# Patient Record
Sex: Male | Born: 1950 | Race: White | Hispanic: No | Smoking: Never smoker
Health system: Southern US, Community
[De-identification: ages and names within clinical notes are randomized; demographics above are authoritative.]

## PROBLEM LIST (undated history)

## (undated) DIAGNOSIS — K7010 Alcoholic hepatitis without ascites: Secondary | ICD-10-CM

## (undated) DIAGNOSIS — R42 Dizziness and giddiness: Secondary | ICD-10-CM

## (undated) DIAGNOSIS — F419 Anxiety disorder, unspecified: Secondary | ICD-10-CM

## (undated) DIAGNOSIS — I471 Supraventricular tachycardia: Secondary | ICD-10-CM

## (undated) DIAGNOSIS — I10 Essential (primary) hypertension: Secondary | ICD-10-CM

## (undated) DIAGNOSIS — F1110 Opioid abuse, uncomplicated: Secondary | ICD-10-CM

## (undated) DIAGNOSIS — Z9289 Personal history of other medical treatment: Secondary | ICD-10-CM

## (undated) DIAGNOSIS — F329 Major depressive disorder, single episode, unspecified: Secondary | ICD-10-CM

## (undated) DIAGNOSIS — J410 Simple chronic bronchitis: Secondary | ICD-10-CM

## (undated) DIAGNOSIS — E8809 Other disorders of plasma-protein metabolism, not elsewhere classified: Secondary | ICD-10-CM

## (undated) DIAGNOSIS — K219 Gastro-esophageal reflux disease without esophagitis: Secondary | ICD-10-CM

## (undated) DIAGNOSIS — J309 Allergic rhinitis, unspecified: Secondary | ICD-10-CM

## (undated) DIAGNOSIS — Z8679 Personal history of other diseases of the circulatory system: Secondary | ICD-10-CM

## (undated) DIAGNOSIS — J4550 Severe persistent asthma, uncomplicated: Secondary | ICD-10-CM

## (undated) DIAGNOSIS — M109 Gout, unspecified: Secondary | ICD-10-CM

## (undated) DIAGNOSIS — N400 Enlarged prostate without lower urinary tract symptoms: Secondary | ICD-10-CM

## (undated) DIAGNOSIS — I517 Cardiomegaly: Secondary | ICD-10-CM

## (undated) DIAGNOSIS — R9389 Abnormal findings on diagnostic imaging of other specified body structures: Secondary | ICD-10-CM

## (undated) DIAGNOSIS — F1029 Alcohol dependence with unspecified alcohol-induced disorder: Secondary | ICD-10-CM

## (undated) DIAGNOSIS — R110 Nausea: Secondary | ICD-10-CM

## (undated) DIAGNOSIS — E876 Hypokalemia: Secondary | ICD-10-CM

## (undated) DIAGNOSIS — B1920 Unspecified viral hepatitis C without hepatic coma: Secondary | ICD-10-CM

## (undated) HISTORY — DX: Gout, unspecified: M10.9

## (undated) HISTORY — DX: Supraventricular tachycardia: I47.1

## (undated) HISTORY — DX: Nausea: R11.0

## (undated) HISTORY — DX: Other disorders of plasma-protein metabolism, not elsewhere classified: E88.09

## (undated) HISTORY — DX: Alcohol dependence with unspecified alcohol-induced disorder: F10.29

## (undated) HISTORY — DX: Opioid abuse, uncomplicated: F11.10

## (undated) HISTORY — DX: Allergic rhinitis, unspecified: J30.9

## (undated) HISTORY — DX: Personal history of other medical treatment: Z92.89

## (undated) HISTORY — DX: Anxiety disorder, unspecified: F41.9

## (undated) HISTORY — DX: Essential (primary) hypertension: I10

## (undated) HISTORY — PX: OTHER SURGICAL HISTORY: SHX169

## (undated) HISTORY — DX: Benign prostatic hyperplasia without lower urinary tract symptoms: N40.0

## (undated) HISTORY — PX: UPPER GI ENDOSCOPY: SHX6162

## (undated) HISTORY — DX: Hypokalemia: E87.6

## (undated) HISTORY — DX: Alcoholic hepatitis without ascites: K70.10

## (undated) HISTORY — DX: Major depressive disorder, single episode, unspecified: F32.9

## (undated) HISTORY — DX: Cardiomegaly: I51.7

## (undated) HISTORY — DX: Dizziness and giddiness: R42

## (undated) HISTORY — DX: Abnormal findings on diagnostic imaging of other specified body structures: R93.89

## (undated) HISTORY — DX: Unspecified viral hepatitis C without hepatic coma: B19.20

## (undated) HISTORY — DX: Simple chronic bronchitis: J41.0

## (undated) HISTORY — DX: Severe persistent asthma, uncomplicated: J45.50

## (undated) HISTORY — DX: Personal history of other diseases of the circulatory system: Z86.79

## (undated) HISTORY — DX: Gastro-esophageal reflux disease without esophagitis: K21.9

---

## 2004-08-09 ENCOUNTER — Ambulatory Visit: Payer: Self-pay | Admitting: Internal Medicine

## 2004-09-20 ENCOUNTER — Ambulatory Visit: Payer: Self-pay | Admitting: Internal Medicine

## 2004-10-01 ENCOUNTER — Ambulatory Visit: Payer: Self-pay | Admitting: Internal Medicine

## 2004-11-29 ENCOUNTER — Ambulatory Visit: Payer: Self-pay | Admitting: Internal Medicine

## 2005-02-22 ENCOUNTER — Ambulatory Visit: Payer: Self-pay | Admitting: Internal Medicine

## 2005-03-08 ENCOUNTER — Ambulatory Visit: Payer: Self-pay | Admitting: Internal Medicine

## 2005-08-09 ENCOUNTER — Ambulatory Visit: Payer: Self-pay | Admitting: Internal Medicine

## 2005-10-03 ENCOUNTER — Ambulatory Visit: Payer: Self-pay | Admitting: Internal Medicine

## 2005-10-13 ENCOUNTER — Ambulatory Visit: Payer: Self-pay | Admitting: Internal Medicine

## 2006-02-02 ENCOUNTER — Ambulatory Visit: Payer: Self-pay | Admitting: Internal Medicine

## 2006-07-14 ENCOUNTER — Ambulatory Visit: Payer: Self-pay | Admitting: Internal Medicine

## 2006-09-14 DIAGNOSIS — J4550 Severe persistent asthma, uncomplicated: Secondary | ICD-10-CM

## 2006-09-14 DIAGNOSIS — F329 Major depressive disorder, single episode, unspecified: Secondary | ICD-10-CM

## 2006-09-14 DIAGNOSIS — J309 Allergic rhinitis, unspecified: Secondary | ICD-10-CM

## 2006-09-14 DIAGNOSIS — F3289 Other specified depressive episodes: Secondary | ICD-10-CM

## 2006-09-14 HISTORY — DX: Other specified depressive episodes: F32.89

## 2006-09-14 HISTORY — DX: Major depressive disorder, single episode, unspecified: F32.9

## 2006-09-14 HISTORY — DX: Severe persistent asthma, uncomplicated: J45.50

## 2006-09-14 HISTORY — DX: Allergic rhinitis, unspecified: J30.9

## 2006-10-16 ENCOUNTER — Ambulatory Visit: Payer: Self-pay | Admitting: Internal Medicine

## 2006-10-24 ENCOUNTER — Ambulatory Visit: Payer: Self-pay | Admitting: Internal Medicine

## 2006-11-01 ENCOUNTER — Encounter (INDEPENDENT_AMBULATORY_CARE_PROVIDER_SITE_OTHER): Payer: Self-pay | Admitting: *Deleted

## 2006-11-01 LAB — CONVERTED CEMR LAB
Calcium: 9.1 mg/dL (ref 8.4–10.5)
Eosinophils Absolute: 0.2 10*3/uL (ref 0.0–0.6)
Eosinophils Relative: 3.1 % (ref 0.0–5.0)
GFR calc Af Amer: 114 mL/min
GFR calc non Af Amer: 94 mL/min
Glucose, Bld: 84 mg/dL (ref 70–99)
HDL: 58.1 mg/dL (ref >39.0)
Hemoglobin: 14.4 g/dL (ref 13.0–17.0)
Lymphocytes Relative: 25.7 % (ref 12.0–46.0)
MCV: 92.6 fL (ref 78.0–100.0)
Monocytes Absolute: 0.4 10*3/uL (ref 0.2–0.7)
Monocytes Relative: 6 % (ref 3.0–11.0)
Neutro Abs: 3.8 10*3/uL (ref 1.4–7.7)
Platelets: 265 10*3/uL (ref 150–400)
Potassium: 4.2 meq/L (ref 3.5–5.1)
Triglycerides: 83 mg/dL (ref 0–149)
WBC: 5.9 10*3/uL (ref 4.5–10.5)

## 2006-12-18 ENCOUNTER — Ambulatory Visit: Payer: Self-pay | Admitting: Internal Medicine

## 2007-04-20 ENCOUNTER — Encounter (INDEPENDENT_AMBULATORY_CARE_PROVIDER_SITE_OTHER): Payer: Self-pay | Admitting: *Deleted

## 2007-05-02 ENCOUNTER — Telehealth (INDEPENDENT_AMBULATORY_CARE_PROVIDER_SITE_OTHER): Payer: Self-pay | Admitting: *Deleted

## 2007-06-27 ENCOUNTER — Telehealth (INDEPENDENT_AMBULATORY_CARE_PROVIDER_SITE_OTHER): Payer: Self-pay | Admitting: *Deleted

## 2007-08-17 ENCOUNTER — Encounter: Payer: Self-pay | Admitting: Internal Medicine

## 2007-10-11 ENCOUNTER — Encounter: Admission: RE | Admit: 2007-10-11 | Discharge: 2007-10-11 | Payer: Self-pay | Admitting: Family Medicine

## 2007-11-07 ENCOUNTER — Telehealth (INDEPENDENT_AMBULATORY_CARE_PROVIDER_SITE_OTHER): Payer: Self-pay | Admitting: *Deleted

## 2012-03-23 ENCOUNTER — Ambulatory Visit (INDEPENDENT_AMBULATORY_CARE_PROVIDER_SITE_OTHER): Payer: Self-pay | Admitting: Family Medicine

## 2012-03-23 VITALS — BP 162/90 | HR 92 | Temp 98.2°F | Resp 18 | Ht 68.5 in | Wt 157.4 lb

## 2012-03-23 DIAGNOSIS — M25569 Pain in unspecified knee: Secondary | ICD-10-CM

## 2012-03-23 DIAGNOSIS — M25469 Effusion, unspecified knee: Secondary | ICD-10-CM

## 2012-03-23 NOTE — Patient Instructions (Addendum)
I am concerned that your knee may be infected. We are going to have you see [facility] today.  Please arrive by 2pm.    Contact [facility]  [address], [place] [address] Phone: [phone] Fax: [phone]

## 2012-03-23 NOTE — Progress Notes (Signed)
[facility] [address] [place] [address] [phone]  Date:  03/23/2012   Name:  Gabriel Ramos   DOB:  [birth year]   MRN:  [id number]  PCP:  Willow Ora, MD    Chief Complaint: Knee Pain   History of Present Illness:  Gabriel Ramos is a 61 y.o. very pleasant male patient who presents with the following:  About a week ago he was cleaning something on his hands and knees. He seemed to be ok, but he next day he noted some pain and a "catch" in his left knee, and then a couple of days later the knee swelled.  The knee is getting more stiff, and is painful at night.  Last night he had a fever of 101.5.  He does not note any other body aches or chills today.  Yesterday he did feel achy, so he took his temperature.  He took tylenol last night but no antipyretics so far today No prior history of problems with his knee.   He is generally healthy.   No history of DM.   He does not have an orthopedist.   His PCP is with [facility].   Patient Active Problem List  Diagnosis  . DEPRESSION  . ALLERGIC RHINITIS  . ASTHMA    Past Medical History  Diagnosis Date  . Allergy   . Asthma     Past Surgical History  Procedure Date  . Polyps remvoed from nose     History  Substance Use Topics  . Smoking status: Never Smoker   . Smokeless tobacco: Not on file  . Alcohol Use: Yes    Family History  Problem Relation Age of Onset  . Heart murmur Mother   . Heart disease Father   . Heart disease Brother     Allergies  Allergen Reactions  . Aspirin     REACTION: ASTHMA  . Penicillins Swelling    Makes lips and tongue swell up     Medication list has been reviewed and updated.  Current Outpatient Prescriptions on File Prior to Visit  Medication Sig Dispense Refill  . albuterol (PROVENTIL) (2.5 MG/3ML) 0.083% nebulizer solution Take 2.5 mg by nebulization every 6 (six) hours as needed.      . beclomethasone (QVAR) 40 MCG/ACT inhaler Inhale 2 puffs  into the lungs 2 (two) times daily.      Marland Kitchen FLUoxetine (PROZAC) 10 MG capsule Take 10 mg by mouth daily.      . verapamil (CALAN) 40 MG tablet Take 40 mg by mouth 2 (two) times daily.        Review of Systems:  As per HPI- otherwise negative.   Physical Examination: Filed Vitals:   03/23/12 1202  BP: 162/90  Pulse: 92  Temp: 98.2 F (36.8 C)  Resp: 18   Filed Vitals:   03/23/12 1202  Height: 5' 8.5" (1.74 m)  Weight: 157 lb 6.4 oz (71.396 kg)   Body mass index is 23.58 kg/(m^2). Ideal Body Weight: Weight in (lb) to have BMI = 25: 166.5   GEN: WDWN, NAD, Non-toxic, A & O x 3, well- appearing HEENT: Atraumatic, Normocephalic. Ears and Nose: No external deformity. CV: RRR, No M/G/R. No JVD. No thrill. No extra heart sounds. PULM: CTA B, no wheezes, crackles, rhonchi. No retractions. No resp. distress. No accessory muscle use. EXTR: No c/c/e.  Left knee is warm and swollen.  Small joint effusion.  The knee is tender to anterior palpation.  Joint is stable to varus and valgus stress.  No visible injury to the skin.  Tenderness to flexion and extension of the knee joint.   NEURO favoring left knee PSYCH: Normally interactive. Conversant. Not depressed or anxious appearing.  Calm demeanor.    Assessment and Plan: 1. Knee pain   2. Knee swelling    I am concerned that Gabriel Ramos may have a left knee joint infection.  Will send to [facility] ortho who kindly agreed to see him today at Maurine Simmering, MD

## 2012-03-28 ENCOUNTER — Encounter: Payer: Self-pay | Admitting: Radiology

## 2012-03-28 DIAGNOSIS — M109 Gout, unspecified: Secondary | ICD-10-CM

## 2012-03-28 HISTORY — DX: Gout, unspecified: M10.9

## 2013-09-18 ENCOUNTER — Ambulatory Visit: Payer: Self-pay | Admitting: Interventional Cardiology

## 2013-10-29 ENCOUNTER — Ambulatory Visit: Payer: Self-pay | Admitting: Interventional Cardiology

## 2014-03-21 ENCOUNTER — Ambulatory Visit
Admission: RE | Admit: 2014-03-21 | Discharge: 2014-03-21 | Disposition: A | Discharge status: Home / Self Care | Payer: BC Managed Care – PPO | Source: Ambulatory Visit | Attending: Family Medicine | Admitting: Family Medicine

## 2014-03-21 ENCOUNTER — Other Ambulatory Visit: Payer: Self-pay | Admitting: Family Medicine

## 2014-03-21 DIAGNOSIS — R509 Fever, unspecified: Secondary | ICD-10-CM

## 2014-03-21 DIAGNOSIS — R0989 Other specified symptoms and signs involving the circulatory and respiratory systems: Secondary | ICD-10-CM

## 2014-03-21 DIAGNOSIS — R05 Cough: Secondary | ICD-10-CM

## 2014-03-21 DIAGNOSIS — R059 Cough, unspecified: Secondary | ICD-10-CM

## 2015-10-22 ENCOUNTER — Encounter (HOSPITAL_COMMUNITY): Payer: Self-pay | Admitting: *Deleted

## 2015-10-22 ENCOUNTER — Emergency Department (HOSPITAL_COMMUNITY): Payer: 59

## 2015-10-22 ENCOUNTER — Emergency Department (HOSPITAL_COMMUNITY)
Admission: EM | Admit: 2015-10-22 | Discharge: 2015-10-22 | Disposition: A | Discharge status: Home / Self Care | Payer: 59 | Attending: Emergency Medicine | Admitting: Emergency Medicine

## 2015-10-22 DIAGNOSIS — I10 Essential (primary) hypertension: Secondary | ICD-10-CM | POA: Insufficient documentation

## 2015-10-22 DIAGNOSIS — R1033 Periumbilical pain: Secondary | ICD-10-CM | POA: Diagnosis not present

## 2015-10-22 DIAGNOSIS — J45909 Unspecified asthma, uncomplicated: Secondary | ICD-10-CM | POA: Insufficient documentation

## 2015-10-22 DIAGNOSIS — R209 Unspecified disturbances of skin sensation: Secondary | ICD-10-CM | POA: Diagnosis not present

## 2015-10-22 DIAGNOSIS — R0602 Shortness of breath: Secondary | ICD-10-CM | POA: Diagnosis not present

## 2015-10-22 DIAGNOSIS — Z79899 Other long term (current) drug therapy: Secondary | ICD-10-CM | POA: Diagnosis not present

## 2015-10-22 DIAGNOSIS — R55 Syncope and collapse: Secondary | ICD-10-CM | POA: Diagnosis present

## 2015-10-22 DIAGNOSIS — Z792 Long term (current) use of antibiotics: Secondary | ICD-10-CM | POA: Insufficient documentation

## 2015-10-22 DIAGNOSIS — E876 Hypokalemia: Secondary | ICD-10-CM | POA: Insufficient documentation

## 2015-10-22 DIAGNOSIS — R05 Cough: Secondary | ICD-10-CM

## 2015-10-22 DIAGNOSIS — R202 Paresthesia of skin: Secondary | ICD-10-CM

## 2015-10-22 DIAGNOSIS — R059 Cough, unspecified: Secondary | ICD-10-CM

## 2015-10-22 DIAGNOSIS — Z7952 Long term (current) use of systemic steroids: Secondary | ICD-10-CM | POA: Insufficient documentation

## 2015-10-22 LAB — HEPATIC FUNCTION PANEL
ALT: 17 U/L (ref 17–63)
AST: 33 U/L (ref 15–41)
Albumin: 3.5 g/dL (ref 3.5–5.0)
Alkaline Phosphatase: 92 U/L (ref 38–126)
Bilirubin, Direct: 0.3 mg/dL (ref 0.1–0.5)
Indirect Bilirubin: 0.9 mg/dL (ref 0.3–0.9)
Total Bilirubin: 1.2 mg/dL (ref 0.3–1.2)
Total Protein: 5.8 g/dL — ABNORMAL LOW (ref 6.5–8.1)

## 2015-10-22 LAB — MAGNESIUM: Magnesium: 1.4 mg/dL — ABNORMAL LOW (ref 1.7–2.4)

## 2015-10-22 LAB — CBC
HCT: 41.2 % (ref 39.0–52.0)
Hemoglobin: 14.8 g/dL (ref 13.0–17.0)
MCH: 33.9 pg (ref 26.0–34.0)
MCHC: 35.9 g/dL (ref 30.0–36.0)
MCV: 94.5 fL (ref 78.0–100.0)
PLATELETS: 299 10*3/uL (ref 150–400)
RBC: 4.36 MIL/uL (ref 4.22–5.81)
RDW: 12.6 % (ref 11.5–15.5)
WBC: 6.8 10*3/uL (ref 4.0–10.5)

## 2015-10-22 LAB — BASIC METABOLIC PANEL
Anion gap: 9 (ref 5–15)
CALCIUM: 8.7 mg/dL — AB (ref 8.9–10.3)
CO2: 27 mmol/L (ref 22–32)
CREATININE: 0.6 mg/dL — AB (ref 0.61–1.24)
Chloride: 99 mmol/L — ABNORMAL LOW (ref 101–111)
GFR calc Af Amer: >60 mL/min (ref >60)
Glucose, Bld: 103 mg/dL — ABNORMAL HIGH (ref 65–99)
Potassium: 2.7 mmol/L — CL (ref 3.5–5.1)
SODIUM: 135 mmol/L (ref 135–145)

## 2015-10-22 LAB — I-STAT TROPONIN, ED
TROPONIN I, POC: 0 ng/mL (ref 0.00–0.08)
Troponin i, poc: 0.32 ng/mL (ref 0.00–0.08)

## 2015-10-22 LAB — URINALYSIS, ROUTINE W REFLEX MICROSCOPIC
BILIRUBIN URINE: NEGATIVE
GLUCOSE, UA: NEGATIVE mg/dL
Hgb urine dipstick: NEGATIVE
KETONES UR: NEGATIVE mg/dL
LEUKOCYTES UA: NEGATIVE
NITRITE: NEGATIVE
PROTEIN: NEGATIVE mg/dL
Specific Gravity, Urine: 1.009 (ref 1.005–1.030)
pH: 8.5 — ABNORMAL HIGH (ref 5.0–8.0)

## 2015-10-22 LAB — BRAIN NATRIURETIC PEPTIDE: B Natriuretic Peptide: 203 pg/mL — ABNORMAL HIGH (ref 0.0–100.0)

## 2015-10-22 LAB — TROPONIN I

## 2015-10-22 LAB — CBG MONITORING, ED: Glucose-Capillary: 85 mg/dL (ref 65–99)

## 2015-10-22 LAB — LIPASE, BLOOD: Lipase: 33 U/L (ref 11–51)

## 2015-10-22 MED ORDER — IOPAMIDOL (ISOVUE-370) INJECTION 76%: 100.0000 mL | Freq: Once | INTRAVENOUS | Status: DC | PRN

## 2015-10-22 MED ORDER — POTASSIUM CHLORIDE ER 10 MEQ PO TBCR: 10.0000 meq | EXTENDED_RELEASE_TABLET | Freq: Every day | ORAL | Status: DC

## 2015-10-22 MED ORDER — DOXYCYCLINE HYCLATE 100 MG PO TABS
100.0000 mg | ORAL_TABLET | Freq: Once | ORAL | Status: AC
  Administered 2015-10-22: 100 mg via ORAL
  Filled 2015-10-22: qty 1

## 2015-10-22 MED ORDER — DOXYCYCLINE HYCLATE 100 MG PO CAPS: 100.0000 mg | ORAL_CAPSULE | Freq: Two times a day (BID) | ORAL | Status: DC

## 2015-10-22 MED ORDER — POTASSIUM CHLORIDE CRYS ER 20 MEQ PO TBCR
40.0000 meq | EXTENDED_RELEASE_TABLET | Freq: Once | ORAL | Status: AC
  Administered 2015-10-22: 40 meq via ORAL
  Filled 2015-10-22: qty 2

## 2015-10-22 MED ORDER — MAGNESIUM SULFATE 2 GM/50ML IV SOLN
2.0000 g | Freq: Once | INTRAVENOUS | Status: AC
  Administered 2015-10-22: 2 g via INTRAVENOUS
  Filled 2015-10-22: qty 50

## 2015-10-22 MED ORDER — PREDNISONE 20 MG PO TABS: 60.0000 mg | ORAL_TABLET | Freq: Every day | ORAL | Status: DC

## 2015-10-22 MED ORDER — ONDANSETRON HCL 4 MG PO TABS: 4.0000 mg | ORAL_TABLET | Freq: Four times a day (QID) | ORAL | Status: DC

## 2015-10-22 MED ORDER — SODIUM CHLORIDE 0.9 % IV BOLUS (SEPSIS)
1000.0000 mL | Freq: Once | INTRAVENOUS | Status: AC
  Administered 2015-10-22: 1000 mL via INTRAVENOUS

## 2015-10-22 MED ORDER — POTASSIUM CHLORIDE 10 MEQ/100ML IV SOLN
10.0000 meq | INTRAVENOUS | Status: AC
  Administered 2015-10-22 (×3): 10 meq via INTRAVENOUS
  Filled 2015-10-22 (×3): qty 100

## 2015-10-22 NOTE — ED Provider Notes (Signed)
CSN: [id number]     Arrival date & time 10/22/15  1305 History   First MD Initiated Contact with Patient 10/22/15 [phone]     Chief Complaint  Patient presents with  . Loss of Consciousness     (Consider location/radiation/quality/duration/timing/severity/associated sxs/prior Treatment) HPI Comments: Patient is a 65 year old male with history of asthma, GERD, hypertension, SVT presents with syncope and change in EKG sent from PCP. Patient states he had an episode of shortness of breath and wheezing this morning which made him leave work. The patient went to his PCP who did an EKG. The patient has had episodes of numbness in his hands and face for the past few weeks. Patient has had episodes of nausea, vomiting, diarrhea that lasts up to 6 hours in the mornings for the past 6+ months. This is being worked up by his PCP and gastroenterologist. The patient had a negative CT scan at [facility] in January. The patient has never had colonoscopy or endoscopy. The patient reports associated "burning home in his stomach. "The patient has also had associated 30 pound weight loss in the past year, chills, night sweats, but no fevers. Patient denies bloody or black stools. Patient also has associated generalized fatigue and states he feels cold all the time.  Patient is a 65 y.o. male presenting with syncope. The history is provided by the patient.  Loss of Consciousness Associated symptoms: shortness of breath   Associated symptoms: no chest pain, no fever, no headaches, no nausea and no vomiting     Past Medical History  Diagnosis Date  . Allergy   . Asthma   . Hypertension   . GERD (gastroesophageal reflux disease)   . SVT (supraventricular tachycardia) ([place])     2013,echo Var[place]asi NI LV EF ,mild MR: nl stress cl 3/13   Past Surgical History  Procedure Laterality Date  . Polyps remvoed from nose     Family History  Problem Relation Age of Onset  . Heart murmur Mother   . Heart disease  Father   . Heart disease Brother    Social History  Substance Use Topics  . Smoking status: Never Smoker   . Smokeless tobacco: None  . Alcohol Use: 1.8 oz/week    3 Cans of beer per week     Comment: smirnoffs    Review of Systems  Constitutional: Positive for chills and unexpected weight change. Negative for fever.  HENT: Negative for facial swelling and sore throat.   Respiratory: Positive for shortness of breath and wheezing.   Cardiovascular: Positive for syncope. Negative for chest pain.  Gastrointestinal: Negative for nausea, vomiting, abdominal pain and blood in stool.  Genitourinary: Negative for dysuria.  Musculoskeletal: Negative for back pain.  Skin: Negative for rash and wound.  Neurological: Negative for headaches.  Psychiatric/Behavioral: The patient is not nervous/anxious.       Allergies  Aspirin and Penicillins  Home Medications   Prior to Admission medications   Medication Sig Start Date End Date Taking? Authorizing Provider  albuterol (PROVENTIL HFA;VENTOLIN HFA) 108 (90 Base) MCG/ACT inhaler Inhale 2 puffs into the lungs every 6 (six) hours as needed for wheezing or shortness of breath.   Yes Historical Provider, MD  allopurinol (ZYLOPRIM) 100 MG tablet Take 100 mg by mouth daily. 09/14/15  Yes Historical Provider, MD  FLUoxetine (PROZAC) 20 MG capsule Take 20 mg by mouth daily.   Yes Historical Provider, MD  metoprolol succinate (TOPROL-XL) 50 MG 24 hr tablet Take 50 mg  by mouth daily. 09/14/15  Yes Historical Provider, MD  montelukast (SINGULAIR) 10 MG tablet Take 10 mg by mouth daily. 09/14/15  Yes Historical Provider, MD  verapamil (CALAN-SR) 180 MG CR tablet Take 360 mg by mouth daily. 09/14/15  Yes Historical Provider, MD  beclomethasone (QVAR) 40 MCG/ACT inhaler Inhale 2 puffs into the lungs 2 (two) times daily. Reported on 10/22/2015    Historical Provider, MD  doxycycline (VIBRAMYCIN) 100 MG capsule Take 1 capsule (100 mg total) by mouth 2 (two) times  daily. 10/22/15   Becker Christopher M Keedan Sample, PA-C  ondansetron (ZOFRAN) 4 MG tablet Take 1 tablet (4 mg total) by mouth every 6 (six) hours. 10/22/15   Frederica Kuster, PA-C  potassium chloride (K-DUR) 10 MEQ tablet Take 1 tablet (10 mEq total) by mouth daily. 10/22/15   Frederica Kuster, PA-C  predniSONE (DELTASONE) 20 MG tablet Take 3 tablets (60 mg total) by mouth daily. 10/22/15   Charlsie Fleeger M Maximillion Gill, PA-C   BP 160/103 mmHg  Pulse 80  Temp(Src) 98.4 F (36.9 C) (Oral)  Resp 15  SpO2 99% Physical Exam  Constitutional: He appears well-developed and well-nourished. No distress.  HENT:  Head: Normocephalic and atraumatic.  Mouth/Throat: Oropharynx is clear and moist. No oropharyngeal exudate.  Eyes: Conjunctivae are normal. Pupils are equal, round, and reactive to light. Right eye exhibits no discharge. Left eye exhibits no discharge. No scleral icterus.  Neck: Normal range of motion. Neck supple. No thyromegaly present.  Cardiovascular: Normal rate, regular rhythm, normal heart sounds and intact distal pulses.  Exam reveals no gallop and no friction rub.   No murmur heard. Pulmonary/Chest: Effort normal. No stridor. No respiratory distress. He has wheezes (diffuse expiratory). He has no rales. He exhibits no tenderness.  Abdominal: Soft. Bowel sounds are normal. He exhibits no distension. There is tenderness in the periumbilical area. There is no rebound, no guarding, no tenderness at McBurney's point and negative Murphy's sign.    Patient is tender superior to periumbilical area where patient states he has a hernia; cavitated abdomen  Musculoskeletal: He exhibits no edema.  Lymphadenopathy:    He has no cervical adenopathy.  Neurological: He is alert. Coordination normal.  CN 3-12 intact; normal sensation throughout; 5/5 strength in all 4 extremities; equal bilateral grip strength; no ataxia on finger to nose  Skin: Skin is warm and dry. No rash noted. He is not diaphoretic. No pallor.  Psychiatric: He  has a normal mood and affect.  Nursing note and vitals reviewed.   ED Course  Procedures (including critical care time) Labs Review Labs Reviewed  BASIC METABOLIC PANEL - Abnormal; Notable for the following:    Potassium 2.7 (*)    Chloride 99 (*)    Glucose, Bld 103 (*)    BUN <5 (*)    Creatinine, Ser 0.60 (*)    Calcium 8.7 (*)    All other components within normal limits  URINALYSIS, ROUTINE W REFLEX MICROSCOPIC (NOT AT [facility]) - Abnormal; Notable for the following:    pH 8.5 (*)    All other components within normal limits  MAGNESIUM - Abnormal; Notable for the following:    Magnesium 1.4 (*)    All other components within normal limits  HEPATIC FUNCTION PANEL - Abnormal; Notable for the following:    Total Protein 5.8 (*)    All other components within normal limits  BRAIN NATRIURETIC PEPTIDE - Abnormal; Notable for the following:    B Natriuretic Peptide 203.0 (*)  All other components within normal limits  I-STAT TROPOININ, ED - Abnormal; Notable for the following:    Troponin i, poc 0.32 (*)    All other components within normal limits  CBC  LIPASE, BLOOD  TROPONIN I  CBG MONITORING, ED  Randolm Idol, ED    Imaging Review Dg Chest 2 View  10/22/2015  CLINICAL DATA:  Shortness of breath and wheezing. EXAM: CHEST  2 VIEW COMPARISON:  06/22/2015 FINDINGS: The heart size and mediastinal contours are within normal limits. Calcified granulomas are noted in the left lung. No airspace consolidation. Healed right clavicle fracture identified. IMPRESSION: No active cardiopulmonary disease. Electronically Signed   By: Kerby Moors M.D.   On: 10/22/2015 16:15   Ct Angio Chest Pe W/cm &/or Wo Cm  10/22/2015  CLINICAL DATA:  Shortness of breath, dizziness, syncope on Saturday EXAM: CT ANGIOGRAPHY CHEST WITH CONTRAST TECHNIQUE: Multidetector CT imaging of the chest was performed using the standard protocol during bolus administration of intravenous contrast. Multiplanar CT  image reconstructions and MIPs were obtained to evaluate the vascular anatomy. CONTRAST:  100 cc Isovue COMPARISON:  10/11/2007 FINDINGS: Mediastinum/Lymph Nodes: Images of the thoracic inlet are unremarkable. No mediastinal hematoma or adenopathy. Central airways are patent. No aortic aneurysm. Heart size within normal limits. No pericardial effusion. The study is of excellent technical quality. No pulmonary embolus is noted. Lungs/Pleura: No pleural thickening or pleural effusion. Stable bilateral apical pleural parenchymal scarring. No segmental infiltrate or pulmonary edema. Small calcified granuloma in left lower lobe axial image 49. Axial image 49 and 50 there is small tree-in-bud opacity and mucous plugging with subtle bronchial thickening in left lower lobe posterior medially. Mild bronchitis or inflammation/infiltrate cannot be excluded. No pneumothorax. There is no pulmonary edema. Upper abdomen: The visualized upper abdomen shows no adrenal gland mass. Visualized pancreas is unremarkable. Visualized spleen and liver is unremarkable. Musculoskeletal: No destructive bony lesions are noted. Sagittal images of the spine shows mild degenerative changes thoracic spine. Review of the MIP images confirms the above findings. IMPRESSION: 1. No pulmonary embolus is noted. 2. No mediastinal hematoma or adenopathy. 3. Axial image 49 and 50 there is small tree-in-bud opacity and mucous plugging with subtle bronchial thickening in left lower lobe posterior medially. Mild bronchitis or inflammation/infiltrate cannot be excluded. No segmental infiltrate or pulmonary edema. 4. Mild degenerative changes thoracic spine. Electronically Signed   By: Lahoma Crocker M.D.   On: 10/22/2015 17:18   I have personally reviewed and evaluated these images and lab results as part of my medical decision-making.   EKG Interpretation   Date/Time:  Thursday Oct 22 2015 13:32:07 EDT Ventricular Rate:  87 PR Interval:  158 QRS Duration:  95 QT Interval:  424 QTC Calculation: 510 R Axis:   -55 Text Interpretation:  Sinus rhythm Right atrial enlargement Left axis  deviation Low voltage, extremity leads Abnormal R-wave progression, early  transition Prolonged QT interval Baseline wander in lead(s) V2 No previous  ECGs available Confirmed by [place] ([id number]) on 10/22/2015  3:43:58 PM      MDM   EKG shows NSR, rate of trauma management, left axis deviation, abnormal R-wave progression, prolonged QT interval. Initial false i-STAT troponin of 0.32. Troponin I drawn and resulted 0.0. Repeat i-STAT troponin resulted 0.00. BMP shows potassium 2.7, chloride 99, BUN less than 5, creatinine 0.60, calcium 8.7. Magnesium 1.4. CBC unremarkable. Lipase 33. Hepatic function panel showed protein 5.8. BNP 203.0. CXR shows no active cardiopulmonary disease. CTA shows no  pulmonary embolus; no mediastinal hematoma or adenopathy; small tree-in-bud opacity and mucous plugging with subtle bronchial thickening in the left lower lobe posterior medially, mild bronchitis or limitation/infiltrate cannot be excluded; no segmental infiltrate or pulmonary edema. We replaced potassium and gave fluids in ED. Patient discharged with doxycycline to treat possible pneumonia, prednisone, oral potassium, Zofran for nausea/vomiting. Patient to follow up with PCP and gastroenterology for continued evaluation of nausea/vomiting. Patient also evaluated by Dr. Billy Fischer who is in agreement with plan. Patient discharged in satisfactory condition.  Final diagnoses:  Paresthesia of both hands  Hypokalemia  Shortness of breath  Cough       Frederica Kuster, PA-C 10/24/15 0123  Gareth Morgan, MD 10/28/15 1402

## 2015-10-22 NOTE — ED Notes (Signed)
Schlossman, EDP and Law, PA made aware of patient troponin result.

## 2015-10-22 NOTE — ED Notes (Signed)
Pt sent by PCP, Rankins, c/o weakness, wheezing, and n/v x 3 months.  Pt reported 30lbs weight loss x 3 months.  PCP reports lungs were clear after a neb treatment.  PCP noted EKG changes compared to EKG from March.  Also, Pt began c/o L side facial and arm numbness during visit, but sts it resolved prior to leaving.  Hx of asthma, SVT, anxiety and ETOH abuse.

## 2015-10-22 NOTE — ED Notes (Signed)
Pt reports he went to see his doctor today and was instructed to come to the ED d/t changed in his EKG.  Pt also reports SOB and feeling dizzy.  Pt also reports syncopal episode Saturday.  Pt denies any cp at this time.

## 2015-10-22 NOTE — Discharge Instructions (Signed)
Medications: Doxycycline, potassium, Zofran, prednisone  Treatment: Take doxycycline as prescribed for 1 week. Take prednisone as prescribed for 5 days. Take Zofran every 6 hours as needed for nausea and vomiting. Take potassium once daily for 10 days. Have her potassium rechecked by her primary care provider in 2-3 days.  Follow-up: Please follow-up with your primary care provider in 2-3 days for follow-up and recheck of your potassium and further evaluation of your chronic symptoms. Please return to emergency department if you develop any new or worsening symptoms.   Shortness of Breath Shortness of breath means you have trouble breathing. It could also mean that you have a medical problem. You should get immediate medical care for shortness of breath. CAUSES   Not enough oxygen in the air such as with high altitudes or a smoke-filled room.  Certain lung diseases, infections, or problems.  Heart disease or conditions, such as angina or heart failure.  Low red blood cells (anemia).  Poor physical fitness, which can cause shortness of breath when you exercise.  Chest or back injuries or stiffness.  Being overweight.  Smoking.  Anxiety, which can make you feel like you are not getting enough air. DIAGNOSIS  Serious medical problems can often be found during your physical exam. Tests may also be done to determine why you are having shortness of breath. Tests may include:  Chest X-rays.  Lung function tests.  Blood tests.  An electrocardiogram (ECG).  An ambulatory electrocardiogram. An ambulatory ECG records your heartbeat patterns over a 24-hour period.  Exercise testing.  A transthoracic echocardiogram (TTE). During echocardiography, sound waves are used to evaluate how blood flows through your heart.  A transesophageal echocardiogram (TEE).  Imaging scans. Your health care provider may not be able to find a cause for your shortness of breath after your exam. In this  case, it is important to have a follow-up exam with your health care provider as directed.  TREATMENT  Treatment for shortness of breath depends on the cause of your symptoms and can vary greatly. HOME CARE INSTRUCTIONS   Do not smoke. Smoking is a common cause of shortness of breath. If you smoke, ask for help to quit.  Avoid being around chemicals or things that may bother your breathing, such as paint fumes and dust.  Rest as needed. Slowly resume your usual activities.  If medicines were prescribed, take them as directed for the full length of time directed. This includes oxygen and any inhaled medicines.  Keep all follow-up appointments as directed by your health care provider. SEEK MEDICAL CARE IF:   Your condition does not improve in the time expected.  You have a hard time doing your normal activities even with rest.  You have any new symptoms. SEEK IMMEDIATE MEDICAL CARE IF:   Your shortness of breath gets worse.  You feel light-headed, faint, or develop a cough not controlled with medicines.  You start coughing up blood.  You have pain with breathing.  You have chest pain or pain in your arms, shoulders, or abdomen.  You have a fever.  You are unable to walk up stairs or exercise the way you normally do. MAKE SURE YOU:  Understand these instructions.  Will watch your condition.  Will get help right away if you are not doing well or get worse.   This information is not intended to replace advice given to you by your health care provider. Make sure you discuss any questions you have with your health care  provider.   Document Released: 02/15/2001 Document Revised: 05/28/2013 Document Reviewed: 08/08/2011 Elsevier Interactive Patient Education [employer].

## 2015-10-22 NOTE — Progress Notes (Signed)
Patient confirms he has [employer]. Patient reports he usually sees Dr. Rex Kras of [facility].  He reports Dr. Radene Ou was the first available to see today.

## 2016-04-18 ENCOUNTER — Encounter: Payer: Self-pay | Admitting: Cardiology

## 2016-05-27 ENCOUNTER — Institutional Professional Consult (permissible substitution): Payer: 59 | Admitting: Internal Medicine

## 2016-09-15 ENCOUNTER — Ambulatory Visit (INDEPENDENT_AMBULATORY_CARE_PROVIDER_SITE_OTHER): Payer: BLUE CROSS/BLUE SHIELD | Admitting: Internal Medicine

## 2016-09-15 ENCOUNTER — Other Ambulatory Visit (INDEPENDENT_AMBULATORY_CARE_PROVIDER_SITE_OTHER): Payer: BLUE CROSS/BLUE SHIELD

## 2016-09-15 ENCOUNTER — Encounter: Payer: Self-pay | Admitting: Internal Medicine

## 2016-09-15 VITALS — BP 124/64 | HR 72 | Ht 67.5 in | Wt 133.8 lb

## 2016-09-15 DIAGNOSIS — I10 Essential (primary) hypertension: Secondary | ICD-10-CM

## 2016-09-15 DIAGNOSIS — R9389 Abnormal findings on diagnostic imaging of other specified body structures: Secondary | ICD-10-CM

## 2016-09-15 DIAGNOSIS — J4550 Severe persistent asthma, uncomplicated: Secondary | ICD-10-CM | POA: Diagnosis not present

## 2016-09-15 DIAGNOSIS — R938 Abnormal findings on diagnostic imaging of other specified body structures: Secondary | ICD-10-CM | POA: Diagnosis not present

## 2016-09-15 LAB — CBC WITH DIFFERENTIAL/PLATELET
BASOS PCT: 0.2 % (ref 0.0–3.0)
Basophils Absolute: 0 10*3/uL (ref 0.0–0.1)
EOS ABS: 0 10*3/uL (ref 0.0–0.7)
EOS PCT: 0 % (ref 0.0–5.0)
HEMATOCRIT: 41.8 % (ref 39.0–52.0)
Hemoglobin: 14 g/dL (ref 13.0–17.0)
LYMPHS PCT: 8.7 % — AB (ref 12.0–46.0)
Lymphs Abs: 1.2 10*3/uL (ref 0.7–4.0)
MCHC: 33.4 g/dL (ref 30.0–36.0)
MCV: 97 fl (ref 78.0–100.0)
Monocytes Absolute: 0.9 10*3/uL (ref 0.1–1.0)
Monocytes Relative: 6.5 % (ref 3.0–12.0)
Neutro Abs: 11.6 10*3/uL — ABNORMAL HIGH (ref 1.4–7.7)
Neutrophils Relative %: 84.6 % — ABNORMAL HIGH (ref 43.0–77.0)
Platelets: 418 10*3/uL — ABNORMAL HIGH (ref 150.0–400.0)
RBC: 4.31 Mil/uL (ref 4.22–5.81)
RDW: 14.5 % (ref 11.5–15.5)
WBC: 13.7 10*3/uL — ABNORMAL HIGH (ref 4.0–10.5)

## 2016-09-15 MED ORDER — ONDANSETRON HCL 4 MG PO TABS: 4.0000 mg | ORAL_TABLET | Freq: Four times a day (QID) | ORAL | 0 refills | Status: AC

## 2016-09-15 MED ORDER — BISOPROLOL FUMARATE 5 MG PO TABS: 5.0000 mg | ORAL_TABLET | Freq: Every day | ORAL | 11 refills | Status: DC

## 2016-09-15 MED ORDER — PANTOPRAZOLE SODIUM 40 MG PO TBEC: 40.0000 mg | DELAYED_RELEASE_TABLET | Freq: Every day | ORAL | 2 refills | Status: DC

## 2016-09-15 MED ORDER — BUDESONIDE-FORMOTEROL FUMARATE 160-4.5 MCG/ACT IN AERO
2.0000 | INHALATION_SPRAY | Freq: Two times a day (BID) | RESPIRATORY_TRACT | 0 refills | Status: DC
Start: 2016-09-15 — End: 2016-10-17

## 2016-09-15 MED ORDER — METHYLPREDNISOLONE 4 MG PO TABS: ORAL_TABLET | ORAL | 0 refills | Status: DC

## 2016-09-15 MED ORDER — ALBUTEROL SULFATE (2.5 MG/3ML) 0.083% IN NEBU: 2.5000 mg | INHALATION_SOLUTION | Freq: Four times a day (QID) | RESPIRATORY_TRACT | 1 refills | Status: AC | PRN

## 2016-09-15 MED ORDER — FAMOTIDINE 20 MG PO TABS: ORAL_TABLET | ORAL | 2 refills | Status: DC

## 2016-09-15 NOTE — Patient Instructions (Addendum)
Medrol 4 mg  X 4 daily until better then 2 daily x 4 days, 1 daily x 4 days and stop - generally taken with breakfast  Stop metaprolol and start bisoprolol 5 mg daily   Pantoprazole (protonix) 40 mg   Take  30-60 min before first meal of the day and Pepcid (famotidine)  20 mg one @  bedtime until return to office - this is the best way to tell whether stomach acid is contributing to your problem.    Plan A = Automatic =   Stop qvar amd continue singulair 10 mg daily and  Symbicort 160 Take 2 puffs first thing in am and then another 2 puffs about 12 hours later   Work on inhaler technique:  relax and gently blow all the way out then take a nice smooth deep breath back in, triggering the inhaler at same time you start breathing in.  Hold for up to 5 seconds if you can. Blow out thru nose. Rinse and gargle with water when done    Plan B = Backup Only use your albuterol (proair) as a rescue medication to be used if you can't catch your breath by resting or doing a relaxed purse lip breathing pattern.  - The less you use it, the better it will work when you need it. - Ok to use the inhaler up to 2 puffs  every 4 hours if you must but call for appointment if use goes up over your usual need - Don't leave home without it !!  (think of it like the spare tire for your car)   Plan C = Crisis - only use your albuterol nebulizer if you first try Plan B and it fails to help > ok to use the nebulizer up to every 4 hours but if start needing it regularly call for immediate appointment   Please remember to go to the lab   department downstairs in the basement  for your tests - we will call you with the results when they are available.   Please schedule a follow up office visit in 4 weeks, sooner if needed  with all medications /inhalers/ solutions in hand so we can verify exactly what you are taking. This includes all medications from all doctors and over the counters Add : f/u cxr next ov for baseline

## 2016-09-15 NOTE — Progress Notes (Signed)
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  Also worse when vomits and has chronic dyspepsia but not on gerd rx   No obvious day to day or daytime variability or assoc excess/ purulent sputum or mucus plugs or hemoptysis or cp  overt sinus or hb symptoms. No unusual exp hx or h/o childhood pna/ asthma or knowledge of premature birth.  Sleeping ok without nocturnal  or early am exacerbation  of respiratory  c/o's or need for noct saba. Also denies any obvious fluctuation of symptoms with weather or environmental changes or other aggravating or alleviating factors except as outlined above   Current Medications, Allergies, Complete Past Medical History, Past Surgical History, Family History, and Social History were reviewed in [employer] record.  ROS  The following are not active complaints unless bolded sore throat, dysphagia, dental problems, itching, sneezing,   nasal congestion or excess/ purulent secretions, ear ache,   fever, chills, sweats, unintended wt loss, classically pleuritic or exertional cp,  orthopnea pnd or leg swelling, presyncope, palpitations, abdominal pain, anorexia, nausea, vomiting though not in last few weeks, diarrhea  or change in bowel or bladder habits, change in stools or urine, dysuria,hematuria,  rash, arthralgias, visual complaints, headache, numbness, weakness or ataxia or problems with walking or coordination,  change in mood/affect or memory.            Review of Systems  Constitutional: Negative for fever and unexpected weight change.  HENT: Negative for congestion, dental problem, ear pain, nosebleeds, postnasal drip, rhinorrhea, sinus pressure, sneezing, sore throat and trouble swallowing.   Eyes: Negative for redness and itching.  Respiratory: Positive for chest tightness, shortness of breath and wheezing. Negative for cough.   Cardiovascular: Negative for palpitations and leg swelling.  Gastrointestinal: Negative for nausea and vomiting.  Genitourinary: Negative for dysuria.  Musculoskeletal: Negative for joint swelling.  Skin: Negative for rash.  Neurological: Negative for headaches.  Hematological: Does not bruise/bleed easily.  Psychiatric/Behavioral: Negative for dysphoric mood. The patient is not nervous/anxious.        Objective:   Physical Exam   amb tense wm nad   Wt Readings from Last 3 Encounters:  09/15/16 133 lb 12.8 oz (60.7 kg)  03/23/12 157 lb 6.4 oz (71.4 kg)  12/18/06 160 lb 9.6 oz (72.8 kg)    Vital signs reviewed - Note on  arrival 02 sats  97% on RA      HEENT: nl dentition, turbinates bilaterally- no viz polyps -  And nl  oropharynx. Nl external ear canals without cough reflex   NECK :  without JVD/Nodes/TM/ nl carotid upstrokes bilaterally   LUNGS: no acc muscle use,  slt barrel contour with pan exp wheeze bilaterally - poor air movement overall   CV:  RRR  no s3 or  murmur or increase in P2, and no edema   ABD:  soft and nontender with nl inspiratory excursion in the supine position. No bruits or organomegaly appreciated, bowel sounds nl  MS:  Nl gait/ ext warm without deformities, calf tenderness, cyanosis or clubbing No obvious joint restrictions   SKIN: warm and dry without lesions    NEURO:  alert, approp, nl sensorium with  no motor or cerebellar deficits apparent.     cxr report reviewed 09/07/16 :  LLL granulma, o/w nad    Labs ordered 09/15/2016  Allergy profile      Assessment & Plan:

## 2016-09-16 DIAGNOSIS — I10 Essential (primary) hypertension: Secondary | ICD-10-CM | POA: Insufficient documentation

## 2016-09-16 DIAGNOSIS — R9389 Abnormal findings on diagnostic imaging of other specified body structures: Secondary | ICD-10-CM

## 2016-09-16 HISTORY — DX: Abnormal findings on diagnostic imaging of other specified body structures: R93.89

## 2016-09-16 LAB — RESPIRATORY ALLERGY PROFILE REGION II ~~LOC~~
Allergen, Cedar tree, t12: <0.1 kU/L
Allergen, Cottonwood, t14: <0.1 kU/L
Allergen, D pternoyssinus,d7: <0.1 kU/L
Allergen, Mouse Urine Protein, e78: <0.1 kU/L
Allergen, P. notatum, m1: <0.1 kU/L
Aspergillus fumigatus, m3: <0.1 kU/L
Bermuda Grass: 1 kU/L — ABNORMAL HIGH
Cat Dander: <0.1 kU/L
Common Ragweed: <0.1 kU/L
D. farinae: <0.1 kU/L
DOG DANDER: 0.34 kU/L — AB
IGE (IMMUNOGLOBULIN E), SERUM: 271 kU/L — AB (ref <115)
JOHNSON GRASS: 0.61 kU/L — AB
Pecan/Hickory Tree IgE: <0.1 kU/L
Timothy Grass: 2.41 kU/L — ABNORMAL HIGH

## 2016-09-16 NOTE — Assessment & Plan Note (Addendum)
Allergy profile 09/15/2016 >  Eos 0. /  IgE   - 09/15/2016  After extensive coaching HFA effectiveness =    75% > try symbicort 160 2bid   DDX of  difficult airways management almost all start with A and  include Adherence, Ace Inhibitors, Acid Reflux, Active Sinus Disease, Alpha 1 Antitripsin deficiency, Anxiety masquerading as Airways dz,  ABPA,  Allergy(esp in young), Aspiration (esp in elderly), Adverse effects of meds,  Active smokers, A bunch of PE's (a small clot burden can't cause this syndrome unless there is already severe underlying pulm or vascular dz with poor reserve) plus two Bs  = Bronchiectasis and Beta blocker use..and one C= CHF   In this case Adherence is the biggest issue and starts with  inability to use HFA effectively and also  understand that SABA treats the symptoms but doesn't get to the underlying problem (inflammation).  I used  the analogy of putting steroid cream on a rash to help explain the meaning of topical therapy and the need to get the drug to the target tissue.   - needs to return with all meds in hand using a trust but verify approach to confirm accurate Medication  Reconciliation The principal here is that until we are certain that the  patients are doing what we've asked, it makes no sense to ask them to do more.   ? Acid (or non-acid) GERD > always difficult to exclude as up to 75% of pts in some series report no assoc GI/ Heartburn symptoms> rec max (24h)  acid suppression and diet restrictions/ reviewed and instructions given in writing.   ? Allergy > aeroallergies not typically seen with polyps but can be and  clearly allergic to ASA > repeat profile/ consider asa desensitization per Kozlow's protocol if not responding to rx and in meantime change pred to medrol as he may be pred resistant/ slow taper off  ? Anxiety playing a role in non- adherence?   ? Active sinus dz/ h/o polyps > low threshold to repeat CT but not needed for now  ? Bronchiectasis ruled  out by CT 10/22/15  ? BB effects > see hbp   Total time devoted to counseling  > 50 % of initial 60 min office visit:  review case with pt/wife discussion of options/alternatives/ personally creating written customized instructions  in presence of pt  then going over those specific  Instructions directly with the pt including how to use all of the meds but in particular covering each new medication in detail and the difference between the maintenance= "automatic" meds and the prns using an action plan format for the latter (If this problem/symptom => do that organization reading Left to right).  Please see AVS from this visit for a full list of these instructions which I personally wrote for this pt and  are unique to this visit.

## 2016-09-16 NOTE — Assessment & Plan Note (Signed)
Most likely this is granuloma or other benign finding as suggested by radiology noting he's had 2 ct chests in last 2 years and never smoked I don't believe we need to aggressively eval this problem but first address the severe airway  Issues.   Discussed in detail all the  indications, usual  risks and alternatives  relative to the benefits with patient who agrees to proceed with conservative f/u as outlined

## 2016-09-16 NOTE — Assessment & Plan Note (Signed)
Changed lopressor to bisoprolol 5 mg daily due to refractory wheezing  NB :  Strongly prefer in this setting: Bystolic, the most beta -1  selective Beta blocker available in sample form, with bisoprolol the most selective generic choice  on the market.

## 2016-09-19 ENCOUNTER — Encounter: Payer: Self-pay | Admitting: *Deleted

## 2016-09-19 NOTE — Progress Notes (Signed)
ATC, number has been changed or d/c'ed

## 2016-09-20 NOTE — Progress Notes (Signed)
Spoke with pt and notified of results per Dr. Wert. Pt verbalized understanding and denied any questions. 

## 2016-10-10 ENCOUNTER — Institutional Professional Consult (permissible substitution): Payer: 59 | Admitting: Internal Medicine

## 2016-10-17 ENCOUNTER — Ambulatory Visit (INDEPENDENT_AMBULATORY_CARE_PROVIDER_SITE_OTHER): Payer: BLUE CROSS/BLUE SHIELD | Admitting: Internal Medicine

## 2016-10-17 ENCOUNTER — Encounter: Payer: Self-pay | Admitting: Internal Medicine

## 2016-10-17 ENCOUNTER — Other Ambulatory Visit (INDEPENDENT_AMBULATORY_CARE_PROVIDER_SITE_OTHER): Payer: BLUE CROSS/BLUE SHIELD

## 2016-10-17 ENCOUNTER — Ambulatory Visit (INDEPENDENT_AMBULATORY_CARE_PROVIDER_SITE_OTHER)
Admission: RE | Admit: 2016-10-17 | Discharge: 2016-10-17 | Disposition: A | Discharge status: Home / Self Care | Payer: BLUE CROSS/BLUE SHIELD | Source: Ambulatory Visit | Attending: Internal Medicine | Admitting: Internal Medicine

## 2016-10-17 VITALS — BP 136/80 | HR 106 | Ht 70.0 in | Wt 134.0 lb

## 2016-10-17 DIAGNOSIS — J4550 Severe persistent asthma, uncomplicated: Secondary | ICD-10-CM | POA: Diagnosis not present

## 2016-10-17 DIAGNOSIS — J069 Acute upper respiratory infection, unspecified: Secondary | ICD-10-CM | POA: Diagnosis not present

## 2016-10-17 LAB — CBC WITH DIFFERENTIAL/PLATELET
Basophils Absolute: 0.1 10*3/uL (ref 0.0–0.1)
Basophils Relative: 0.6 % (ref 0.0–3.0)
EOS PCT: 3 % (ref 0.0–5.0)
Eosinophils Absolute: 0.5 10*3/uL (ref 0.0–0.7)
HEMATOCRIT: 39.3 % (ref 39.0–52.0)
HEMOGLOBIN: 13.5 g/dL (ref 13.0–17.0)
LYMPHS PCT: 9.3 % — AB (ref 12.0–46.0)
Lymphs Abs: 1.5 10*3/uL (ref 0.7–4.0)
MCHC: 34.3 g/dL (ref 30.0–36.0)
MCV: 97.9 fl (ref 78.0–100.0)
MONO ABS: 1 10*3/uL (ref 0.1–1.0)
MONOS PCT: 5.8 % (ref 3.0–12.0)
Neutro Abs: 13.5 10*3/uL — ABNORMAL HIGH (ref 1.4–7.7)
Neutrophils Relative %: 81.3 % — ABNORMAL HIGH (ref 43.0–77.0)
Platelets: 314 10*3/uL (ref 150.0–400.0)
RBC: 4.02 Mil/uL — AB (ref 4.22–5.81)
RDW: 14.3 % (ref 11.5–15.5)
WBC: 16.7 10*3/uL — AB (ref 4.0–10.5)

## 2016-10-17 MED ORDER — ALBUTEROL SULFATE HFA 108 (90 BASE) MCG/ACT IN AERS: 2.0000 | INHALATION_SPRAY | Freq: Four times a day (QID) | RESPIRATORY_TRACT | 3 refills | Status: DC | PRN

## 2016-10-17 MED ORDER — CLOTRIMAZOLE 10 MG MT TROC: OROMUCOSAL | 2 refills | Status: DC

## 2016-10-17 MED ORDER — AZITHROMYCIN 250 MG PO TABS: ORAL_TABLET | ORAL | 0 refills | Status: DC

## 2016-10-17 MED ORDER — BUDESONIDE-FORMOTEROL FUMARATE 160-4.5 MCG/ACT IN AERO: 2.0000 | INHALATION_SPRAY | Freq: Two times a day (BID) | RESPIRATORY_TRACT | 0 refills | Status: DC

## 2016-10-17 NOTE — Patient Instructions (Addendum)
Please remember to go to the lab and x-ray department downstairs in the basement  for your tests - we will call you with the results when they are available.    zpak  Clotrimazole troch 10 mg four times a day as needed for sore throat and tongue   See Tammy NP w/in 2 weeks with Patty and a 4 slot per day container  all your medications, even over the counter meds, separated in two separate bags, the ones you take no matter what vs the ones you stop once you feel better and take only as needed when you feel you need them.   Tammy  will generate for you a new user friendly medication calendar that will put [place] all on the same page re: your medication use.     Without this process, it simply isn't possible to assure that we are providing  your outpatient care  with  the attention to detail we feel you deserve.   If we cannot assure that you're getting that kind of care,  then we cannot manage your problem effectively from this clinic.  Once you have seen Tammy and we are sure that we're all on the same page with your medication use she will arrange follow up with me.  Add:  Needs full pfts on return as well as sinus CT

## 2016-10-17 NOTE — Progress Notes (Signed)
Spoke with pt and notified of results per Dr. Wert. Pt verbalized understanding and denied any questions. 

## 2016-10-17 NOTE — Assessment & Plan Note (Signed)
Allergy profile 09/15/2016 >  Eos 0.0/  IgE  271 Grass > dog  - 09/15/2016    try symbicort 160 2bid  - 10/17/2016  After extensive coaching HFA effectiveness =    75%  (ti too short)   Still has dog in house and poor insight into use of maint vs prns but at least no longer so dependent on saba neb > no change maint rx needed for now/ we'll see how he does when totally weans off steroids  I had an extended discussion with the patient reviewing all relevant studies completed to date and  lasting 15 to 20 minutes of a 25 minute visit.    Struggling with concept of med reconciliation, depending on wife to fill the pillbox which is just one slot per day.  To keep things simple, I have asked the patient to first separate medicines that are perceived as maintenance, that is to be taken daily "no matter what", from those medicines that are taken on only on an as-needed basis and I have given the patient examples of both, and then return to see our NP to generate a  detailed  medication calendar which should be followed until the next physician sees the patient and updates it.     Each maintenance medication was reviewed in detail including most importantly the difference between maintenance and prns and under what circumstances the prns are to be triggered using an action plan format that is not reflected in the computer generated alphabetically organized AVS.    Please see AVS for specific instructions unique to this visit that I personally wrote and verbalized to the the pt in detail and then reviewed with pt  by my nurse highlighting any  changes in therapy recommended at today's visit to their plan of care.

## 2016-10-17 NOTE — Assessment & Plan Note (Signed)
?   Viral ? Symptoms from thrush but certainly not a significant bacterial infection at this point   rec  Zpak/ clotrimazole Consider Sinus CT if symptoms persist as does have h/o nasal polyps

## 2016-10-17 NOTE — Progress Notes (Signed)
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  Also worse when vomits and has chronic dyspepsia but not on gerd rx  rec Medrol 4 mg  X 4 daily until better then 2 daily x 4 days, 1 daily x 4 days and stop - generally taken with breakfast Stop metaprolol and start bisoprolol 5 mg daily  Pantoprazole (protonix) 40 mg   Take  30-60 min before first meal of the day and Pepcid (famotidine)  20 mg one @  bedtime until return to office - this is the best way to tell whether stomach acid is contributing to your problem.   Plan A = Automatic =   Stop qvar amd continue singulair 10 mg daily and  Symbicort 160 Take 2 puffs first thing in am and then another 2 puffs about 12 hours later  Plan B = Backup Only use your albuterol (proair) as a rescue medication   Plan C = Crisis - only use your albuterol nebulizer if you first try Plan B and it fails to help > ok to use the nebulizer up to every 4 hours  but if start needing it regularly call for immediate appointment Please remember to go to the lab   department downstairs in the basement  for your tests - we will call you with the results when they are available. Please schedule a follow up office visit in 4 weeks, sooner if needed  with all medications /inhalers/ solutions in hand so we can verify exactly what you are taking. This includes all medications from all doctors and over the counters     10/17/2016  f/u ov/Gabriel Ramos re: triad asthma  symbicort 160 2bid / proair avg 2 pffs x 4 x daily (was 20) and medrol 4 mg one daily  Chief Complaint  Patient presents with  . Follow-up    Breathing is unchanged. He c/o sore throat, fatigue and non prod cough x 2 days.   was doing better prior to acute worsening x 48 h dry raspy cough/ sorethroat and tongue/ fatigue but no fever, aches n or V Doe = MMRC2 = can't walk a nl pace on a flat grade s sob but does fine slow and flat   No obvious day to day or daytime variability or assoc excess/ purulent sputum or mucus plugs or hemoptysis or cp or chest tightness, subjective wheeze  or overt sinus or hb symptoms. No unusual exp hx or h/o childhood pna/ asthma or knowledge of premature birth.  Sleeping ok without nocturnal  or early am exacerbation  of respiratory  c/o's or need for noct saba. Also denies any obvious fluctuation of symptoms with weather or environmental changes or other aggravating or alleviating factors except as outlined above   Current Medications, Allergies, Complete Past Medical History, Past Surgical History, Family History, and Social History were reviewed in [employer] record.  ROS  The following are not active complaints unless bolded sore throat, dysphagia, dental problems, itching, sneezing,  nasal congestion or excess/ purulent secretions, ear ache,   fever, chills, sweats, unintended wt loss, classically pleuritic or exertional cp,  orthopnea pnd or leg  swelling, presyncope, palpitations, abdominal pain, anorexia, nausea, vomiting, diarrhea  or change in bowel or bladder habits, change in stools or urine, dysuria,hematuria,  rash, arthralgias, visual complaints, headache, numbness, weakness or ataxia or problems with walking or coordination,  change in mood/affect or memory.               Objective:   Physical Exam   amb  wm nad    10/17/2016      134   09/15/16 133 lb 12.8 oz (60.7 kg)  03/23/12 157 lb 6.4 oz (71.4 kg)  12/18/06 160 lb 9.6 oz (72.8 kg)    Vital signs reviewed - Note on arrival 02 sats  97% on RA      HEENT: nl dentition, moderate bilateral non-specific turbinate edema  but no viz polyps -  And    Oropharynx with moderate thrush. Nl external ear canals without cough reflex   NECK :  without JVD/Nodes/TM/ nl carotid upstrokes bilaterally   LUNGS: no acc muscle use,  slt barrel contour  With distant scattered insp and exp wheeze but better air movement than prior    CV:  RRR  no s3 or murmur or increase in P2, and no edema   ABD:  soft and nontender with nl inspiratory excursion in the supine position. No bruits or organomegaly appreciated, bowel sounds nl  MS:  Nl gait/ ext warm without deformities, calf tenderness, cyanosis or clubbing No obvious joint restrictions   SKIN: warm and dry without lesions    NEURO:  alert, approp, nl sensorium with  no motor or cerebellar deficits apparent.     CXR PA and Lateral:   10/17/2016 :    I personally reviewed images and agree with radiology impression as follows:   Trachea is midline. Heart size normal. Calcified granuloma in the left midlung zone. Left apical pleuroparenchymal scarring. Lungs are may be minimally hyperinflated but are otherwise clear. No pleural fluid. Old right clavicle fracture. Question old left humeral neck Fracture.     Lab Results  Component Value Date   WBC 16.7 (H) 10/17/2016   HGB 13.5 10/17/2016   HCT 39.3 10/17/2016   MCV  97.9 10/17/2016   PLT 314.0 10/17/2016       Eos                         0.5          Assessment & Plan:

## 2016-10-18 ENCOUNTER — Telehealth: Payer: Self-pay | Admitting: Internal Medicine

## 2016-10-18 NOTE — Telephone Encounter (Signed)
Spoke with Pt's spouse Chong Sicilian, who is wanting clarification on med calendar. Pt states each one of pt's medication bottles say take daily. Patty feels that Lynelle Smoke is doing to arrange pt's medications just has she has them. I have explained why MW scheduled a med calender is to ensure that pt is taking his medications as ordered. Patty voiced her understanding and had no further questions. Nothing further needed.

## 2016-10-28 ENCOUNTER — Other Ambulatory Visit: Payer: Self-pay | Admitting: Internal Medicine

## 2016-10-28 DIAGNOSIS — J4550 Severe persistent asthma, uncomplicated: Secondary | ICD-10-CM

## 2016-10-28 MED ORDER — PANTOPRAZOLE SODIUM 40 MG PO TBEC: 40.0000 mg | DELAYED_RELEASE_TABLET | Freq: Every day | ORAL | 1 refills | Status: DC

## 2016-10-28 MED ORDER — FAMOTIDINE 20 MG PO TABS: ORAL_TABLET | ORAL | 1 refills | Status: DC

## 2016-11-07 ENCOUNTER — Encounter: Payer: BLUE CROSS/BLUE SHIELD | Admitting: Adult Health

## 2016-11-17 ENCOUNTER — Encounter: Payer: BLUE CROSS/BLUE SHIELD | Admitting: Adult Health

## 2016-11-23 ENCOUNTER — Other Ambulatory Visit: Payer: Self-pay | Admitting: Gastroenterology

## 2016-11-23 DIAGNOSIS — R160 Hepatomegaly, not elsewhere classified: Secondary | ICD-10-CM

## 2016-11-23 DIAGNOSIS — F1020 Alcohol dependence, uncomplicated: Secondary | ICD-10-CM

## 2016-11-30 ENCOUNTER — Ambulatory Visit
Admission: RE | Admit: 2016-11-30 | Discharge: 2016-11-30 | Disposition: A | Discharge status: Home / Self Care | Payer: BLUE CROSS/BLUE SHIELD | Source: Ambulatory Visit | Attending: Gastroenterology | Admitting: Gastroenterology

## 2016-11-30 DIAGNOSIS — F1020 Alcohol dependence, uncomplicated: Secondary | ICD-10-CM

## 2016-11-30 DIAGNOSIS — R160 Hepatomegaly, not elsewhere classified: Secondary | ICD-10-CM

## 2017-01-10 ENCOUNTER — Other Ambulatory Visit: Payer: Self-pay | Admitting: Internal Medicine

## 2017-01-10 ENCOUNTER — Telehealth: Payer: Self-pay | Admitting: Internal Medicine

## 2017-01-10 MED ORDER — NEBULIZER COMPRESSOR MISC
0 refills | Status: DC
Start: 2017-01-10 — End: 2017-01-10

## 2017-01-10 MED ORDER — NEBULIZER COMPRESSOR MISC: 0 refills | Status: AC

## 2017-01-10 NOTE — Telephone Encounter (Signed)
Received a fax from Dr Lennette Bihari Little's office stating that the pt is in need of a new neb machine and has been unable to reach the office. I called the pt to confirm this. He states he tried calling us on 01/09/17 am- the power was out so the phones were not working. I apologized for the inconvenience and have sent him an order for a new neb machine to the [facility] in [place] per his request. I advised he is overdue for f/u, and will need appt for future medication refills. He will call later to schedule this. Nothing further needed.

## 2017-02-20 ENCOUNTER — Telehealth: Payer: Self-pay | Admitting: Internal Medicine

## 2017-02-20 MED ORDER — ALBUTEROL SULFATE HFA 108 (90 BASE) MCG/ACT IN AERS: 2.0000 | INHALATION_SPRAY | Freq: Four times a day (QID) | RESPIRATORY_TRACT | 2 refills | Status: DC | PRN

## 2017-02-20 NOTE — Telephone Encounter (Signed)
Spoke with the pt and notified that Proair was refilled  He states nothing further

## 2017-02-22 ENCOUNTER — Ambulatory Visit (INDEPENDENT_AMBULATORY_CARE_PROVIDER_SITE_OTHER): Payer: BLUE CROSS/BLUE SHIELD | Admitting: Internal Medicine

## 2017-02-22 ENCOUNTER — Encounter: Payer: Self-pay | Admitting: Internal Medicine

## 2017-02-22 VITALS — BP 106/60 | HR 79 | Ht 70.0 in | Wt 131.8 lb

## 2017-02-22 DIAGNOSIS — J4550 Severe persistent asthma, uncomplicated: Secondary | ICD-10-CM | POA: Diagnosis not present

## 2017-02-22 MED ORDER — BUDESONIDE-FORMOTEROL FUMARATE 160-4.5 MCG/ACT IN AERO: 2.0000 | INHALATION_SPRAY | Freq: Two times a day (BID) | RESPIRATORY_TRACT | 0 refills | Status: DC

## 2017-02-22 MED ORDER — BUDESONIDE-FORMOTEROL FUMARATE 160-4.5 MCG/ACT IN AERO: 2.0000 | INHALATION_SPRAY | Freq: Two times a day (BID) | RESPIRATORY_TRACT | 11 refills | Status: DC

## 2017-02-22 MED ORDER — PREDNISONE 10 MG PO TABS: ORAL_TABLET | ORAL | 1 refills | Status: DC

## 2017-02-22 NOTE — Patient Instructions (Addendum)
Prednisone 10 mg  Take two each am until better then one daily until return   Plan A = Automatic = symbicort 160  Take 2 puffs first thing in am and then another 2 puffs about 12 hours later.    Work on inhaler technique:  relax and gently blow all the way out then take a nice smooth deep breath back in, triggering the inhaler at same time you start breathing in.  Hold for up to 5 seconds if you can. Blow out thru nose. Rinse and gargle with water when done     Plan B = Backup Only use your albuterol as a rescue medication to be used if you can't catch your breath by resting or doing a relaxed purse lip breathing pattern.  - The less you use it, the better it will work when you need it. - Ok to use the inhaler up to 2 puffs  every 4 hours if you must but call for appointment if use goes up over your usual need - Don't leave home without it !!  (think of it like the spare tire for your car)    Plan C = Crisis - only use your albuterol nebulizer if you first try Plan B and it fails to help > ok to use the nebulizer up to every 4 hours but if start needing it regularly call for immediate appointment  GERD (REFLUX)  is an extremely common cause of respiratory symptoms just like yours , many times with no obvious heartburn at all.    It can be treated with medication, but also with lifestyle changes including elevation of the head of your bed (ideally with 6 inch  bed blocks),  Smoking cessation, avoidance of late meals, excessive alcohol, and avoid fatty foods, chocolate, peppermint, colas, red wine, and acidic juices such as orange juice.  NO MINT OR MENTHOL PRODUCTS SO NO COUGH DROPS  USE SUGARLESS CANDY INSTEAD (Jolley ranchers or Stover's or Life Savers) or even ice chips will also do - the key is to swallow to prevent all throat clearing. NO OIL BASED VITAMINS - use powdered substitutes.     See Tammy NP w/in 2 weeks (or first available)  with all your medications, even over the counter  meds, separated in two separate bags, the ones you take no matter what vs the ones you stop once you feel better and take only as needed when you feel you need them.   Tammy  will generate for you a new user friendly medication calendar that will put [place] all on the same page re: your medication use.     Without this process, it simply isn't possible to assure that we are providing  your outpatient care  with  the attention to detail we feel you deserve.   If we cannot assure that you're getting that kind of care,  then we cannot manage your problem effectively from this clinic.   Please see patient coordinator before you leave today  to schedule sinus CT       Once you have seen Tammy and we are sure that we're all on the same page with your medication use she will arrange follow up with me.

## 2017-02-22 NOTE — Progress Notes (Signed)
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  Also worse when vomits and has chronic dyspepsia but not on gerd rx  rec Medrol 4 mg  X 4 daily until better then 2 daily x 4 days, 1 daily x 4 days and stop - generally taken with breakfast Stop metaprolol and start bisoprolol 5 mg daily  Pantoprazole (protonix) 40 mg   Take  30-60 min before first meal of the day and Pepcid (famotidine)  20 mg one @  bedtime until return to office - this is the best way to tell whether stomach acid is contributing to your problem.   Plan A = Automatic =   Stop qvar amd continue singulair 10 mg daily and  Symbicort 160 Take 2 puffs first thing in am and then another 2 puffs about 12 hours later  Plan B = Backup Only use your albuterol (proair) as a rescue medication   Plan C = Crisis - only use your albuterol nebulizer if you first try Plan B and it fails  to help > ok to use the nebulizer up to every 4 hours but if start needing it regularly call for immediate appointment Please remember to go to the lab   department downstairs in the basement  for your tests - we will call you with the results when they are available. Please schedule a follow up office visit in 4 weeks, sooner if needed  with all medications /inhalers/ solutions in hand so we can verify exactly what you are taking. This includes all medications from all doctors and over the counters     10/17/2016  f/u ov/Gabriel Ramos re: triad asthma  symbicort 160 2bid / proair avg 2 pffs x 4 x daily (was 20) and medrol 4 mg one daily  Chief Complaint  Patient presents with  . Follow-up    Breathing is unchanged. He c/o sore throat, fatigue and non prod cough x 2 days.   was doing better prior to acute worsening x 48 h dry raspy cough/ sorethroat and tongue/ fatigue but no fever, aches n or V Doe = MMRC2 = can't walk a nl pace on a flat grade s sob but does fine slow and flat  rec Please remember to go to the lab and x-ray department downstairs in the basement  for your tests - we will call you with the results when they are available. zpak Clotrimazole troch 10 mg four times a day as needed for sore throat and tongue  See Tammy NP w/in 2 weeks with Patty and a 4 slot per day container all your medications, even over the counter meds, separated in two separate bags, the ones you take no matter what vs the ones you stop once you feel better and take only as needed when you feel you need them.   Tammy  will generate for you a new user friendly medication calendar that will put [place] all on the same page re: your medication use.   Without this process, it simply isn't possible to assure that we are providing  your outpatient care  with  the attention to detail we feel you deserve.   If we cannot assure that you're getting that kind of care,  then we cannot manage your problem effectively from this clinic. Once  you have seen Tammy and we are sure that we're all on the same page with your medication use she will arrange follow up with me.  Add:  Needs full pfts on return as well as sinus CT      02/22/2017  f/u ov/Gabriel Ramos re: triad asthma/ symb empty, back to using saba multiple forms daily  Chief Complaint  Patient presents with  . Follow-up    Pt states that he has been having some troubles with acid reflux, chest tightness, congestion and SOB when laying down at night and is nauseous every morning. Denies any cough or CP.  worse breathing at hs x one month assoc with bad hb/ nausea since ran out of prednisone  Still maint on ppi /h2 hs   No obvious day to day or daytime variability or assoc excess/ purulent sputum or mucus plugs or hemoptysis or cp or chest tightness, subjective wheeze or overt sinus  symptoms. No unusual exp hx or h/o childhood pna/ asthma or knowledge of premature birth.  Sleeping ok flat without nocturnal  or early am exacerbation  of respiratory  c/o's or need for noct saba. Also denies any obvious fluctuation of symptoms with weather or environmental changes or other aggravating or alleviating factors except as outlined above   Current Allergies, Complete Past Medical History, Past Surgical History, Family History, and Social History were reviewed in [employer] record.  ROS  The following are not active complaints unless bolded sore throat, dysphagia, dental problems, itching, sneezing,  nasal congestion or disharge of excess mucus or purulent secretions, ear ache,   fever, chills, sweats, unintended wt loss or wt gain, classically pleuritic or exertional cp,  orthopnea pnd or leg swelling, presyncope, palpitations, abdominal pain, anorexia, nausea, vomiting, diarrhea  or change in bowel habits or bladder habits, change in stools or change in urine, dysuria, hematuria,  rash, arthralgias, visual complaints, headache, numbness, weakness or ataxia or problems  with walking or coordination,  change in mood/affect or memory.        Current Meds  Medication Sig  . albuterol (PROAIR HFA) 108 (90 Base) MCG/ACT inhaler Inhale 2 puffs into the lungs every 6 (six) hours as needed for wheezing or shortness of breath.  Marland Kitchen albuterol (PROVENTIL) (2.5 MG/3ML) 0.083% nebulizer solution Take 3 mLs (2.5 mg total) by nebulization every 6 (six) hours as needed for wheezing or shortness of breath.  . allopurinol (ZYLOPRIM) 100 MG tablet   . bisoprolol (ZEBETA) 5 MG tablet Take  1 tablet (5 mg total) by mouth daily.  . budesonide-formoterol (SYMBICORT) 160-4.5 MCG/ACT inhaler Inhale 2 puffs into the lungs 2 (two) times daily.  . famotidine (PEPCID) 20 MG tablet One at bedtime  . FLUoxetine (PROZAC) 20 MG capsule Take 20 mg by mouth daily.  . montelukast (SINGULAIR) 10 MG tablet   . Nebulizers (NEBULIZER COMPRESSOR) MISC Use as directed Dx:  . ondansetron (ZOFRAN) 4 MG tablet Take 1 tablet (4 mg total) by mouth every 6 (six) hours.  . pantoprazole (PROTONIX) 40 MG tablet Take 1 tablet (40 mg total) by mouth daily. Take 30-60 min before first meal of the day  . potassium chloride (K-DUR) 10 MEQ tablet Take 1 tablet (10 mEq total) by mouth daily.  . verapamil (CALAN-SR) 180 MG CR tablet Take 360 mg by mouth daily.  . [DISCONTINUED] budesonide-formoterol (SYMBICORT) 160-4.5 MCG/ACT inhaler Inhale 2 puffs into the lungs 2 (two) times daily.  . [DISCONTINUED] budesonide-formoterol (SYMBICORT) 160-4.5 MCG/ACT inhaler Inhale 2 puffs into the lungs 2 (two) times daily.                  Objective:   Physical Exam   amb  wm nad/ unusual affect/ disheveled appearance  02/22/2017       131   10/17/2016      134   09/15/16 133 lb 12.8 oz (60.7 kg)  03/23/12 157 lb 6.4 oz (71.4 kg)  12/18/06 160 lb 9.6 oz (72.8 kg)    Vital signs reviewed - Note on arrival 02 sats  96% on RA      HEENT: nl dentition, moderate bilateral non-specific turbinate edema  but no viz  polyps -  And    Oropharynx with moderate thrush. Nl external ear canals without cough reflex   NECK :  without JVD/Nodes/TM/ nl carotid upstrokes bilaterally   LUNGS: no acc muscle use,  slt barrel contour  insp and exp sonorous rhonchi bilaterally   CV:  RRR  no s3 or murmur or increase in P2, and no edema   ABD:  soft and nontender with nl inspiratory excursion in the supine position. No bruits or organomegaly appreciated, bowel sounds nl  MS:  Nl gait/ ext warm without deformities, calf tenderness, cyanosis or clubbing No obvious joint restrictions   SKIN: warm and dry without lesions    NEURO:  alert, approp, nl sensorium with  no motor or cerebellar deficits apparent.            Assessment & Plan:

## 2017-02-23 NOTE — Assessment & Plan Note (Addendum)
Allergy profile 09/15/2016 >  Eos 0.5/  IgE  271 Grass > dog  - 09/15/2016    try symbicort 160 2bid >  Continued to use it beyond the red line   - 10/17/2016  After extensive coaching HFA effectiveness =    75%  (ti too short)  - sinus CT ordered  DDX of  difficult airways management almost all start with A and  include Adherence, Ace Inhibitors, Acid Reflux, Active Sinus Disease, Alpha 1 Antitripsin deficiency, Anxiety masquerading as Airways dz,  ABPA,  Allergy(esp in young), Aspiration (esp in elderly), Adverse effects of meds,  Active smokers, A bunch of PE's (a small clot burden can't cause this syndrome unless there is already severe underlying pulm or vascular dz with poor reserve) plus two Bs  = Bronchiectasis and Beta blocker use..and one C= CHF    Adherence is always the initial "prime suspect" and is a multilayered concern that requires a "trust but verify" approach in every patient - starting with knowing how to use medications, especially inhalers, correctly, keeping up with refills and understanding the fundamental difference between maintenance and prns vs those medications only taken for a very short course and then stopped and not refilled.  - see hfa teaching - return for med reconciliation:  To keep things simple, I have asked the patient to first separate medicines that are perceived as maintenance, that is to be taken daily "no matter what", from those medicines that are taken on only on an as-needed basis and I have given the patient examples of both, and then return to see our NP to generate a  detailed  medication calendar which should be followed until the next physician sees the patient and updates it.     ? Allergies > still has dog in house, prev pred dep and nauseated since  Stopped so rec trial of pred 20 until better then 10 mg until returns/ consider for nucala or other IL-5 inhibitor if insurance will cover  ? Active sinus dz > h/o triad asthma > check sinus CT   ?  Acid (or non-acid) GERD > always difficult to exclude as up to 75% of pts in some series report no assoc GI/ Heartburn symptoms> rec continue max (24h)  acid suppression and diet restrictions/ reviewed     ? Anxiety/depression  > usually at the bottom of this list of usual suspects but should be much higher on this pt's based on H and P and note already on psychotropics .   ? BB effects > unlikely on low dose bisoprolol, the most selective BB on market  I had an extended discussion with the patient reviewing all relevant studies completed to date and  lasting 15 to 20 minutes of a 25 minute visit    Each maintenance medication was reviewed in detail including most importantly the difference between maintenance and prns and under what circumstances the prns are to be triggered using an action plan format that is not reflected in the computer generated alphabetically organized AVS.    Please see AVS for specific instructions unique to this visit that I personally wrote and verbalized to the the pt in detail and then reviewed with pt  by my nurse highlighting any  changes in therapy recommended at today's visit to their plan of care.

## 2017-03-02 ENCOUNTER — Ambulatory Visit (INDEPENDENT_AMBULATORY_CARE_PROVIDER_SITE_OTHER)
Admission: RE | Admit: 2017-03-02 | Discharge: 2017-03-02 | Disposition: A | Discharge status: Home / Self Care | Payer: BLUE CROSS/BLUE SHIELD | Source: Ambulatory Visit | Attending: Internal Medicine | Admitting: Internal Medicine

## 2017-03-02 DIAGNOSIS — J4550 Severe persistent asthma, uncomplicated: Secondary | ICD-10-CM | POA: Diagnosis not present

## 2017-03-03 NOTE — Progress Notes (Signed)
Spoke with pt and notified of results per Dr. Wert. Pt verbalized understanding and denied any questions. 

## 2017-03-13 ENCOUNTER — Encounter: Payer: BLUE CROSS/BLUE SHIELD | Admitting: Adult Health

## 2017-05-08 ENCOUNTER — Other Ambulatory Visit: Payer: Self-pay | Admitting: Internal Medicine

## 2017-05-08 DIAGNOSIS — J4550 Severe persistent asthma, uncomplicated: Secondary | ICD-10-CM

## 2017-05-11 ENCOUNTER — Telehealth: Payer: Self-pay | Admitting: Internal Medicine

## 2017-05-11 MED ORDER — ALBUTEROL SULFATE HFA 108 (90 BASE) MCG/ACT IN AERS: 2.0000 | INHALATION_SPRAY | Freq: Four times a day (QID) | RESPIRATORY_TRACT | 2 refills | Status: DC | PRN

## 2017-05-11 NOTE — Telephone Encounter (Signed)
Spoke with pt and advised rx sent to pharmacy. Nothing further is needed.   

## 2017-07-07 ENCOUNTER — Other Ambulatory Visit: Payer: Self-pay | Admitting: Internal Medicine

## 2017-07-07 DIAGNOSIS — J4550 Severe persistent asthma, uncomplicated: Secondary | ICD-10-CM

## 2017-10-26 ENCOUNTER — Other Ambulatory Visit: Payer: Self-pay | Admitting: Internal Medicine

## 2018-03-26 DIAGNOSIS — J45901 Unspecified asthma with (acute) exacerbation: Secondary | ICD-10-CM | POA: Diagnosis not present

## 2018-07-11 DIAGNOSIS — I10 Essential (primary) hypertension: Secondary | ICD-10-CM | POA: Diagnosis not present

## 2018-07-11 DIAGNOSIS — Z23 Encounter for immunization: Secondary | ICD-10-CM | POA: Diagnosis not present

## 2018-07-11 DIAGNOSIS — D649 Anemia, unspecified: Secondary | ICD-10-CM | POA: Diagnosis not present

## 2018-07-11 DIAGNOSIS — Z125 Encounter for screening for malignant neoplasm of prostate: Secondary | ICD-10-CM | POA: Diagnosis not present

## 2018-07-11 DIAGNOSIS — Z0000 Encounter for general adult medical examination without abnormal findings: Secondary | ICD-10-CM | POA: Diagnosis not present

## 2018-07-11 DIAGNOSIS — F338 Other recurrent depressive disorders: Secondary | ICD-10-CM | POA: Diagnosis not present

## 2018-07-11 DIAGNOSIS — J4550 Severe persistent asthma, uncomplicated: Secondary | ICD-10-CM | POA: Diagnosis not present

## 2018-07-11 DIAGNOSIS — F1110 Opioid abuse, uncomplicated: Secondary | ICD-10-CM | POA: Diagnosis not present

## 2018-07-11 DIAGNOSIS — K7010 Alcoholic hepatitis without ascites: Secondary | ICD-10-CM | POA: Diagnosis not present

## 2018-08-12 ENCOUNTER — Encounter: Payer: Self-pay | Admitting: Cardiology

## 2018-08-12 DIAGNOSIS — R002 Palpitations: Secondary | ICD-10-CM | POA: Insufficient documentation

## 2018-08-12 DIAGNOSIS — I471 Supraventricular tachycardia, unspecified: Secondary | ICD-10-CM | POA: Insufficient documentation

## 2018-08-12 HISTORY — DX: Supraventricular tachycardia: I47.1

## 2018-08-12 HISTORY — DX: Supraventricular tachycardia, unspecified: I47.10

## 2018-08-12 NOTE — Progress Notes (Signed)
Cardiology Office Note    Date:  08/13/2018   ID:  Gabriel Ramos, DOB 02-03-1951, MRN [id number]  PCP:  Hulan Fess, MD  Cardiologist:  Fransico Him, MD   Chief Complaint  Patient presents with  . New Patient (Initial Visit)    Palpitations    History of Present Illness:  Gabriel Ramos is a 68 y.o. male who is being seen today for the evaluation of palpitations at the request of Little, Lennette Bihari, MD.  Is a pleasant 68 year old male with a history of alcoholic abuse with alcoholic hepatitis, chronic asthma, depression, GERD, hypertension history of SVT in 2013.  Stress Myoview was normal as well in 2013. He had some problems with syncope in 2017 and was evaluated by Dr. Wynonia Lawman with event monitor showing normal sinus rhythm with no arrhythmias.  He did have syncope while wearing the heart monitor and had a heart rate of 90 bpm with no arrhythmias.  Echo showed normal LV function with mild MR.    he saw Dr. Rex Kras on 07/11/2018 for physical exam.  He has a history of persistent asthma with CT in the past showing mucous plugging.  During his recent physical exam he was noted to have an abnormal EKG and he is now referred by Dr. Rex Kras for evaluation of abnormal EKG and palpitations.   He denies any chest pain or pressure, PND, orthopnea, LE edema, dizziness,  or recent syncope.  He has significant problems with asthma and asthmatic bronchitis and does have dyspnea on exertion when going up the stairs.  He awakens at night short of breath and has to cough a lot.  He says he produces a lot of mucus at night.  Used to see Dr. Melvyn Novas but did not get along with him and has not seen him since.  He is compliant with his meds and is tolerating meds with no SE. his last LDL was 55 with an HDL 54 and total cholesterol 123.  TSH was normal at 1.36 on recent labs.  EKG done in Dr. Eddie Dibbles office showed normal sinus rhythm with inferior infarct versus left anterior fascicular block and right atrial  enlargement.  Past Medical History:  Diagnosis Date  . Abnormal CXR 09/16/2016   Report 09/07/16 c/w granuloma  . Alcohol dependence with unspecified alcohol-induced disorder ([place])   . Alcoholic hepatitis without ascites   . ALLERGIC RHINITIS 09/14/2006   Qualifier: Diagnosis of  By: [name]    . Anxiety   . Asthma, chronic, severe persistent, uncomplicated 2/99/3716   Allergy profile 09/15/2016 >  Eos 0.5/  IgE  271 Grass > dog  - 09/15/2016    try symbicort 160 2bid  - 10/17/2016  After extensive coaching HFA effectiveness =    75%  (ti too short)  - sinus CT 03/02/2017 >  Pos polyp    . BPH (benign prostatic hyperplasia)   . Concentric left ventricular hypertrophy   . DEPRESSION 09/14/2006   Qualifier: Diagnosis of  By: [name]    . GERD (gastroesophageal reflux disease)   . Gout 03/28/2012  . History of paroxysmal supraventricular tachycardia   . Hypertension   . Hypoalbuminemia   . Hypokalemia   . Nausea   . Opioid abuse ([place])   . Simple chronic bronchitis ([place])   . SVT (supraventricular tachycardia) ([place])    2013,echo Varanasi NI LV EF ,mild MR: nl stress cl 3/13  . SVT (supraventricular tachycardia) ([place]) 08/12/2018   Cardiac work-up in 2013 normal  .  Vertigo     Past Surgical History:  Procedure Laterality Date  . polyps remvoed from nose      Current Medications: Current Meds  Medication Sig  . albuterol (PROAIR HFA) 108 (90 Base) MCG/ACT inhaler Inhale 2 puffs into the lungs every 6 (six) hours as needed for wheezing or shortness of breath.  Marland Kitchen albuterol (PROVENTIL) (2.5 MG/3ML) 0.083% nebulizer solution Take 3 mLs (2.5 mg total) by nebulization every 6 (six) hours as needed for wheezing or shortness of breath.  . allopurinol (ZYLOPRIM) 100 MG tablet   . bisoprolol (ZEBETA) 5 MG tablet Take 1 tablet (5 mg total) by mouth daily.  . budesonide-formoterol (SYMBICORT) 160-4.5 MCG/ACT inhaler Inhale 2 puffs into the lungs 2 (two) times daily.  . Buprenorphine  HCl-Naloxone HCl 12-3 MG FILM   . clotrimazole (MYCELEX) 10 MG troche Use up to 4 x daily for sore throat  . famotidine (PEPCID) 20 MG tablet TAKE 1 TABLET BY MOUTH EVERYDAY AT BEDTIME  . FLUoxetine (PROZAC) 20 MG capsule Take 20 mg by mouth daily.  Marland Kitchen LOSARTAN POTASSIUM PO Take 25 mg by mouth daily.  . montelukast (SINGULAIR) 10 MG tablet   . Nebulizers (NEBULIZER COMPRESSOR) MISC Use as directed Dx:  . ondansetron (ZOFRAN) 4 MG tablet Take 1 tablet (4 mg total) by mouth every 6 (six) hours.  . pantoprazole (PROTONIX) 40 MG tablet TAKE 1 TABLET (40 MG TOTAL) BY MOUTH DAILY. TAKE 30-60 MIN BEFORE FIRST MEAL OF THE DAY  . potassium chloride (K-DUR) 10 MEQ tablet Take 1 tablet (10 mEq total) by mouth daily.  . predniSONE (DELTASONE) 10 MG tablet Take   2 each am until better then 1 daily until return  . tamsulosin (FLOMAX) 0.4 MG CAPS capsule Take 0.4 mg by mouth daily.  . verapamil (CALAN-SR) 180 MG CR tablet Take 360 mg by mouth daily.    Allergies:   Aspirin and Penicillins   Social History   Socioeconomic History  . Marital status: Significant Other    Spouse name: Not on file  . Number of children: Not on file  . Years of education: Not on file  . Highest education level: Not on file  Occupational History  . Not on file  Social Needs  . Financial resource strain: Not on file  . Food insecurity:    Worry: Not on file    Inability: Not on file  . Transportation needs:    Medical: Not on file    Non-medical: Not on file  Tobacco Use  . Smoking status: Never Smoker  . Smokeless tobacco: Never Used  Substance and Sexual Activity  . Alcohol use: Yes    Alcohol/week: 3.0 standard drinks    Types: 3 Cans of beer per week    Comment: smirnoffs, BRANDY BOTTLE EVERY OTHER DAY  . Drug use: No  . Sexual activity: Yes    Birth control/protection: None  Lifestyle  . Physical activity:    Days per week: Not on file    Minutes per session: Not on file  . Stress: Not on file   Relationships  . Social connections:    Talks on phone: Not on file    Gets together: Not on file    Attends religious service: Not on file    Active member of club or organization: Not on file    Attends meetings of clubs or organizations: Not on file    Relationship status: Not on file  Other Topics Concern  . Not  on file  Social History Narrative  . Not on file     Family History:  The patient's family history includes Asthma in his brother; Cancer in his brother; Heart disease in his brother and father; Heart murmur in his mother.   ROS:   Please see the history of present illness.    ROS All other systems reviewed and are negative.  No flowsheet data found.     PHYSICAL EXAM:   VS:  BP 117/77   Pulse 90   Ht 5\' 10"  (1.778 m)   Wt 133 lb 6.4 oz (60.5 kg)   SpO2 93%   BMI 19.14 kg/m    GEN: Well nourished, well developed, in no acute distress  HEENT: normal  Neck: no JVD, carotid bruits, or masses Cardiac: RRR; no murmurs, rubs, or gallops,no edema.  Intact distal pulses bilaterally.  Respiratory: Scattered wheezes and rhonchi throughout the left lung field GI: soft, nontender, nondistended, + BS MS: no deformity or atrophy  Skin: warm and dry, no rash Neuro:  Alert and Oriented x 3, Strength and sensation are intact Psych: euthymic mood, full affect  Wt Readings from Last 3 Encounters:  08/13/18 133 lb 6.4 oz (60.5 kg)  02/22/17 131 lb 12.8 oz (59.8 kg)  10/17/16 134 lb (60.8 kg)      Studies/Labs Reviewed:   EKG:  EKG is not ordered today.    Recent Labs: No results found for requested labs within last 8760 hours.   Lipid Panel    Component Value Date/Time   CHOL 156 10/24/2006 1336   TRIG 83 10/24/2006 1336   HDL 58.1 10/24/2006 1336   CHOLHDL 2.7 CALC 10/24/2006 1336   VLDL 17 10/24/2006 1336   LDLCALC 81 10/24/2006 1336    Additional studies/ records that were reviewed today include:  Office notes from PCP    ASSESSMENT:    1.  Palpitations   2. SVT (supraventricular tachycardia) ([place])   3. Essential hypertension   4. Wheezing   5. Asthmatic bronchitis with acute exacerbation, unspecified asthma severity, unspecified whether persistent      PLAN:  In order of problems listed above:  1.  Palpitations - he does have a history of SVT in the past.  He has not had any palpitations since then until recently.  I am going to get an event monitor to assess for arrhythmias including atrial and ventricular in origin.  Likely his Proventil inhaler may be attributing to his palpitations.  Recommend consideration of changing albuterol to Xopenex for treatment of his underlying asthma because of his history of SVT.  Apparently there was some question about insurance paying for Xopenex but would commend PCP contact his insurance company back to let them know that patient has a history of SVT and palpitations be induced by albuterol.  I have recommended proceeding with event monitor to evaluate for arrhythmias.  2.  Hypertension - BP is well controlled on exam today.  He will continue on losartan 25 mg daily and verapamil SR 360 mg daily as well as bisoprolol 5 mg daily  3.  History of SVT - cardiac work-up in 2013 for this was normal including a nuclear stress test and echo.  4.  Asthmatic bronchitis - he has significant wheezing on exam today in the left lung field.  Been using albuterol per his PCP.  He says he has a lot of mucus production at night and wakes up coughing.  He was evaluated by Dr. Melvyn Novas in  the past but says he did not get along with him.  I am going to refer him back to pulmonary and see if they can see him this week.  Recommend changing albuterol to Xopenex.  5.  Abnormal EKG - EKG shows evidence of right atrial enlargement as well as left anterior fascicular block versus inferior infarct.  I have recommended getting a 2D echocardiogram to assess for wall motion abnormalities in the inferior wall as well as assess for  left atrial and right atrial enlargement.  He gives no history of chest pain and his shortness of breath going up stairs is likely related to his underlying asthma especially given his lung findings on exam today.  He had a normal stress test in 2017.    Medication Adjustments/Labs and Tests Ordered: Current medicines are reviewed at length with the patient today.  Concerns regarding medicines are outlined above.  Medication changes, Labs and Tests ordered today are listed in the Patient Instructions below.  Patient Instructions  Medication Instructions:  Your physician recommends that you continue on your current medications as directed. Please refer to the Current Medication list given to you today. If you need a refill on your cardiac medications before your next appointment, please call your pharmacy.   Lab work: None If you have labs (blood work) drawn today and your tests are completely normal, you will receive your results only by: Marland Kitchen MyChart Message (if you have MyChart) OR . A paper copy in the mail If you have any lab test that is abnormal or we need to change your treatment, we will call you to review the results.  Testing/Procedures: Your physician has recommended that you wear an event monitor. Event monitors are medical devices that record the heart's electrical activity. Doctors most often [place] these monitors to diagnose arrhythmias. Arrhythmias are problems with the speed or rhythm of the heartbeat. The monitor is a small, portable device. You can wear one while you do your normal daily activities. This is usually used to diagnose what is causing palpitations/syncope (passing out).  Follow-Up: As needed following results.   You have been referred to Pulmonology ASAP.        Signed, Fransico Him, MD  08/13/2018 12:01 PM    [facility] [address], [place], [place]  [address] Phone: [phone]; Fax: [phone]

## 2018-08-13 ENCOUNTER — Encounter: Payer: Self-pay | Admitting: Cardiology

## 2018-08-13 ENCOUNTER — Ambulatory Visit: Payer: Medicare HMO | Admitting: Cardiology

## 2018-08-13 ENCOUNTER — Telehealth: Payer: Self-pay | Admitting: Cardiology

## 2018-08-13 VITALS — BP 117/77 | HR 90 | Ht 70.0 in | Wt 133.4 lb

## 2018-08-13 DIAGNOSIS — I471 Supraventricular tachycardia: Secondary | ICD-10-CM | POA: Diagnosis not present

## 2018-08-13 DIAGNOSIS — I10 Essential (primary) hypertension: Secondary | ICD-10-CM | POA: Diagnosis not present

## 2018-08-13 DIAGNOSIS — R062 Wheezing: Secondary | ICD-10-CM

## 2018-08-13 DIAGNOSIS — J45901 Unspecified asthma with (acute) exacerbation: Secondary | ICD-10-CM

## 2018-08-13 DIAGNOSIS — R002 Palpitations: Secondary | ICD-10-CM | POA: Diagnosis not present

## 2018-08-13 DIAGNOSIS — R9431 Abnormal electrocardiogram [ECG] [EKG]: Secondary | ICD-10-CM | POA: Diagnosis not present

## 2018-08-13 NOTE — Telephone Encounter (Signed)
Spoke with the patient about his pulmonology appointment. He had no further questions.

## 2018-08-13 NOTE — Telephone Encounter (Signed)
New Message   Patient states he needs a different Pulmonologist than Dr. Melvyn Novas.  Please call him to get him scheduled elsewhere.

## 2018-08-13 NOTE — Patient Instructions (Addendum)
Medication Instructions:  Your physician recommends that you continue on your current medications as directed. Please refer to the Current Medication list given to you today. If you need a refill on your cardiac medications before your next appointment, please call your pharmacy.   Lab work: None If you have labs (blood work) drawn today and your tests are completely normal, you will receive your results only by: Marland Kitchen MyChart Message (if you have MyChart) OR . A paper copy in the mail If you have any lab test that is abnormal or we need to change your treatment, we will call you to review the results.  Testing/Procedures: Your physician has recommended that you wear an event monitor. Event monitors are medical devices that record the heart's electrical activity. Doctors most often [place] these monitors to diagnose arrhythmias. Arrhythmias are problems with the speed or rhythm of the heartbeat. The monitor is a small, portable device. You can wear one while you do your normal daily activities. This is usually used to diagnose what is causing palpitations/syncope (passing out).  Your physician has requested that you have an echocardiogram. Echocardiography is a painless test that uses sound waves to create images of your heart. It provides your doctor with information about the size and shape of your heart and how well your heart's chambers and valves are working. This procedure takes approximately one hour. There are no restrictions for this procedure.  Follow-Up: As needed following results.   You have been referred to Pulmonology ASAP.

## 2018-08-15 ENCOUNTER — Institutional Professional Consult (permissible substitution): Payer: Medicare HMO | Admitting: Internal Medicine

## 2018-08-23 ENCOUNTER — Institutional Professional Consult (permissible substitution): Payer: Medicare HMO | Admitting: Emergency Medicine

## 2018-08-29 ENCOUNTER — Telehealth: Payer: Self-pay

## 2018-08-29 NOTE — Telephone Encounter (Signed)
Spoke with pt.Marland KitchenMarland KitchenAppt for today cancelled due to COVID-19.Marland KitchenEvent monitor will be registered to be mailed to pt today.

## 2018-08-31 ENCOUNTER — Ambulatory Visit (HOSPITAL_COMMUNITY): Payer: Medicare HMO | Attending: Cardiovascular Disease

## 2018-09-04 ENCOUNTER — Encounter (INDEPENDENT_AMBULATORY_CARE_PROVIDER_SITE_OTHER): Payer: Medicare HMO

## 2018-09-04 DIAGNOSIS — R002 Palpitations: Secondary | ICD-10-CM | POA: Diagnosis not present

## 2018-10-01 ENCOUNTER — Institutional Professional Consult (permissible substitution): Payer: Medicare HMO | Admitting: Emergency Medicine

## 2018-10-03 ENCOUNTER — Other Ambulatory Visit: Payer: Self-pay

## 2018-10-08 ENCOUNTER — Telehealth: Payer: Self-pay | Admitting: *Deleted

## 2018-10-08 NOTE — Telephone Encounter (Signed)
Left message to go over monitor results 

## 2018-10-08 NOTE — Telephone Encounter (Signed)
-----   Message from Nuala Alpha, LPN sent at 12/08/516  1:26 PM EDT -----  ----- Message ----- From: Sueanne Margarita, MD Sent: 10/08/2018   9:59 AM EDT To: Mary Sella Kordsmeier, RN, [facility] [employer] Triage  Please let patient know that heart monitor showed normal sinus rhythm with occasional extra heartbeats from the top and bottom of his heart which are benign.

## 2018-10-11 NOTE — Telephone Encounter (Signed)
Late entry: Notes recorded by Michae Kava, CMA on 10/10/2018 at 3:43 PM EDT The patient has been notified of the result and verbalized understanding. All questions (if any) were answered. Pt asked if we could mail him a copy of monitor results. I explained too many pages though we could probably mail out final summary report. I explained that I am working remotely and will have someone in the office mail out report. I verified pt's address. Pt thanked me for the call and the good news.  Julaine Hua, [place] 10/10/2018 3:41 PM

## 2018-11-19 ENCOUNTER — Telehealth (HOSPITAL_COMMUNITY): Payer: Self-pay | Admitting: Radiology

## 2018-11-19 NOTE — Telephone Encounter (Signed)
Left message to call office-Patient needs to schedule an echocardiogram.  

## 2018-11-26 DIAGNOSIS — J449 Chronic obstructive pulmonary disease, unspecified: Secondary | ICD-10-CM | POA: Diagnosis not present

## 2018-11-26 DIAGNOSIS — N400 Enlarged prostate without lower urinary tract symptoms: Secondary | ICD-10-CM | POA: Diagnosis not present

## 2018-11-26 DIAGNOSIS — J410 Simple chronic bronchitis: Secondary | ICD-10-CM | POA: Diagnosis not present

## 2018-11-26 DIAGNOSIS — I10 Essential (primary) hypertension: Secondary | ICD-10-CM | POA: Diagnosis not present

## 2018-11-26 DIAGNOSIS — F1020 Alcohol dependence, uncomplicated: Secondary | ICD-10-CM | POA: Diagnosis not present

## 2018-11-26 DIAGNOSIS — F329 Major depressive disorder, single episode, unspecified: Secondary | ICD-10-CM | POA: Diagnosis not present

## 2018-11-26 DIAGNOSIS — J45909 Unspecified asthma, uncomplicated: Secondary | ICD-10-CM | POA: Diagnosis not present

## 2018-11-26 DIAGNOSIS — J4550 Severe persistent asthma, uncomplicated: Secondary | ICD-10-CM | POA: Diagnosis not present

## 2018-12-13 ENCOUNTER — Encounter (HOSPITAL_COMMUNITY): Payer: Self-pay | Admitting: Cardiology

## 2018-12-27 ENCOUNTER — Institutional Professional Consult (permissible substitution): Payer: Medicare HMO | Admitting: Emergency Medicine

## 2019-01-02 ENCOUNTER — Telehealth (HOSPITAL_COMMUNITY): Payer: Self-pay | Admitting: Radiology

## 2019-01-02 NOTE — Telephone Encounter (Signed)
Called to schedule echocardiogram-Call can not be completed as dialed.

## 2019-01-07 ENCOUNTER — Institutional Professional Consult (permissible substitution): Payer: Medicare HMO | Admitting: Emergency Medicine

## 2019-02-12 ENCOUNTER — Telehealth (HOSPITAL_COMMUNITY): Payer: Self-pay

## 2019-02-12 NOTE — Telephone Encounter (Signed)
New message    Just an FYI. We have made several attempts to contact this patient including sending a letter to schedule or reschedule their echocardiogram. We will be removing the patient from the echo WQ.   8.24.20 @ 2:38pm both # are the same - phone does not work Gabriel Ramos 7.9.20 mail reminder letter Gabriel Ramos  COVID-19 01/02/2019 phone number does not work evd 3.27.20 no show

## 2019-03-06 DIAGNOSIS — J45909 Unspecified asthma, uncomplicated: Secondary | ICD-10-CM | POA: Diagnosis not present

## 2019-03-06 DIAGNOSIS — N400 Enlarged prostate without lower urinary tract symptoms: Secondary | ICD-10-CM | POA: Diagnosis not present

## 2019-03-06 DIAGNOSIS — I10 Essential (primary) hypertension: Secondary | ICD-10-CM | POA: Diagnosis not present

## 2019-03-06 DIAGNOSIS — J4550 Severe persistent asthma, uncomplicated: Secondary | ICD-10-CM | POA: Diagnosis not present

## 2019-03-06 DIAGNOSIS — F329 Major depressive disorder, single episode, unspecified: Secondary | ICD-10-CM | POA: Diagnosis not present

## 2019-03-06 DIAGNOSIS — J410 Simple chronic bronchitis: Secondary | ICD-10-CM | POA: Diagnosis not present

## 2019-03-06 DIAGNOSIS — J45901 Unspecified asthma with (acute) exacerbation: Secondary | ICD-10-CM | POA: Diagnosis not present

## 2019-07-05 DIAGNOSIS — J4550 Severe persistent asthma, uncomplicated: Secondary | ICD-10-CM | POA: Diagnosis not present

## 2019-07-05 DIAGNOSIS — N400 Enlarged prostate without lower urinary tract symptoms: Secondary | ICD-10-CM | POA: Diagnosis not present

## 2019-07-05 DIAGNOSIS — J45901 Unspecified asthma with (acute) exacerbation: Secondary | ICD-10-CM | POA: Diagnosis not present

## 2019-07-05 DIAGNOSIS — I10 Essential (primary) hypertension: Secondary | ICD-10-CM | POA: Diagnosis not present

## 2019-07-05 DIAGNOSIS — J45909 Unspecified asthma, uncomplicated: Secondary | ICD-10-CM | POA: Diagnosis not present

## 2019-07-05 DIAGNOSIS — J410 Simple chronic bronchitis: Secondary | ICD-10-CM | POA: Diagnosis not present

## 2019-07-05 DIAGNOSIS — F329 Major depressive disorder, single episode, unspecified: Secondary | ICD-10-CM | POA: Diagnosis not present

## 2019-07-31 DIAGNOSIS — F1021 Alcohol dependence, in remission: Secondary | ICD-10-CM | POA: Diagnosis not present

## 2019-07-31 DIAGNOSIS — Z1159 Encounter for screening for other viral diseases: Secondary | ICD-10-CM | POA: Diagnosis not present

## 2019-07-31 DIAGNOSIS — Z8739 Personal history of other diseases of the musculoskeletal system and connective tissue: Secondary | ICD-10-CM | POA: Diagnosis not present

## 2019-07-31 DIAGNOSIS — R894 Abnormal immunological findings in specimens from other organs, systems and tissues: Secondary | ICD-10-CM | POA: Diagnosis not present

## 2019-07-31 DIAGNOSIS — J45909 Unspecified asthma, uncomplicated: Secondary | ICD-10-CM | POA: Diagnosis not present

## 2019-07-31 DIAGNOSIS — F1111 Opioid abuse, in remission: Secondary | ICD-10-CM | POA: Diagnosis not present

## 2019-07-31 DIAGNOSIS — I10 Essential (primary) hypertension: Secondary | ICD-10-CM | POA: Diagnosis not present

## 2019-07-31 DIAGNOSIS — F338 Other recurrent depressive disorders: Secondary | ICD-10-CM | POA: Diagnosis not present

## 2019-07-31 DIAGNOSIS — D649 Anemia, unspecified: Secondary | ICD-10-CM | POA: Diagnosis not present

## 2019-07-31 DIAGNOSIS — R1114 Bilious vomiting: Secondary | ICD-10-CM | POA: Diagnosis not present

## 2019-07-31 DIAGNOSIS — Z125 Encounter for screening for malignant neoplasm of prostate: Secondary | ICD-10-CM | POA: Diagnosis not present

## 2019-07-31 DIAGNOSIS — Z0000 Encounter for general adult medical examination without abnormal findings: Secondary | ICD-10-CM | POA: Diagnosis not present

## 2019-08-01 DIAGNOSIS — E8809 Other disorders of plasma-protein metabolism, not elsewhere classified: Secondary | ICD-10-CM | POA: Diagnosis not present

## 2019-08-01 DIAGNOSIS — R894 Abnormal immunological findings in specimens from other organs, systems and tissues: Secondary | ICD-10-CM | POA: Diagnosis not present

## 2019-08-01 DIAGNOSIS — Z1159 Encounter for screening for other viral diseases: Secondary | ICD-10-CM | POA: Diagnosis not present

## 2019-08-01 DIAGNOSIS — N400 Enlarged prostate without lower urinary tract symptoms: Secondary | ICD-10-CM | POA: Diagnosis not present

## 2019-08-01 DIAGNOSIS — Z8679 Personal history of other diseases of the circulatory system: Secondary | ICD-10-CM | POA: Diagnosis not present

## 2019-08-01 DIAGNOSIS — R1114 Bilious vomiting: Secondary | ICD-10-CM | POA: Diagnosis not present

## 2019-08-01 DIAGNOSIS — J45909 Unspecified asthma, uncomplicated: Secondary | ICD-10-CM | POA: Diagnosis not present

## 2019-08-01 DIAGNOSIS — Z0000 Encounter for general adult medical examination without abnormal findings: Secondary | ICD-10-CM | POA: Diagnosis not present

## 2019-08-01 DIAGNOSIS — F338 Other recurrent depressive disorders: Secondary | ICD-10-CM | POA: Diagnosis not present

## 2019-08-05 ENCOUNTER — Telehealth: Payer: Self-pay | Admitting: Pharmacy Technician

## 2019-08-05 NOTE — Telephone Encounter (Signed)
[facility] Patient Advocate Encounter  Insurance verification completed.    The patient is insured through [employer].  We will continue to follow to see if copay assistance is needed.  Oliverio Cho E. Deane Melick, CPhT Specialty Pharmacy Patient Advocate [facility] Phone: [phone] Fax:  [phone]   

## 2019-08-06 ENCOUNTER — Encounter: Payer: Self-pay | Admitting: Family

## 2019-08-06 ENCOUNTER — Telehealth: Payer: Self-pay | Admitting: Pharmacy Technician

## 2019-08-06 ENCOUNTER — Other Ambulatory Visit: Payer: Self-pay

## 2019-08-06 ENCOUNTER — Ambulatory Visit (INDEPENDENT_AMBULATORY_CARE_PROVIDER_SITE_OTHER): Payer: Medicare HMO | Admitting: Family

## 2019-08-06 VITALS — BP 129/89 | HR 91 | Temp 97.6°F | Ht 68.0 in | Wt 152.0 lb

## 2019-08-06 DIAGNOSIS — B182 Chronic viral hepatitis C: Secondary | ICD-10-CM

## 2019-08-06 NOTE — Telephone Encounter (Signed)
[facility] Patient Advocate Encounter  Gathered information for copay assistance should he need it for a Hepatitis C medicaiton.  Gabriel Ramos. Gabriel Ramos Patient [facility] Phone: [phone] Fax:  [phone]

## 2019-08-06 NOTE — Progress Notes (Signed)
Subjective:    Patient ID: Gabriel Ramos, male    DOB: 24-Jun-1950, 69 y.o.   MRN: [id number]  Chief Complaint  Patient presents with  . Hepatitis C    HPI:  Gabriel Ramos is a 69 y.o. male with previous medical history of SVT, hypertension, gout, heroin use, and depression presents today for initial evaluation and treatment of Hepatitis C.   Gabriel Ramos is newly diagnosed with Hepatitis C and noted to have a positive Hepatitis C antibody test and 239,000 IU viral load during routine screening with his primary care provider. He has used heroin in the past and has been clean for 2 years now. Denies tattoos, sharing of razors/toothbrushes, blood transfusions prior to 1992 or sexual contact with know positive partner. His wife was recently tested and she is negative. No family or personal or family history of liver disease, but is symptomatic with daily nausea and constipation. He also has acid reflux at times. Symptoms generally improved with a bowel movement. Denies upper abdominal pain, scleral icterus, or jaundice. He has not been treated to date. Currently smokes marijuana on a daily basis and drinks alcohol daily.    Allergies  Allergen Reactions  . Aspirin Shortness Of Breath and Swelling    REACTION: ASTHMA  . Penicillins Swelling    Makes lips and tongue swell up Has patient had a PCN reaction causing immediate rash, facial/tongue/throat swelling, SOB or lightheadedness with hypotension: yes Has patient had a PCN reaction causing severe rash involving mucus membranes or skin necrosis: no Has patient had a PCN reaction that required hospitalization: no Has patient had a PCN reaction occurring within the last 10 years: no, a little over 10 years If all of the above answers are "NO", then may proceed with Cephalosporin use.       Outpatient Medications Prior to Visit  Medication Sig Dispense Refill  . albuterol (PROAIR HFA) 108 (90 Base) MCG/ACT inhaler Inhale 2 puffs into  the lungs every 6 (six) hours as needed for wheezing or shortness of breath. 1 Inhaler 2  . albuterol (PROVENTIL) (2.5 MG/3ML) 0.083% nebulizer solution Take 3 mLs (2.5 mg total) by nebulization every 6 (six) hours as needed for wheezing or shortness of breath. 75 mL 1  . allopurinol (ZYLOPRIM) 100 MG tablet   1  . bisoprolol (ZEBETA) 5 MG tablet Take 1 tablet (5 mg total) by mouth daily. 30 tablet 11  . budesonide-formoterol (SYMBICORT) 160-4.5 MCG/ACT inhaler Inhale 2 puffs into the lungs 2 (two) times daily. 1 Inhaler 11  . Buprenorphine HCl-Naloxone HCl 12-3 MG FILM     . FLUoxetine (PROZAC) 20 MG capsule Take 20 mg by mouth daily.    . hydrOXYzine (VISTARIL) 25 MG capsule     . LOSARTAN POTASSIUM PO Take 25 mg by mouth daily.    . Nebulizers (NEBULIZER COMPRESSOR) MISC Use as directed Dx: 1 each 0  . ondansetron (ZOFRAN) 4 MG tablet Take 1 tablet (4 mg total) by mouth every 6 (six) hours. 12 tablet 0  . pantoprazole (PROTONIX) 40 MG tablet TAKE 1 TABLET (40 MG TOTAL) BY MOUTH DAILY. TAKE 30-60 MIN BEFORE FIRST MEAL OF THE DAY 90 tablet 1  . potassium chloride (K-DUR) 10 MEQ tablet Take 1 tablet (10 mEq total) by mouth daily. 10 tablet 0  . predniSONE (DELTASONE) 10 MG tablet Take   2 each am until better then 1 daily until return 60 tablet 1  . verapamil (CALAN-SR) 180 MG CR tablet Take 360  mg by mouth daily.  11  . clotrimazole (MYCELEX) 10 MG troche Use up to 4 x daily for sore throat 16 tablet 2  . montelukast (SINGULAIR) 10 MG tablet   0  . tamsulosin (FLOMAX) 0.4 MG CAPS capsule Take 0.4 mg by mouth daily.    . famotidine (PEPCID) 20 MG tablet TAKE 1 TABLET BY MOUTH EVERYDAY AT BEDTIME (Patient not taking: Reported on 08/06/2019) 90 tablet 1   No facility-administered medications prior to visit.     Past Medical History:  Diagnosis Date  . Abnormal CXR 09/16/2016   Report 09/07/16 c/w granuloma  . Alcohol dependence with unspecified alcohol-induced disorder ([place])   . Alcoholic  hepatitis without ascites   . ALLERGIC RHINITIS 09/14/2006   Qualifier: Diagnosis of  By: [name]    . Anxiety   . Asthma, chronic, severe persistent, uncomplicated 09/21/4079   Allergy profile 09/15/2016 >  Eos 0.5/  IgE  271 Grass > dog  - 09/15/2016    try symbicort 160 2bid  - 10/17/2016  After extensive coaching HFA effectiveness =    75%  (ti too short)  - sinus CT 03/02/2017 >  Pos polyp    . BPH (benign prostatic hyperplasia)   . Concentric left ventricular hypertrophy   . DEPRESSION 09/14/2006   Qualifier: Diagnosis of  By: [name]    . GERD (gastroesophageal reflux disease)   . Gout 03/28/2012  . History of paroxysmal supraventricular tachycardia   . Hypertension   . Hypoalbuminemia   . Hypokalemia   . Nausea   . Opioid abuse ([place])   . Simple chronic bronchitis ([place])   . SVT (supraventricular tachycardia) ([place])    2013,echo [place] NI LV EF ,mild MR: nl stress cl 3/13  . SVT (supraventricular tachycardia) ([place]) 08/12/2018   Cardiac work-up in 2013 normal  . Vertigo       Past Surgical History:  Procedure Laterality Date  . polyps remvoed from nose        Family History  Problem Relation Age of Onset  . Heart murmur Mother   . Heart disease Father   . Heart disease Brother   . Asthma Brother   . Cancer Brother        LIVER STAGE 4      Social History   Socioeconomic History  . Marital status: Significant Other    Spouse name: Not on file  . Number of children: Not on file  . Years of education: Not on file  . Highest education level: Not on file  Occupational History  . Not on file  Tobacco Use  . Smoking status: Never Smoker  . Smokeless tobacco: Never Used  Substance and Sexual Activity  . Alcohol use: Yes    Alcohol/week: 3.0 standard drinks    Types: 3 Shots of liquor per week    Comment: smirnoffs, BRANDY BOTTLE EVERY OTHER DAY  . Drug use: Yes    Frequency: 7.0 times per week    Types: Marijuana  . Sexual activity: Yes    Birth  control/protection: None  Other Topics Concern  . Not on file  Social History Narrative  . Not on file   Social Determinants of Health   Financial Resource Strain:   . Difficulty of Paying Living Expenses: Not on file  Food Insecurity:   . Worried About Charity fundraiser in the Last Year: Not on file  . Ran Out of Food in the Last Year: Not on  file  Transportation Needs:   . Film/video editor (Medical): Not on file  . Lack of Transportation (Non-Medical): Not on file  Physical Activity:   . Days of Exercise per Week: Not on file  . Minutes of Exercise per Session: Not on file  Stress:   . Feeling of Stress : Not on file  Social Connections:   . Frequency of Communication with Friends and Family: Not on file  . Frequency of Social Gatherings with Friends and Family: Not on file  . Attends Religious Services: Not on file  . Active Member of Clubs or Organizations: Not on file  . Attends Archivist Meetings: Not on file  . Marital Status: Not on file  Intimate Partner Violence:   . Fear of Current or Ex-Partner: Not on file  . Emotionally Abused: Not on file  . Physically Abused: Not on file  . Sexually Abused: Not on file      Review of Systems  Constitutional: Negative for chills, diaphoresis, fatigue and fever.  Respiratory: Negative for cough, chest tightness, shortness of breath and wheezing.   Cardiovascular: Negative for chest pain.  Gastrointestinal: Positive for constipation and nausea. Negative for abdominal distention, abdominal pain, diarrhea and vomiting.  Neurological: Negative for weakness and headaches.  Hematological: Does not bruise/bleed easily.       Objective:    BP 129/89   Pulse 91   Temp 97.6 F (36.4 C)   Ht _0  (1.727 m)   Wt 152 lb (68.9 kg)   SpO2 97%   BMI 23.11 kg/m  Nursing note and vital signs reviewed.  Physical Exam Constitutional:      General: He is not in acute distress.    Appearance: He is  well-developed. He is not ill-appearing.  Cardiovascular:     Rate and Rhythm: Normal rate and regular rhythm.     Heart sounds: Normal heart sounds. No murmur. No friction rub. No gallop.   Pulmonary:     Effort: Pulmonary effort is normal. No respiratory distress.     Breath sounds: Normal breath sounds. No wheezing or rales.  Chest:     Chest wall: No tenderness.  Abdominal:     General: Bowel sounds are normal. There is no distension.     Palpations: Abdomen is soft. There is no mass.     Tenderness: There is no abdominal tenderness. There is no guarding or rebound.  Skin:    General: Skin is warm and dry.  Neurological:     Mental Status: He is alert and oriented to person, place, and time.  Psychiatric:        Behavior: Behavior normal.        Thought Content: Thought content normal.        Judgment: Judgment normal.         Assessment & Plan:   Patient Active Problem List   Diagnosis Date Noted  . Chronic hepatitis C without hepatic coma ([place]) 08/07/2019  . Palpitations 08/12/2018  . SVT (supraventricular tachycardia) ([place]) 08/12/2018  . Upper respiratory infection 10/17/2016  . Essential hypertension 09/16/2016  . Abnormal CXR 09/16/2016  . Gout 03/28/2012  . DEPRESSION 09/14/2006  . ALLERGIC RHINITIS 09/14/2006  . Asthma, chronic, severe persistent, uncomplicated 23/76/2831     Problem List Items Addressed This Visit      Digestive   Chronic hepatitis C without hepatic coma ([place]) - Primary    Mr. Deiss is a 68 y/o male with Chronic Hepatitis  C with initial viral load of 239,000 IU/ml. Risk factors include age and history of heroin use. He is treatment naive and is symptomatic, however not fully likely to be related to his Hepatitis C. Discussed the pathophysiology, transmission, prevention, risks if left untreated and treatment options for Hepatitis C. Will check blood work today. HIV negative from previous blood work. He met with our pharmacy staff today to  complete financial assistance. Scheduled to see gastroenterology on Monday. Will likely treat with Mavyret pending blood work results.       Relevant Orders   Hepatic function panel (Completed)   Hepatitis B surface antibody,qualitative (Completed)   Hepatitis B surface antigen (Completed)   Hepatitis C genotype   Liver Fibrosis, FibroTest-ActiTest   Protime-INR (Completed)       I have discontinued Gwyndolyn Saxon Converse's famotidine. I am also having him maintain his verapamil, FLUoxetine, potassium chloride, ondansetron, albuterol, bisoprolol, clotrimazole, Nebulizer Compressor, allopurinol, montelukast, predniSONE, budesonide-formoterol, albuterol, pantoprazole, LOSARTAN POTASSIUM PO, tamsulosin, Buprenorphine HCl-Naloxone HCl, and hydrOXYzine.   Follow-up: Pending blood work results.    Terri Piedra, MSN, FNP-C Nurse Practitioner [facility] for [facility] Disease [place] number: [phone]

## 2019-08-06 NOTE — Patient Instructions (Signed)
Nice to see you.  We will check your blood work today and let you know the results.  Limit acetaminophen (Tylenol) usage to no more than 2 grams (2,000 mg) per day.  Avoid alcohol.  Do not share toothbrushes or razors.  Practice safe sex to protect against transmission as well as sexually transmitted disease.    Hepatitis C Hepatitis C is a viral infection of the liver. It can lead to scarring of the liver (cirrhosis), liver failure, or liver cancer. Hepatitis C may go undetected for months or years because people with the infection may not have symptoms, or they may have only mild symptoms. What are the causes? This condition is caused by the hepatitis C virus (HCV). The virus can spread from person to person (is contagious) through:  Blood.  Childbirth. A woman who has hepatitis C can pass it to her baby during birth.  Bodily fluids, such as breast milk, tears, semen, vaginal fluids, and saliva.  Blood transfusions or organ transplants done in the [place] before 1992.  What increases the risk? The following factors may make you more likely to develop this condition:  Having contact with unclean (contaminated) needles or syringes. This may result from: ? Acupuncture. ? Tattoing. ? Body piercing. ? Injecting drugs.  Having unprotected sex with someone who is infected.  Needing treatment to filter your blood (kidney dialysis).  Having HIV (human immunodeficiency virus) or AIDS (acquired immunodeficiency syndrome).  Working in a job that involves contact with blood or bodily fluids, such as health care.  What are the signs or symptoms? Symptoms of this condition include:  Fatigue.  Loss of appetite.  Nausea.  Vomiting.  Abdominal pain.  Dark yellow urine.  Yellowish skin and eyes (jaundice).  Itchy skin.  Clay-colored bowel movements.  Joint pain.  Bleeding and bruising easily.  Fluid building up in your stomach (ascites).  In some cases, you  may not have any symptoms. How is this diagnosed? This condition is diagnosed with:  Blood tests.  Other tests to check how well your liver is functioning. They may include: ? Magnetic resonance elastography (MRE). This imaging test uses MRIs and sound waves to measure liver stiffness. ? Transient elastography. This imaging test uses ultrasounds to measure liver stiffness. ? Liver biopsy. This test requires taking a small tissue sample from your liver to examine it under a microscope.  How is this treated? Your health care provider may perform noninvasive tests or a liver biopsy to help decide the best course of treatment. Treatment may include:  Antiviral medicines and other medicines.  Follow-up treatments every 6-12 months for infections or other liver conditions.  Receiving a donated liver (liver transplant).  Follow these instructions at home: Medicines  Take over-the-counter and prescription medicines only as told by your health care provider.  Take your antiviral medicine as told by your health care provider. Do not stop taking the antiviral even if you start to feel better.  Do not take any medicines unless approved by your health care provider, including over-the-counter medicines and birth control pills. Activity  Rest as needed.  Do not have sex unless approved by your health care provider.  Ask your health care provider when you may return to school or work. Eating and drinking  Eat a balanced diet with plenty of fruits and vegetables, whole grains, and lowfat (lean) meats or non-meat proteins (such as beans or tofu).  Drink enough fluids to keep your urine clear or pale yellow.  Do not drink alcohol. General instructions  Do not share toothbrushes, nail clippers, or razors.  Wash your hands frequently with soap and water. If soap and water are not available, use hand sanitizer.  Cover any cuts or open sores on your skin to prevent spreading the  virus.  Keep all follow-up visits as told by your health care provider. This is important. You may need follow-up visits every 6-12 months. How is this prevented? There is no vaccine for hepatitis C. The only way to prevent the disease is to reduce the risk of exposure to the virus. Make sure you:  Wash your hands frequently with soap and water. If soap and water are not available, use hand sanitizer.  Do not share needles or syringes.  Practice safe sex and use condoms.  Avoid handling blood or bodily fluids without gloves or other protection.  Avoid getting tattoos or piercings in shops or other locations that are not clean.  Contact a health care provider if:  You have a fever.  You develop abdominal pain.  You pass dark urine.  You pass clay-colored stools.  You develop joint pain. Get help right away if:  You have increasing fatigue or weakness.  You lose your appetite.  You cannot eat or drink without vomiting.  You develop jaundice or your jaundice gets worse.  You bruise or bleed easily. Summary  Hepatitis C is a viral infection of the liver. It can lead to scarring of the liver (cirrhosis), liver failure, or liver cancer.  The hepatitis C virus (HCV) causes this condition. The virus can pass from person to person (is contagious).  You should not take any medicines unless approved by your health care provider. This includes over-the-counter medicines and birth control pills. This information is not intended to replace advice given to you by your health care provider. Make sure you discuss any questions you have with your health care provider. Document Released: 05/20/2000 Document Revised: 06/28/2016 Document Reviewed: 06/28/2016 Elsevier Interactive Patient Education  [employer].

## 2019-08-07 ENCOUNTER — Encounter: Payer: Self-pay | Admitting: Family

## 2019-08-07 DIAGNOSIS — B182 Chronic viral hepatitis C: Secondary | ICD-10-CM | POA: Insufficient documentation

## 2019-08-07 NOTE — Assessment & Plan Note (Signed)
Gabriel Ramos is a 69 y/o male with Chronic Hepatitis C with initial viral load of 239,000 IU/ml. Risk factors include age and history of heroin use. He is treatment naive and is symptomatic, however not fully likely to be related to his Hepatitis C. Discussed the pathophysiology, transmission, prevention, risks if left untreated and treatment options for Hepatitis C. Will check blood work today. HIV negative from previous blood work. He met with our pharmacy staff today to complete financial assistance. Scheduled to see gastroenterology on Monday. Will likely treat with Mavyret pending blood work results.

## 2019-08-09 LAB — HEPATIC FUNCTION PANEL
AG Ratio: 1.6 (calc) (ref 1.0–2.5)
ALT: 34 U/L (ref 9–46)
AST: 52 U/L — ABNORMAL HIGH (ref 10–35)
Albumin: 3.1 g/dL — ABNORMAL LOW (ref 3.6–5.1)
Alkaline phosphatase (APISO): 76 U/L (ref 35–144)
Bilirubin, Direct: 0.2 mg/dL (ref 0.0–0.2)
Globulin: 2 g/dL (calc) (ref 1.9–3.7)
Indirect Bilirubin: 0.3 mg/dL (calc) (ref 0.2–1.2)
Total Bilirubin: 0.5 mg/dL (ref 0.2–1.2)
Total Protein: 5.1 g/dL — ABNORMAL LOW (ref 6.1–8.1)

## 2019-08-09 LAB — LIVER FIBROSIS, FIBROTEST-ACTITEST
ALT: 34 U/L (ref 9–46)
Alpha-2-Macroglobulin: 178 mg/dL (ref 106–279)
Apolipoprotein A1: 123 mg/dL (ref 94–176)
Bilirubin: 0.5 mg/dL (ref 0.2–1.2)
Fibrosis Score: 0.52
GGT: 148 U/L — ABNORMAL HIGH (ref 3–70)
Haptoglobin: 104 mg/dL (ref 43–212)
Necroinflammat ACT Score: 0.23
Reference ID: 3293083

## 2019-08-09 LAB — PROTIME-INR
INR: 1
Prothrombin Time: 10.2 s (ref 9.0–11.5)

## 2019-08-09 LAB — HEPATITIS B SURFACE ANTIGEN: Hepatitis B Surface Ag: NONREACTIVE

## 2019-08-09 LAB — HEPATITIS B SURFACE ANTIBODY,QUALITATIVE: Hep B S Ab: NONREACTIVE

## 2019-08-09 LAB — HEPATITIS C GENOTYPE: HCV Genotype: 3

## 2019-08-12 ENCOUNTER — Ambulatory Visit: Payer: Medicare HMO | Admitting: Physician Assistant

## 2019-08-12 ENCOUNTER — Other Ambulatory Visit: Payer: Self-pay

## 2019-08-12 ENCOUNTER — Encounter: Payer: Self-pay | Admitting: Physician Assistant

## 2019-08-12 VITALS — BP 130/90 | HR 92 | Temp 98.2°F | Ht 67.5 in | Wt 150.0 lb

## 2019-08-12 DIAGNOSIS — R112 Nausea with vomiting, unspecified: Secondary | ICD-10-CM | POA: Diagnosis not present

## 2019-08-12 DIAGNOSIS — F1010 Alcohol abuse, uncomplicated: Secondary | ICD-10-CM | POA: Diagnosis not present

## 2019-08-12 DIAGNOSIS — Z01818 Encounter for other preprocedural examination: Secondary | ICD-10-CM

## 2019-08-12 DIAGNOSIS — Z87898 Personal history of other specified conditions: Secondary | ICD-10-CM

## 2019-08-12 DIAGNOSIS — Z1211 Encounter for screening for malignant neoplasm of colon: Secondary | ICD-10-CM

## 2019-08-12 DIAGNOSIS — F1290 Cannabis use, unspecified, uncomplicated: Secondary | ICD-10-CM

## 2019-08-12 DIAGNOSIS — Z1212 Encounter for screening for malignant neoplasm of rectum: Secondary | ICD-10-CM | POA: Diagnosis not present

## 2019-08-12 DIAGNOSIS — F1191 Opioid use, unspecified, in remission: Secondary | ICD-10-CM

## 2019-08-12 MED ORDER — NA SULFATE-K SULFATE-MG SULF 17.5-3.13-1.6 GM/177ML PO SOLN: 1.0000 | Freq: Once | ORAL | 0 refills | Status: AC

## 2019-08-12 NOTE — Patient Instructions (Signed)
If you are age 67 or older, your body mass index should be between 23-30. Your Body mass index is 23.15 kg/m. If this is out of the aforementioned range listed, please consider follow up with your Primary Care Provider.  If you are age 29 or younger, your body mass index should be between 19-25. Your Body mass index is 23.15 kg/m. If this is out of the aformentioned range listed, please consider follow up with your Primary Care Provider.   You have been scheduled for an endoscopy and colonoscopy. Please follow the written instructions given to you at your visit today. Please pick up your prep supplies at the pharmacy within the next 1-3 days. If you use inhalers (even only as needed), please bring them with you on the day of your procedure.  STOP marijuana and alcohol use.  Your follow up will be with Dr. Wilfrid Lund, pending the results of your colonoscopy/EGD.  Please fill out and submit your [facility] Assistance forms prior to your procedures.

## 2019-08-12 NOTE — Progress Notes (Signed)
Chief Complaint: Vomiting  HPI:    Gabriel Ramos is a 69 year old male with a past medical history as listed below including SVT, hypertension, gout, heroin use and depression as well as hepatitis C, who was referred to me by Hulan Fess, MD for a complaint of vomiting.      11/30/2016 abdominal ultrasound with normal sonographic appearance of the liver.    07/31/2019 patient saw PCP.  That time was noted patient had a history of alcohol and opioid dependency.  In 2019 the patient was having weight loss and had a history of alcoholic hepatitis and was referred to a gastroenterologist for colonoscopy and upper endoscopy but canceled due to cost.  In February 2020 patient's liver functions had returned to normal after patient had stopped drinking alcohol.  Echocardiogram 2017 with LV H with ejection fraction 55%.  At that appointment patient described having dry heaves/vomiting for several months on a regular basis, usually daily in the morning but better as the day went on.  Describes drinking a bottle of brandy a week but had stopped 2 weeks prior to that.  Continued Pantoprazole for chronic reflux.  On Nalaxone medications through Dr. Berkley Harvey at the [facility] wellness clinic for history of opioid addiction.  At that time they were wanting to rule out alcoholic hepatitis and/or cirrhosis.    07/31/2019 labs show liver functions with an AST minimally elevated at 69 and others otherwise normal.  Albumin low at 3.1.  CBC with a hemoglobin of 12.4 normal platelets at 287.  Ferritin elevated at 644.3.  Amylase and lipase normal.    08/06/2019 patient seen by infectious disease for treatment of his hepatitis C.  Describes smoking marijuana on a daily basis and drinking alcohol daily.    08/06/2019 fibrosis score/stage it is F2.    Today, the patient presents to clinic and explains that his largest complaint is that he is constantly nauseous and has multiple episodes of vomiting a day, tells me has been happening for the  past 6 to 7 months and has been increasing in frequency.  He started getting nauseous if he sleeps flat so he sleeps sitting up, tells me that within minutes of waking up he can become so nauseous that he starts vomiting a "yellow bilious material", this can last for up to 6 hours and he will vomit every 10 to 15 minutes.  Over the past 5 to 6 days this is slightly better since being started on Hydroxyzine 25 mg nightly for sleep by his PCP.  Does tell me that he takes a couple shots of brandy every morning and smokes marijuana in the morning.  Tells me he was doing this for his nausea.  Has not had to use marijuana in the past 5 to 6 days.    Describes that previously he was supposed to have a colonoscopy and endoscopy but cost is somewhat prohibitive to him.    Denies fever, chills, weight loss or symptoms that awaken him from sleep.  Past Medical History:  Diagnosis Date  . Abnormal CXR 09/16/2016   Report 09/07/16 c/w granuloma  . Alcohol dependence with unspecified alcohol-induced disorder ([place])   . Alcoholic hepatitis without ascites   . ALLERGIC RHINITIS 09/14/2006   Qualifier: Diagnosis of  By: [name]    . Anxiety   . Asthma, chronic, severe persistent, uncomplicated 0000000   Allergy profile 09/15/2016 >  Eos 0.5/  IgE  271 Grass > dog  - 09/15/2016    try  symbicort 160 2bid  - 10/17/2016  After extensive coaching HFA effectiveness =    75%  (ti too short)  - sinus CT 03/02/2017 >  Pos polyp    . BPH (benign prostatic hyperplasia)   . Concentric left ventricular hypertrophy   . DEPRESSION 09/14/2006   Qualifier: Diagnosis of  By: [name]    . GERD (gastroesophageal reflux disease)   . Gout 03/28/2012  . History of paroxysmal supraventricular tachycardia   . Hypertension   . Hypoalbuminemia   . Hypokalemia   . Nausea   . Opioid abuse ([place])   . Simple chronic bronchitis ([place])   . SVT (supraventricular tachycardia) ([place])    2013,echo [place] NI LV EF ,mild MR: nl stress cl  3/13  . SVT (supraventricular tachycardia) ([place]) 08/12/2018   Cardiac work-up in 2013 normal  . Vertigo     Past Surgical History:  Procedure Laterality Date  . polyps remvoed from nose      Current Outpatient Medications  Medication Sig Dispense Refill  . albuterol (PROAIR HFA) 108 (90 Base) MCG/ACT inhaler Inhale 2 puffs into the lungs every 6 (six) hours as needed for wheezing or shortness of breath. 1 Inhaler 2  . albuterol (PROVENTIL) (2.5 MG/3ML) 0.083% nebulizer solution Take 3 mLs (2.5 mg total) by nebulization every 6 (six) hours as needed for wheezing or shortness of breath. (Patient taking differently: Take 2.5 mg by nebulization daily. ) 75 mL 1  . allopurinol (ZYLOPRIM) 100 MG tablet   1  . bisoprolol (ZEBETA) 5 MG tablet Take 1 tablet (5 mg total) by mouth daily. 30 tablet 11  . Buprenorphine HCl-Naloxone HCl 12-3 MG FILM in the morning and at bedtime.     Marland Kitchen FLUoxetine (PROZAC) 20 MG capsule Take 20 mg by mouth daily.    . hydrOXYzine (VISTARIL) 25 MG capsule at bedtime.     Marland Kitchen LOSARTAN POTASSIUM PO Take 25 mg by mouth daily.    . montelukast (SINGULAIR) 10 MG tablet 10 mg daily.   0  . Nebulizers (NEBULIZER COMPRESSOR) MISC Use as directed Dx: 1 each 0  . ondansetron (ZOFRAN) 4 MG tablet Take 1 tablet (4 mg total) by mouth every 6 (six) hours. 12 tablet 0  . pantoprazole (PROTONIX) 40 MG tablet TAKE 1 TABLET (40 MG TOTAL) BY MOUTH DAILY. TAKE 30-60 MIN BEFORE FIRST MEAL OF THE DAY 90 tablet 1  . potassium chloride (K-DUR) 10 MEQ tablet Take 1 tablet (10 mEq total) by mouth daily. 10 tablet 0  . predniSONE (DELTASONE) 10 MG tablet Take   2 each am until better then 1 daily until return (Patient taking differently: 10 mg daily. Take   2 each am until better then 1 daily until return) 60 tablet 1  . tamsulosin (FLOMAX) 0.4 MG CAPS capsule Take 0.4 mg by mouth daily.    . verapamil (CALAN-SR) 180 MG CR tablet Take 360 mg by mouth daily.  11   No current facility-administered  medications for this visit.    Allergies as of 08/12/2019 - Review Complete 08/12/2019  Allergen Reaction Noted  . Aspirin Shortness Of Breath and Swelling 10/27/2006  . Penicillins Swelling 03/23/2012    Family History  Problem Relation Age of Onset  . Heart murmur Mother   . Heart disease Father   . Heart disease Brother   . Asthma Brother   . Cancer Brother        LIVER STAGE 4    Social History  Socioeconomic History  . Marital status: Significant Other    Spouse name: Not on file  . Number of children: Not on file  . Years of education: Not on file  . Highest education level: Not on file  Occupational History  . Not on file  Tobacco Use  . Smoking status: Never Smoker  . Smokeless tobacco: Never Used  Substance and Sexual Activity  . Alcohol use: Yes    Alcohol/week: 3.0 standard drinks    Types: 3 Shots of liquor per week    Comment: smirnoffs, BRANDY BOTTLE EVERY OTHER DAY  . Drug use: Yes    Frequency: 7.0 times per week    Types: Marijuana  . Sexual activity: Yes    Birth control/protection: None  Other Topics Concern  . Not on file  Social History Narrative  . Not on file   Social Determinants of Health   Financial Resource Strain:   . Difficulty of Paying Living Expenses: Not on file  Food Insecurity:   . Worried About Charity fundraiser in the Last Year: Not on file  . Ran Out of Food in the Last Year: Not on file  Transportation Needs:   . Lack of Transportation (Medical): Not on file  . Lack of Transportation (Non-Medical): Not on file  Physical Activity:   . Days of Exercise per Week: Not on file  . Minutes of Exercise per Session: Not on file  Stress:   . Feeling of Stress : Not on file  Social Connections:   . Frequency of Communication with Friends and Family: Not on file  . Frequency of Social Gatherings with Friends and Family: Not on file  . Attends Religious Services: Not on file  . Active Member of Clubs or Organizations: Not  on file  . Attends Archivist Meetings: Not on file  . Marital Status: Not on file  Intimate Partner Violence:   . Fear of Current or Ex-Partner: Not on file  . Emotionally Abused: Not on file  . Physically Abused: Not on file  . Sexually Abused: Not on file    Review of Systems:    Constitutional: No weight loss, fever or chills Skin: No rash  Cardiovascular: No chest pain  Respiratory: No SOB  Gastrointestinal: See HPI and otherwise negative Genitourinary: No dysuria  Neurological: No headache Musculoskeletal: No new muscle or joint pain Hematologic: No bleeding  Psychiatric: No history of depression or anxiety   Physical Exam:  Vital signs: BP 130/90   Pulse 92   Temp 98.2 F (36.8 C)   Ht 5' 7.5" (1.715 m)   Wt 150 lb (68 kg)   BMI 23.15 kg/m   Constitutional:   Pleasant Caucasian male appears to be in NAD, Well developed, Well nourished, alert and cooperative Head:  Normocephalic and atraumatic. Eyes:   PEERL, EOMI. No icterus. Conjunctiva pink. Ears:  Normal auditory acuity. Neck:  Supple Throat: Oral cavity and pharynx without inflammation, swelling or lesion.  Respiratory: Respirations even and unlabored. Lungs clear to auscultation bilaterally.   No wheezes, crackles, or rhonchi.  Cardiovascular: Normal S1, S2. No MRG. Regular rate and rhythm. No peripheral edema, cyanosis or pallor.  Gastrointestinal:  Soft, nondistended, mild epigastric ttp. No rebound or guarding. Normal bowel sounds. No appreciable masses or hepatomegaly. Rectal:  Not performed.  Msk:  Symmetrical without gross deformities. Without edema, no deformity or joint abnormality.  Neurologic:  Alert and  oriented x4;  grossly normal neurologically.  Skin:  Dry and intact without significant lesions or rashes. Psychiatric: Demonstrates good judgement and reason without abnormal affect or behaviors.  See HPI for recent labs.  Assessment: 1.  Nausea and vomiting: Over the past 6 to 7  months, somewhat better over the past 5 to 6 days with hydroxyzine at night; consider relation to marijuana/alcohol/gastritis 2.  Chronic hepatitis C: Just started seeing infectious disease who is planning on treating this 3.  Alcohol abuse: Continues to drink 1-2 shots of brandy per day 4.  History of heroin use 5.  Marijuana use: Daily 6.  Screening for colorectal cancer  Plan: 1.  Discussed with patient that he is due for a colon cancer screening, this should be covered by his insurance but he can call and make sure.  Would also recommend he have an EGD given nausea and vomiting and history of hepatitis.  Scheduled patient for an EGD and screening colonoscopy in the [place] with Dr. Loletha Carrow.  Did discuss risks, benefits, limitiations and alternatives and the patient agrees to proceed.  He will be Covid tested 2 days prior to time of procedure. 2.  Would recommend the patient have imaging of his liver, it has been 3 years since he has had an ultrasound and he has continued to abuse his liver with alcohol and drugs.  Patient is aware but tells me he cannot afford all of this testing at once. 3.  Provided the patient with an information in regards to orange card as well as [facility] assistance. 4.  Recommend the patient stop marijuana use, did discuss hyperemesis. 5.  Would recommend the patient stop drinking. 6.  Congratulated the patient that he had stopped heroin. 7.  Patient to follow in clinic per recommendations after procedures above.  Again he will likely need further work-up for his liver, but tells me he cannot afford any of this right now.  Ellouise Newer, PA-C [facility] 08/12/2019, 11:37 AM  Cc: Hulan Fess, MD

## 2019-08-12 NOTE — Progress Notes (Signed)
____________________________________________________________  Attending physician addendum:  Thank you for sending this case to me. I have reviewed the entire note and the plan  EGD indicated for chronic nausea and vomiting, though symptoms seem most likely due to ongoing alcohol abuse.   If patient concerned about finances, or if unable to tolerate bowel preparation due to UGI symptoms, then EGD alone can be done.  Wilfrid Lund, MD  ____________________________________________________________

## 2019-08-14 ENCOUNTER — Other Ambulatory Visit: Payer: Self-pay | Admitting: Pharmacist

## 2019-08-14 DIAGNOSIS — B182 Chronic viral hepatitis C: Secondary | ICD-10-CM

## 2019-08-14 MED ORDER — MAVYRET 100-40 MG PO TABS: 3.0000 | ORAL_TABLET | Freq: Every day | ORAL | 1 refills | Status: DC

## 2019-08-14 NOTE — Progress Notes (Addendum)
Patient's labs have returned. Will send in [place] x 8 weeks and Adonis Brook will begin PA process.

## 2019-08-19 ENCOUNTER — Encounter: Payer: Self-pay | Admitting: Gastroenterology

## 2019-08-20 ENCOUNTER — Other Ambulatory Visit: Payer: Self-pay | Admitting: Gastroenterology

## 2019-08-20 ENCOUNTER — Ambulatory Visit (INDEPENDENT_AMBULATORY_CARE_PROVIDER_SITE_OTHER): Payer: Medicare HMO

## 2019-08-20 DIAGNOSIS — Z1159 Encounter for screening for other viral diseases: Secondary | ICD-10-CM | POA: Diagnosis not present

## 2019-08-21 LAB — SARS CORONAVIRUS 2 (TAT 6-24 HRS): SARS Coronavirus 2: NEGATIVE

## 2019-08-22 ENCOUNTER — Ambulatory Visit (AMBULATORY_SURGERY_CENTER): Payer: Medicare HMO | Admitting: Gastroenterology

## 2019-08-22 ENCOUNTER — Other Ambulatory Visit: Payer: Self-pay

## 2019-08-22 ENCOUNTER — Encounter: Payer: Self-pay | Admitting: Gastroenterology

## 2019-08-22 VITALS — BP 110/78 | HR 100 | Temp 96.9°F | Resp 12 | Ht 67.5 in | Wt 150.0 lb

## 2019-08-22 DIAGNOSIS — Z1211 Encounter for screening for malignant neoplasm of colon: Secondary | ICD-10-CM | POA: Diagnosis not present

## 2019-08-22 DIAGNOSIS — Z1212 Encounter for screening for malignant neoplasm of rectum: Secondary | ICD-10-CM

## 2019-08-22 DIAGNOSIS — K222 Esophageal obstruction: Secondary | ICD-10-CM | POA: Diagnosis not present

## 2019-08-22 DIAGNOSIS — K3189 Other diseases of stomach and duodenum: Secondary | ICD-10-CM

## 2019-08-22 DIAGNOSIS — D123 Benign neoplasm of transverse colon: Secondary | ICD-10-CM | POA: Diagnosis not present

## 2019-08-22 DIAGNOSIS — R112 Nausea with vomiting, unspecified: Secondary | ICD-10-CM

## 2019-08-22 MED ORDER — SODIUM CHLORIDE 0.9 % IV SOLN: 500.0000 mL | Freq: Once | INTRAVENOUS | Status: DC

## 2019-08-22 NOTE — Progress Notes (Signed)
To PACU, VSS. Report to RN. Right front eye tooth extremely loose, projecting foraward, at the end of case. It had a visible crack at the gum line, the decision was made to pull the tooth so he wouldn't swallow it while he was waking up. Tooth extracted by Dr Loletha Carrow. There was an in depth discussion that this was a possibility before the case started and the pt wanted to proceed.tb

## 2019-08-22 NOTE — Op Note (Signed)
[place] Patient Name: Gabriel Ramos Procedure Date: 08/22/2019 2:49 PM MRN: [id number] Endoscopist: Mallie Mussel L. Loletha Carrow , MD Age: 69 Referring MD:  Date of Birth: Jun 01, 1951 Gender: Male Account #: [id number] Procedure:                Colonoscopy Indications:              Screening for colorectal malignant neoplasm, This                            is the patient's first colonoscopy Medicines:                Monitored Anesthesia Care Procedure:                Pre-Anesthesia Assessment:                           - Prior to the procedure, a History and Physical                            was performed, and patient medications and                            allergies were reviewed. The patient's tolerance of                            previous anesthesia was also reviewed. The risks                            and benefits of the procedure and the sedation                            options and risks were discussed with the patient.                            All questions were answered, and informed consent                            was obtained. Prior Anticoagulants: The patient has                            taken no previous anticoagulant or antiplatelet                            agents. ASA Grade Assessment: III - A patient with                            severe systemic disease. After reviewing the risks                            and benefits, the patient was deemed in                            satisfactory condition to undergo the procedure.  After obtaining informed consent, the colonoscope                            was passed under direct vision. Throughout the                            procedure, the patient's blood pressure, pulse, and                            oxygen saturations were monitored continuously. The                            Colonoscope was introduced through the anus and                            advanced to the the  cecum, identified by                            appendiceal orifice and ileocecal valve. The                            colonoscopy was performed without difficulty. The                            patient tolerated the procedure well. The quality                            of the bowel preparation was good. The ileocecal                            valve, appendiceal orifice, and rectum were                            photographed. The bowel preparation used was SUPREP. Scope In: 3:01:30 PM Scope Out: 3:14:53 PM Scope Withdrawal Time: 0 hours 9 minutes 47 seconds  Total Procedure Duration: 0 hours 13 minutes 23 seconds  Findings:                 The perianal and digital rectal examinations were                            normal.                           A diminutive polyp was found in the transverse                            colon. The polyp was sessile. The polyp was removed                            with a cold biopsy forceps. Resection and retrieval                            were complete.  Multiple diverticula were found in the left colon.                           The exam was otherwise without abnormality on                            direct and retroflexion views. Complications:            No immediate complications. Estimated Blood Loss:     Estimated blood loss was minimal. Impression:               - One diminutive polyp in the transverse colon,                            removed with a cold biopsy forceps. Resected and                            retrieved.                           - Diverticulosis in the left colon.                           - The examination was otherwise normal on direct                            and retroflexion views. Recommendation:           - Patient has a contact number available for                            emergencies. The signs and symptoms of potential                            delayed complications were discussed  with the                            patient. Return to normal activities tomorrow.                            Written discharge instructions were provided to the                            patient.                           - Resume previous diet.                           - Continue present medications.                           - Await pathology results.                           - Repeat colonoscopy is recommended for  surveillance. The colonoscopy date will be                            determined after pathology results from today's                            exam become available for review.                           - See the other procedure note for documentation of                            additional recommendations. Khristy Kalan L. Loletha Carrow, MD 08/22/2019 3:34:02 PM This report has been signed electronically.

## 2019-08-22 NOTE — Op Note (Signed)
[place] Patient Name: Gabriel Ramos Procedure Date: 08/22/2019 2:49 PM MRN: [id number] Endoscopist: Mallie Mussel L. Loletha Carrow , MD Age: 69 Referring MD:  Date of Birth: [birth year] Gender: Male Account #: [id number] Procedure:                Upper GI endoscopy Indications:              Nausea with vomiting Medicines:                Monitored Anesthesia Care Procedure:                Pre-Anesthesia Assessment:                           - Prior to the procedure, a History and Physical                            was performed, and patient medications and                            allergies were reviewed. The patient's tolerance of                            previous anesthesia was also reviewed. The risks                            and benefits of the procedure and the sedation                            options and risks were discussed with the patient.                            All questions were answered, and informed consent                            was obtained. Prior Anticoagulants: The patient has                            taken no previous anticoagulant or antiplatelet                            agents. ASA Grade Assessment: III - A patient with                            severe systemic disease. After reviewing the risks                            and benefits, the patient was deemed in                            satisfactory condition to undergo the procedure.                           After obtaining informed consent, the endoscope was  passed under direct vision. Throughout the                            procedure, the patient's blood pressure, pulse, and                            oxygen saturations were monitored continuously. The                            Endoscope was introduced through the mouth, and                            advanced to the second part of duodenum. The upper                            GI endoscopy was accomplished  without difficulty.                            The patient tolerated the procedure well. Scope In: Scope Out: Findings:                 A moderate Schatzki ring was found at the                            gastroesophageal junction. This was biopsied with a                            cold forceps for disruption of ring - tissue not                            sent to pathology.                           The exam of the esophagus was otherwise normal.                           Diffuse erythematous and edematous mucosa was found                            in the entire examined stomach. Biopsies were taken                            with a cold forceps for histology. ([place]                            protocol).                           The cardia and gastric fundus were normal on                            retroflexion.                           The examined duodenum was normal. Complications:  No immediate complications. Estimated Blood Loss:     Estimated blood loss was minimal. Impression:               - Moderate Schatzki ring. Biopsied.                           - Erythematous mucosa in the stomach. Biopsied.                           - Normal examined duodenum. Recommendation:           - Patient has a contact number available for                            emergencies. The signs and symptoms of potential                            delayed complications were discussed with the                            patient. Return to normal activities tomorrow.                            Written discharge instructions were provided to the                            patient.                           - Resume previous diet.                           - Continue present medications, including                            ondansetron for nausea.                           - Discontinue alcohol consumption.                           - Await pathology results. Gabriel Ramos L. Loletha Carrow, MD 08/22/2019  3:38:06 PM This report has been signed electronically.

## 2019-08-22 NOTE — Progress Notes (Signed)
Called to room to assist during endoscopic procedure.  Patient ID and intended procedure confirmed with present staff. Received instructions for my participation in the procedure from the performing physician.  

## 2019-08-22 NOTE — Progress Notes (Signed)
Pt's states no medical or surgical changes since previsit or office visit.  Temp LC, VS CW 

## 2019-08-22 NOTE — Patient Instructions (Signed)
Please, read all of the handouts given to you by your recovery room nurse.  Take all of your prescribed medications as ordered.  YOU HAD AN ENDOSCOPIC PROCEDURE TODAY AT [facility]:   Refer to the procedure report that was given to you for any specific questions about what was found during the examination.  If the procedure report does not answer your questions, please call your gastroenterologist to clarify.  If you requested that your care partner not be given the details of your procedure findings, then the procedure report has been included in a sealed envelope for you to review at your convenience later.  YOU SHOULD EXPECT: Some feelings of bloating in the abdomen. Passage of more gas than usual.  Walking can help get rid of the air that was put into your GI tract during the procedure and reduce the bloating. If you had a lower endoscopy (such as a colonoscopy or flexible sigmoidoscopy) you may notice spotting of blood in your stool or on the toilet paper. If you underwent a bowel prep for your procedure, you may not have a normal bowel movement for a few days.  Please Note:  You might notice some irritation and congestion in your nose or some drainage.  This is from the oxygen used during your procedure.  There is no need for concern and it should clear up in a day or so.  SYMPTOMS TO REPORT IMMEDIATELY:   Following lower endoscopy (colonoscopy or flexible sigmoidoscopy):  Excessive amounts of blood in the stool  Significant tenderness or worsening of abdominal pains  Swelling of the abdomen that is new, acute  Fever of 100F or higher   Following upper endoscopy (EGD)  Vomiting of blood or coffee ground material  New chest pain or pain under the shoulder blades  Painful or persistently difficult swallowing  New shortness of breath  Fever of 100F or higher  Black, tarry-looking stools  For urgent or emergent issues, a gastroenterologist can be reached at any hour by  calling [phone]. Do not use MyChart messaging for urgent concerns.    DIET:  We do recommend a small meal at first, but then you may proceed to your regular diet.  Drink plenty of fluids but you should avoid alcoholic beverages for 24 hours.  ACTIVITY:  You should plan to take it easy for the rest of today and you should NOT DRIVE or use heavy machinery until tomorrow (because of the sedation medicines used during the test).    FOLLOW UP: Our staff will call the number listed on your records 48-72 hours following your procedure to check on you and address any questions or concerns that you may have regarding the information given to you following your procedure. If we do not reach you, we will leave a message.  We will attempt to reach you two times.  During this call, we will ask if you have developed any symptoms of COVID 19. If you develop any symptoms (ie: fever, flu-like symptoms, shortness of breath, cough etc.) before then, please call [phone].  If you test positive for Covid 19 in the 2 weeks post procedure, please call and report this information to [place].    If any biopsies were taken you will be contacted by phone or by letter within the next 1-3 weeks.  Please call us at [phone] if you have not heard about the biopsies in 3 weeks.    SIGNATURES/CONFIDENTIALITY: You and/or your care partner have  signed paperwork which will be entered into your electronic medical record.  These signatures attest to the fact that that the information above on your After Visit Summary has been reviewed and is understood.  Full responsibility of the confidentiality of this discharge information lies with you and/or your care-partner.

## 2019-08-23 ENCOUNTER — Telehealth: Payer: Self-pay | Admitting: Pharmacy Technician

## 2019-08-23 NOTE — Telephone Encounter (Addendum)
[facility] Patient Advocate Encounter   Received notification from [facility] that prior authorization for Ellis is required.   PA submitted on 08/23/2019 Key [id number] Status is pending [facility] will continue to follow. Raeanne Gathers and Harvoni are the preferred medication for [facility]. The request today had to be sent to explain why the patient will not be stopping the PPI, Protonix.   Reached the patient and made him aware it is currently 3-7 days processing and we will reach out once approved. He prefers the medication to be shipped.   Bartholomew Crews, CPhT Specialty Pharmacy Patient [facility] for Infectious Disease Phone: [phone] Fax: [phone] 08/23/2019 9:34 AM

## 2019-08-26 ENCOUNTER — Telehealth: Payer: Self-pay | Admitting: *Deleted

## 2019-08-26 MED FILL — MAVYRET 100-40 MG TABS: 100-40 | 28 days supply | Qty: 84 | Fill #0

## 2019-08-26 NOTE — Telephone Encounter (Signed)
  Follow up Call-  Call back number 08/22/2019  Post procedure Call Back phone  # [phone] (Patty-friend)  Permission to leave phone message Yes  Some recent data might be hidden     Patient questions:  Do you have a fever, pain , or abdominal swelling? No. Pain Score  0 *  Have you tolerated food without any problems? Yes.    Have you been able to return to your normal activities? Yes.    Do you have any questions about your discharge instructions: Diet   No. Medications  No. Follow up visit  No.  Do you have questions or concerns about your Care? No.  Actions: * If pain score is 4 or above: No action needed, pain <4.  1. Have you developed a fever since your procedure? no  2.   Have you had an respiratory symptoms (SOB or cough) since your procedure? no  3.   Have you tested positive for COVID 19 since your procedure no  4.   Have you had any family members/close contacts diagnosed with the COVID 19 since your procedure?  no   If yes to any of these questions please route to Joylene John, RN and Alphonsa Gin, Therapist, sports.

## 2019-08-26 NOTE — Telephone Encounter (Addendum)
[facility] Patient Advocate Encounter  Prior Authorization for Mavyret has been approved.    PA# [id number] Effective dates: 08/23/2019 through 10/18/2019 [id number]  Patients co-pay is $1930.80   [facility] Patient Advocate Encounter   I was successful in securing patient a $30,000 grant from [employer] to provide copayment coverage for [employer]. This will make the out of pocket cost $0.     I have spoken with the patient and he will have the medication mailed today from [place] and begin taking 03/23.    The billing information is: RxBin: [id number] PCN: [id number] Member ID: [id number] Group ID: [id number] Dates of Eligibility: 07/27/2019 through 07/25/2020  Patient knows to call the office with questions or concerns.  Bartholomew Crews, CPhT Specialty Pharmacy Patient [facility] for Infectious Disease Phone: [phone] Fax: [phone] 08/26/2019 9:30 AM

## 2019-08-27 ENCOUNTER — Encounter: Payer: Self-pay | Admitting: Gastroenterology

## 2019-08-27 ENCOUNTER — Encounter: Payer: Self-pay | Admitting: Internal Medicine

## 2019-08-27 ENCOUNTER — Telehealth: Payer: Self-pay

## 2019-08-27 ENCOUNTER — Ambulatory Visit: Payer: Medicare HMO | Admitting: Internal Medicine

## 2019-08-27 ENCOUNTER — Other Ambulatory Visit: Payer: Self-pay

## 2019-08-27 VITALS — BP 140/85 | HR 99 | Temp 97.3°F | Ht 69.0 in | Wt 147.0 lb

## 2019-08-27 DIAGNOSIS — J4540 Moderate persistent asthma, uncomplicated: Secondary | ICD-10-CM

## 2019-08-27 DIAGNOSIS — J339 Nasal polyp, unspecified: Secondary | ICD-10-CM | POA: Diagnosis not present

## 2019-08-27 MED ORDER — ALBUTEROL SULFATE HFA 108 (90 BASE) MCG/ACT IN AERS: 2.0000 | INHALATION_SPRAY | Freq: Four times a day (QID) | RESPIRATORY_TRACT | 5 refills | Status: DC | PRN

## 2019-08-27 MED ORDER — FLUTICASONE PROPIONATE 50 MCG/ACT NA SUSP: 1.0000 | Freq: Two times a day (BID) | NASAL | 5 refills | Status: DC

## 2019-08-27 MED ORDER — FLUTICASONE-SALMETEROL 100-50 MCG/DOSE IN AEPB: 1.0000 | INHALATION_SPRAY | Freq: Two times a day (BID) | RESPIRATORY_TRACT | 5 refills | Status: DC

## 2019-08-27 NOTE — Telephone Encounter (Signed)
Agree with Greg's counseling points.

## 2019-08-27 NOTE — Telephone Encounter (Signed)
Mavyret x 8 weeks counseling  Spoke with WS and his wife after verifying 2 unique identifiers.   Informed WS that he should be receiving the medication today, 3/23//21. His plan was to start taking the medication tonight.  I stressed the importance of taking all 3 pills at the same time each day with food. Adherence was the main focus of the conversation. I also covered ADE and informed him of no concerning DDI's with his current medication list. We discussed the f/u schedule as well.   WS and his wife had a a few concerns about his lab results, GI exacerbations, and Hepatitis B status.  -Labs were discussed -GI exacerbations are possible  -Heplisav B is needed  After all concerns were addressed patient was told to call us with any further concerns/questions.   Ned Clines Student Pharmacist, Class of [phone]

## 2019-08-27 NOTE — Progress Notes (Signed)
Gabriel Ramos    [id number]    1950-06-19  Primary Care Physician:Little, Lennette Bihari, MD  Referring Physician: Hulan Fess, MD [place],  [place] [id number] Reason for Consultation: asthma, shortness of breath Date of Consultation: 08/27/2019  Chief complaint:   Chief Complaint  Patient presents with  . Consult    asthma. sob with steps and ambulation. dry cough at night, wakes him up.     HPI:  Here for asthma. Diagnosed in childhood. Has been on prednisone 10 mg daily for the past six months. Has worsening shortness of breath Has been on advair in the past but doesn't remember to take it.  Also has chronic nausea and smokes marijuana daily.   Current Regimen: Uses nebulizer albuterol 2-3 times/week, takes MDI 3-4 times/week. Prednisone 10 mg.  Asthma Triggers: dust, cigarette smoke, mold Exacerbations in the last year: on chronic prednisone 10 mg History of hospitalization or intubation: Yes, had the flu once, never intubated.  Hives/Polyps: yes has had three polypectomies in the 1990s Allergy Testing: yes - in the 1970s. Had SCIT at that time.  GERD: yes, takes Allergic Rhinitis: ACT:  Asthma Control Test ACT Total Score  08/27/2019 11   FeNO: never had  Social history:  Occupation: retired Personal assistant, ran a [employer] Exposures: lives at home with fiance, pets at home: dog, birds(parakeet), turtle Smoking history: never smoker  Social History   Occupational History  . Occupation: retired  Tobacco Use  . Smoking status: Never Smoker  . Smokeless tobacco: Never Used  Substance and Sexual Activity  . Alcohol use: Yes    Alcohol/week: 3.0 standard drinks    Types: 3 Shots of liquor per week    Comment: smirnoffs, BRANDY BOTTLE EVERY OTHER DAY  . Drug use: Yes    Frequency: 7.0 times per week    Types: Marijuana    Comment: this morning 08/27/19  . Sexual activity: Yes    Birth control/protection: None    Relevant family  history:  Family History  Problem Relation Age of Onset  . Heart murmur Mother   . Heart disease Father   . Heart disease Brother   . Asthma Brother   . Cancer Brother        LIVER STAGE 4  . Colon cancer Neg Hx   . Esophageal cancer Neg Hx   . Rectal cancer Neg Hx   . Stomach cancer Neg Hx     Past Medical History:  Diagnosis Date  . Abnormal CXR 09/16/2016   Report 09/07/16 c/w granuloma  . Alcohol dependence with unspecified alcohol-induced disorder ([place])   . Alcoholic hepatitis without ascites   . ALLERGIC RHINITIS 09/14/2006   Qualifier: Diagnosis of  By: [name]    . Anxiety   . Asthma, chronic, severe persistent, uncomplicated 0000000   Allergy profile 09/15/2016 >  Eos 0.5/  IgE  271 Grass > dog  - 09/15/2016    try symbicort 160 2bid  - 10/17/2016  After extensive coaching HFA effectiveness =    75%  (ti too short)  - sinus CT 03/02/2017 >  Pos polyp    . BPH (benign prostatic hyperplasia)   . Concentric left ventricular hypertrophy   . DEPRESSION 09/14/2006   Qualifier: Diagnosis of  By: [name]    . GERD (gastroesophageal reflux disease)   . Gout 03/28/2012  . Hepatitis C   . History of paroxysmal supraventricular tachycardia   .  Hypertension   . Hypoalbuminemia   . Hypokalemia   . Nausea   . Opioid abuse ([place])   . Simple chronic bronchitis ([place])   . SVT (supraventricular tachycardia) ([place])    2013,echo [place] NI LV EF ,mild MR: nl stress cl 3/13  . SVT (supraventricular tachycardia) ([place]) 08/12/2018   Cardiac work-up in 2013 normal  . Vertigo     Past Surgical History:  Procedure Laterality Date  . polyps remvoed from nose    . UPPER GI ENDOSCOPY     approx 10 years     Review of systems: Review of Systems  Constitutional: Negative for chills and fever.  HENT: Positive for congestion, sinus pain and sore throat.   Eyes: Negative for discharge and redness.  Respiratory: Positive for shortness of breath and wheezing. Negative for cough and  hemoptysis.   Cardiovascular: Negative for chest pain and palpitations.  Gastrointestinal: Positive for nausea. Negative for heartburn and vomiting.  Genitourinary: Negative.   Musculoskeletal: Negative for myalgias.  Skin: Positive for rash.  Neurological: Negative for focal weakness.  Endo/Heme/Allergies: Positive for environmental allergies.  Psychiatric/Behavioral: The patient is not nervous/anxious.   All other systems reviewed and are negative.   Physical Exam: Blood pressure 140/85, pulse 99, temperature (!) 97.3 F (36.3 C), temperature source Temporal, height 5\' 9"  (1.753 m), weight 147 lb (66.7 kg), SpO2 99 %. Gen:      No acute distress ENT:  No visible nasal polyps, mucus membranes moist, nasal debris and erythema noted Lungs:    No increased respiratory effort, symmetric chest wall excursion, diminished but clear to auscultation bilaterally, no wheezes or crackles CV:         Regular rate and rhythm; no murmurs, rubs, or gallops.  No pedal edema MSK: no acute synovitis of DIP or PIP joints, no mechanics hands.  Skin:      Bilateral extensor surface eczema with excoriations Neuro: normal speech, no focal facial asymmetry Psych: alert and oriented x3, normal mood and affect   Data Reviewed/Medical Decision Making:  Independent interpretation of tests: Imaging: . Review of patient's CT sinus 2008 images revealed right paranasal polyp. The patient's images have been independently reviewed by me.     PFTs: None on file  Labs:  COVid negative 3/16 IgE 271 April 2018 Allergens to dogs and grasses raised.   Lab Results  Component Value Date   NA 135 10/22/2015   K 2.7 (LL) 10/22/2015   CL 99 (L) 10/22/2015   CO2 27 10/22/2015   Lab Results  Component Value Date   WBC 16.7 (H) 10/17/2016   HGB 13.5 10/17/2016   HCT 39.3 10/17/2016   MCV 97.9 10/17/2016   PLT 314.0 10/17/2016     Immunization status:  Immunization History  Administered Date(s)  Administered  . Influenza Split 08/04/2016  . Td 10/16/2006    . I reviewed prior external note(s) from Dr. Rex Kras . I reviewed the result(s) of the labs and imaging as noted above.  . 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 If not improving may need to consider referral to ENT for polypectomy Resume ICS-LABA today - will start Advair BID with prn albuterol. We need to taper down this prednisone- will go to every other day for the next week then discontinue.  Will obtain spirometry and FeNO  We discussed disease management and progression at length today.  Return to Care: Return in about 2 months (around 10/27/2019).  [name], MD Pulmonary and [place]  CC: Hulan Fess, MD

## 2019-08-27 NOTE — Patient Instructions (Addendum)
The patient should have follow up scheduled with myself in 2 months.   Prior to next visit patient should have: Spirometry/Feno  Start taking prednisone every other day for the next week, then discontinue  Flonase - 1 spray on each side of your nose twice a day for first week, then 1 spray on each side.   Instructions for use:  If you also use a saline nasal spray or rinse, use that first.  Position the head with the chin slightly tucked. Use the right hand to spray into the left nostril and the right hand to spray into the left nostril.   Point the bottle away from the septum of your nose (cartilage that divides the two sides of your nose).   Hold the nostril closed on the opposite side from where you will spray  Spray once and gently sniff to pull the medicine into the higher parts of your nose.  Don't sniff too hard as the medicine will drain down the back of your throat instead.  Repeat with a second spray on the same side if prescribed.  Repeat on the other side of your nose.  Instructions For Advair Diskus Take the ADVAIR DISKUS out of the box and foil overwrap pouch. Write the "Pouch opened" and "Use by" dates on the label on top of the DISKUS. The "Use by" date is 1 month from date of opening the pouch.     * The DISKUS will be in the closed position when the pouch is opened.     * The dose indicator on the top of the DISKUS tells you how many doses are left. The dose indicator number will decrease each time you use the DISKUS. After you have used 55 doses from the DISKUS, the numbers 5 to 0 will appear in red to warn you that there are only a few doses left. If you are using a "sample" DISKUS, the numbers 5 to 0 will appear in red after 23 doses.  Taking a dose from the DISKUS requires the following 3 simple steps: Open, Click, Inhale.  1. OPEN Hold the DISKUS in one hand and put the thumb of your other hand on the thumbgrip. Push your thumb away from you as far as it will go  until the mouthpiece appears and snaps into position.  2.   CLICK Hold the DISKUS in a level, flat position with the mouthpiece toward you. Slide the lever away from you as far as it will go until it clicks. The DISKUS is now ready to use. Every time the lever is pushed back, a dose is ready to be inhaled. This is shown by a decrease in numbers on the dose counter. To avoid releasing or wasting doses once the DISKUS is ready: Do not close the DISKUS. Do not tilt the DISKUS. Do not play with the lever. Do not move the lever more than once.  3.   INHALE Before inhaling your dose from the DISKUS, breathe out (exhale) fully while holding the DISKUS level and away from your mouth. Remember, never breathe out into the DISKUS mouthpiece.  Put the mouthpiece to your lips. Breathe in quickly and deeply through the DISKUS. Do not breathe in through your nose.  Remove the DISKUS from your mouth. Hold your breath for about 10 seconds, or for as long as is comfortable. Breathe out slowly.  The DISKUS delivers your dose of medicine as a very fine powder. Most patients can taste or feel the  powder. Do not use another dose from the DISKUS if you do not feel or taste the medicine.  Rinse your mouth with water after breathing in the medicine. Spit the water out. Do not swallow.  4.  CLOSE the DISKUS when you are finished taking a dose so that the DISKUS will be ready for you to take your next dose. Put your thumb on the thumbgrip and slide the thumbgrip back toward you as far as it will go. The DISKUS will click shut. The lever will automatically return to its original position. The DISKUS is now ready for you to take your next scheduled dose, due in about 12 hours. (Repeat the steps 1 to 4.)  REMEMBER: Never breathe into the DISKUS. Never take the DISKUS apart. Always ready and use the DISKUS in a level, flat position. Do not use the DISKUS with a spacer device. After each dose, rinse your mouth with water  and spit the water out. Do not swallow. Never wash the mouthpiece or any part of the DISKUS. Keep it dry. Always keep the DISKUS in a dry place. Never take an extra dose, even if you did not taste or feel the medicine.

## 2019-09-02 ENCOUNTER — Other Ambulatory Visit (HOSPITAL_COMMUNITY)
Admission: RE | Admit: 2019-09-02 | Discharge: 2019-09-02 | Disposition: A | Discharge status: Home / Self Care | Payer: Medicare HMO | Source: Ambulatory Visit | Attending: Internal Medicine | Admitting: Internal Medicine

## 2019-09-02 DIAGNOSIS — Z20822 Contact with and (suspected) exposure to covid-19: Secondary | ICD-10-CM | POA: Insufficient documentation

## 2019-09-02 DIAGNOSIS — Z01812 Encounter for preprocedural laboratory examination: Secondary | ICD-10-CM | POA: Diagnosis not present

## 2019-09-02 LAB — SARS CORONAVIRUS 2 (TAT 6-24 HRS): SARS Coronavirus 2: NEGATIVE

## 2019-09-05 ENCOUNTER — Ambulatory Visit (INDEPENDENT_AMBULATORY_CARE_PROVIDER_SITE_OTHER): Payer: Medicare HMO

## 2019-09-05 ENCOUNTER — Other Ambulatory Visit: Payer: Self-pay

## 2019-09-05 DIAGNOSIS — J4540 Moderate persistent asthma, uncomplicated: Secondary | ICD-10-CM

## 2019-09-09 ENCOUNTER — Telehealth: Payer: Self-pay | Admitting: Pharmacist

## 2019-09-09 NOTE — Telephone Encounter (Signed)
Patient's wife called and left a VM regarding issues with taking Mavyret.  I called her back this morning. She states that patient is having issues swallowing the Mavyret tablets and getting very nauseated after swallowing them. She has been cutting the three pills in half and having him take them that way. I told her that we have no evidence that Olga could be cut but if that was the only way he could take them, then that will have to do. He is taking all three together. I encouraged her not to cut them more than once. Also encouraged her to try to coat it with honey to make it easier to swallow. Also told her that he could try taking it with applesauce or yogurt.  Regarding the nausea, it seems he isn't taking it with enough food. He has Zofran tablets at home as well. I told her to switch to taking it with his biggest meal of the day (breakfast or dinner) and to premedicate with the Zofran ~30 minutes before taking the Mavyret to hopefully settle his stomach before taking them. I offered to send in more Zofran if needed or switch him to the ODT version for ease of swallowing. She states he has no issues swallowing that pill. They will try this method this week to see how it works. I also informed them that usually after the first couple of weeks the side effects start to dissipate. He is starting week 2. Reminded them that this is only for 2 months and to try and not miss doses or alter how to take them.

## 2019-09-12 ENCOUNTER — Telehealth: Payer: Self-pay | Admitting: Internal Medicine

## 2019-09-12 NOTE — Telephone Encounter (Signed)
ATC Patient.  LM to call back. 

## 2019-09-12 NOTE — Telephone Encounter (Signed)
Yes that is fine. Ok to skip. Can get it another time if it feels necessary.

## 2019-09-12 NOTE — Telephone Encounter (Signed)
Spoke with pt's wife, he was supposed to have a feno and spirometry on the day he was scheduled on 09/05/2019 but the feno wasn't done. I was in the spiro room that day and I didn't know he needed both because the appt notes states Spirometry only. It was only until I read Dr. Shearon Stalls notes that she requested both tests. I called him to see if he could come back but pt was exhausted and stated he just couldn't do it. He struggled with the spirometry test. They would like to know the results of the spirometry test. Please advise.   He could do the feno test before his 5/24 appt with Dr. Shearon Stalls.

## 2019-09-12 NOTE — Telephone Encounter (Signed)
Just to clarify, ok to skip the feno for now?

## 2019-09-12 NOTE — Telephone Encounter (Signed)
His spirometry helps confirm his diagnosis of asthma.  He should continue the current plan of care with the inhalers he resumed on his last appointment with me. We should follow up as scheduled.

## 2019-09-13 NOTE — Telephone Encounter (Signed)
Called and spoke with pt's wife Chong Sicilian letting her know the info stated by Dr. Shearon Stalls that we will skip the feno as spiro confirmed pt's asthma dx. Patty verbalized understanding. Nothing further needed.

## 2019-09-16 DIAGNOSIS — J4550 Severe persistent asthma, uncomplicated: Secondary | ICD-10-CM | POA: Diagnosis not present

## 2019-09-16 DIAGNOSIS — J410 Simple chronic bronchitis: Secondary | ICD-10-CM | POA: Diagnosis not present

## 2019-09-16 DIAGNOSIS — J45909 Unspecified asthma, uncomplicated: Secondary | ICD-10-CM | POA: Diagnosis not present

## 2019-09-16 DIAGNOSIS — F329 Major depressive disorder, single episode, unspecified: Secondary | ICD-10-CM | POA: Diagnosis not present

## 2019-09-16 DIAGNOSIS — N400 Enlarged prostate without lower urinary tract symptoms: Secondary | ICD-10-CM | POA: Diagnosis not present

## 2019-09-16 DIAGNOSIS — I10 Essential (primary) hypertension: Secondary | ICD-10-CM | POA: Diagnosis not present

## 2019-09-17 MED FILL — MAVYRET 100-40 MG TABS: 100-40 | 28 days supply | Qty: 84 | Fill #1

## 2019-10-01 ENCOUNTER — Other Ambulatory Visit: Payer: Self-pay

## 2019-10-01 ENCOUNTER — Ambulatory Visit (INDEPENDENT_AMBULATORY_CARE_PROVIDER_SITE_OTHER): Payer: Medicare HMO | Admitting: Pharmacist

## 2019-10-01 DIAGNOSIS — B182 Chronic viral hepatitis C: Secondary | ICD-10-CM | POA: Diagnosis not present

## 2019-10-01 DIAGNOSIS — Z79899 Other long term (current) drug therapy: Secondary | ICD-10-CM

## 2019-10-01 NOTE — Progress Notes (Signed)
HPI: Gabriel Ramos is a 69 y.o. male who presents to the [facility] for Hepatitis C follow-up.  Medication: Mavyret x8 weeks  Start Date: ~09/02/19  Hepatitis C Genotype: 3  Fibrosis Score: F2  Hepatitis C RNA: 239,000 in 08/2019  Patient Active Problem List   Diagnosis Date Noted  . Chronic hepatitis C without hepatic coma ([place]) 08/07/2019  . Palpitations 08/12/2018  . SVT (supraventricular tachycardia) ([place]) 08/12/2018  . Upper respiratory infection 10/17/2016  . Essential hypertension 09/16/2016  . Abnormal CXR 09/16/2016  . Gout 03/28/2012  . DEPRESSION 09/14/2006  . ALLERGIC RHINITIS 09/14/2006  . Asthma, chronic, severe persistent, uncomplicated [id number]    Patient's Medications  New Prescriptions   No medications on file  Previous Medications   ALBUTEROL (PROAIR HFA) 108 (90 BASE) MCG/ACT INHALER    Inhale 2 puffs into the lungs every 6 (six) hours as needed for wheezing or shortness of breath.   ALBUTEROL (PROVENTIL) (2.5 MG/3ML) 0.083% NEBULIZER SOLUTION    Take 3 mLs (2.5 mg total) by nebulization every 6 (six) hours as needed for wheezing or shortness of breath.   ALLOPURINOL (ZYLOPRIM) 100 MG TABLET       BISOPROLOL (ZEBETA) 5 MG TABLET    Take 1 tablet (5 mg total) by mouth daily.   BUPRENORPHINE HCL-NALOXONE HCL 12-3 MG FILM    in the morning and at bedtime.    FLUOXETINE (PROZAC) 20 MG CAPSULE    Take 20 mg by mouth daily.   FLUTICASONE (FLONASE) 50 MCG/ACT NASAL SPRAY    Place 1 spray into both nostrils in the morning and at bedtime.   FLUTICASONE-SALMETEROL (ADVAIR DISKUS) 100-50 MCG/DOSE AEPB    Inhale 1 puff into the lungs in the morning and at bedtime.   GLECAPREVIR-PIBRENTASVIR (MAVYRET) 100-40 MG TABS    Take 3 tablets by mouth daily with breakfast.   HYDROXYZINE (VISTARIL) 25 MG CAPSULE    at bedtime.    LOSARTAN POTASSIUM PO    Take 25 mg by mouth daily.   MONTELUKAST (SINGULAIR) 10 MG TABLET    10 mg daily.    NEBULIZERS (NEBULIZER  COMPRESSOR) MISC    Use as directed Dx:   ONDANSETRON (ZOFRAN) 4 MG TABLET    Take 1 tablet (4 mg total) by mouth every 6 (six) hours.   PANTOPRAZOLE (PROTONIX) 40 MG TABLET    TAKE 1 TABLET (40 MG TOTAL) BY MOUTH DAILY. TAKE 30-60 MIN BEFORE FIRST MEAL OF THE DAY   POTASSIUM CHLORIDE (K-DUR) 10 MEQ TABLET    Take 1 tablet (10 mEq total) by mouth daily.   PRAZOSIN (MINIPRESS) 1 MG CAPSULE       PREDNISONE (DELTASONE) 10 MG TABLET    Take   2 each am until better then 1 daily until return   TAMSULOSIN (FLOMAX) 0.4 MG CAPS CAPSULE    Take 0.4 mg by mouth daily.   VERAPAMIL (CALAN-SR) 180 MG CR TABLET    Take 360 mg by mouth daily.  Modified Medications   No medications on file  Discontinued Medications   No medications on file    Allergies: Allergies  Allergen Reactions  . Aspirin Shortness Of Breath and Swelling    REACTION: ASTHMA  . Penicillins Swelling    Makes lips and tongue swell up Has patient had a PCN reaction causing immediate rash, facial/tongue/throat swelling, SOB or lightheadedness with hypotension: yes Has patient had a PCN reaction causing severe rash involving mucus membranes or skin necrosis: no Has  patient had a PCN reaction that required hospitalization: no Has patient had a PCN reaction occurring within the last 10 years: no, a little over 10 years If all of the above answers are "NO", then may proceed with Cephalosporin use.     Past Medical History: Past Medical History:  Diagnosis Date  . Abnormal CXR 09/16/2016   Report 09/07/16 c/w granuloma  . Alcohol dependence with unspecified alcohol-induced disorder ([place])   . Alcoholic hepatitis without ascites   . ALLERGIC RHINITIS 09/14/2006   Qualifier: Diagnosis of  By: [name]    . Anxiety   . Asthma, chronic, severe persistent, uncomplicated 0000000   Allergy profile 09/15/2016 >  Eos 0.5/  IgE  271 Grass > dog  - 09/15/2016    try symbicort 160 2bid  - 10/17/2016  After extensive coaching HFA  effectiveness =    75%  (ti too short)  - sinus CT 03/02/2017 >  Pos polyp    . BPH (benign prostatic hyperplasia)   . Concentric left ventricular hypertrophy   . DEPRESSION 09/14/2006   Qualifier: Diagnosis of  By: [name]    . GERD (gastroesophageal reflux disease)   . Gout 03/28/2012  . Hepatitis C   . History of paroxysmal supraventricular tachycardia   . Hypertension   . Hypoalbuminemia   . Hypokalemia   . Nausea   . Opioid abuse ([place])   . Simple chronic bronchitis ([place])   . SVT (supraventricular tachycardia) ([place])    2013,echo Varanasi NI LV EF ,mild MR: nl stress cl 3/13  . SVT (supraventricular tachycardia) ([place]) 08/12/2018   Cardiac work-up in 2013 normal  . Vertigo     Social History: Social History   Socioeconomic History  . Marital status: Significant Other    Spouse name: Not on file  . Number of children: 1  . Years of education: Not on file  . Highest education level: Not on file  Occupational History  . Occupation: retired  Tobacco Use  . Smoking status: Never Smoker  . Smokeless tobacco: Never Used  Substance and Sexual Activity  . Alcohol use: Yes    Alcohol/week: 3.0 standard drinks    Types: 3 Shots of liquor per week    Comment: smirnoffs, BRANDY BOTTLE EVERY OTHER DAY  . Drug use: Yes    Frequency: 7.0 times per week    Types: Marijuana    Comment: this morning 08/27/19  . Sexual activity: Yes    Birth control/protection: None  Other Topics Concern  . Not on file  Social History Narrative  . Not on file   Social Determinants of Health   Financial Resource Strain:   . Difficulty of Paying Living Expenses:   Food Insecurity:   . Worried About Charity fundraiser in the Last Year:   . Arboriculturist in the Last Year:   Transportation Needs:   . Film/video editor (Medical):   Marland Kitchen Lack of Transportation (Non-Medical):   Physical Activity:   . Days of Exercise per Week:   . Minutes of Exercise per Session:   Stress:   . Feeling of  Stress :   Social Connections:   . Frequency of Communication with Friends and Family:   . Frequency of Social Gatherings with Friends and Family:   . Attends Religious Services:   . Active Member of Clubs or Organizations:   . Attends Archivist Meetings:   Marland Kitchen Marital Status:     Labs:  Hepatitis C Lab Results  Component Value Date   HCVGENOTYPE 3 08/06/2019   FIBROSTAGE F2 08/06/2019   Hepatitis B Lab Results  Component Value Date   HEPBSAB NON-REACTIVE 08/06/2019   HEPBSAG NON-REACTIVE 08/06/2019   Hepatitis A No results found for: HAV HIV No results found for: HIV Lab Results  Component Value Date   CREATININE 0.60 (L) 10/22/2015   CREATININE 0.9 10/24/2006   Lab Results  Component Value Date   AST 52 (H) 08/06/2019   AST 33 10/22/2015   ALT 34 08/06/2019   ALT 34 08/06/2019   ALT 17 10/22/2015   INR 1.0 08/06/2019    Assessment: Gabriel Ramos is here for 4 week follow-up for treatment of Hepatitis C with Mavyret x8 weeks. Since his wife spoke with Cassie on 4/5, he reports doing better with taking the medication and that it has gotten easier to take. He is able to swallow each tablet whole one after another, so they are not cutting them in half anymore. He reports not missing any doses, and we congratulated him on doing so well despite feeling poorly and encouraged him that he only has a few weeks left of treatment. He takes all 3 tablets around 2pm with some food. He reports that it does cause some nausea and fatigue. He has also had ongoing nausea for at least ~1 yr, which he experiences off and on throughout the day every day, sometimes with vomiting. He takes Zofran usually once a day in the morning and occasionally at night as well, and he reports that he does not take it more often because it makes him sleepy. He was recently prescribed pantoprazole for acid reflux, which he has not started yet; we confirmed that this is okay to take with Gabriel Ramos. He  received his 2nd month's supply of Mavyret with no issues. We will check viral load today and see him back in 1 month at the EOT with Mavyret.   Plan: - Continue Mavyret x8 weeks - Follow up with Cassie in 1 month on 5/27 at 2:30pm   Vallery Sa PharmD Candidate 10/01/2019, 2:53 PM

## 2019-10-04 LAB — HEPATITIS C RNA QUANTITATIVE
HCV Quantitative Log: <1.18 Log IU/mL — AB
HCV RNA, PCR, QN: <15 IU/mL — AB

## 2019-10-04 LAB — COMPREHENSIVE METABOLIC PANEL
AG Ratio: 1.6 (calc) (ref 1.0–2.5)
ALT: 36 U/L (ref 9–46)
AST: 54 U/L — ABNORMAL HIGH (ref 10–35)
Albumin: 2.8 g/dL — ABNORMAL LOW (ref 3.6–5.1)
Alkaline phosphatase (APISO): 91 U/L (ref 35–144)
BUN/Creatinine Ratio: 8 (calc) (ref 6–22)
BUN: 5 mg/dL — ABNORMAL LOW (ref 7–25)
CO2: 33 mmol/L — ABNORMAL HIGH (ref 20–32)
Calcium: 7.6 mg/dL — ABNORMAL LOW (ref 8.6–10.3)
Chloride: 100 mmol/L (ref 98–110)
Creat: 0.65 mg/dL — ABNORMAL LOW (ref 0.70–1.25)
Globulin: 1.8 g/dL (calc) — ABNORMAL LOW (ref 1.9–3.7)
Glucose, Bld: 74 mg/dL (ref 65–99)
Potassium: 2.9 mmol/L — ABNORMAL LOW (ref 3.5–5.3)
Sodium: 143 mmol/L (ref 135–146)
Total Bilirubin: 3.4 mg/dL — ABNORMAL HIGH (ref 0.2–1.2)
Total Protein: 4.6 g/dL — ABNORMAL LOW (ref 6.1–8.1)

## 2019-10-11 ENCOUNTER — Telehealth: Payer: Self-pay | Admitting: *Deleted

## 2019-10-11 DIAGNOSIS — R112 Nausea with vomiting, unspecified: Secondary | ICD-10-CM

## 2019-10-11 NOTE — Telephone Encounter (Signed)
I will call but he has had nausea for years and it is not just because of the medication. Ive spoken with them about this about 3-4 times.

## 2019-10-11 NOTE — Telephone Encounter (Signed)
Patient and his wife called with questions. His nausea has improved, but now he is having nausea first thing in the morning. They are worried that this is because of the medication.  They would like to speak with Cassie about this nausea. He is also having swelling in his legs/feet, will call his primary care physician for an appointment.  Gabriel Gandy, RN

## 2019-10-16 MED ORDER — PROMETHAZINE HCL 12.5 MG PO TABS: 12.5000 mg | ORAL_TABLET | Freq: Four times a day (QID) | ORAL | 2 refills | Status: DC | PRN

## 2019-10-16 NOTE — Telephone Encounter (Signed)
Spoke to patient's wife regarding his nausea.  He has suffered from this for years but it is worse on the [place].  He has one week left and missed yesterday's dose as he was so nauseous and then tried to take the [place] but vomited unfortunately. They are very worried that he missed a dose.  His labs a few weeks ago showed undetectable virus, which is great.  I told them that 100% adherence is ideal but missing one dose would hopefully not cause a problem.  They will do their best to have him not miss anymore.    I told him to take his Protonix as indigestion/severe acid reflux could cause some nausea too, and she states he gets it when he lays down a lot.  He is going to his PCP tomorrow to hopefully get some answers.  I will send in Phenergan to his pharmacy to take for his nausea. Told them to call me anytime.

## 2019-10-16 NOTE — Addendum Note (Signed)
Addended by: Darletta Moll on: 10/16/2019 10:51 AM   Modules accepted: Orders

## 2019-10-16 NOTE — Telephone Encounter (Signed)
Patient's wife called office today stating patient is unable to tolerate Mavyret. Zofran is not helping with nausea. States patient has missed one dose. Transferred call to pharmacy team to discuss treatment. [place]

## 2019-10-17 ENCOUNTER — Encounter: Payer: Self-pay | Admitting: Gastroenterology

## 2019-10-17 DIAGNOSIS — E876 Hypokalemia: Secondary | ICD-10-CM | POA: Diagnosis not present

## 2019-10-17 DIAGNOSIS — R609 Edema, unspecified: Secondary | ICD-10-CM | POA: Diagnosis not present

## 2019-10-17 DIAGNOSIS — I10 Essential (primary) hypertension: Secondary | ICD-10-CM | POA: Diagnosis not present

## 2019-10-17 DIAGNOSIS — F1020 Alcohol dependence, uncomplicated: Secondary | ICD-10-CM | POA: Diagnosis not present

## 2019-10-23 DIAGNOSIS — J410 Simple chronic bronchitis: Secondary | ICD-10-CM | POA: Diagnosis not present

## 2019-10-23 DIAGNOSIS — N400 Enlarged prostate without lower urinary tract symptoms: Secondary | ICD-10-CM | POA: Diagnosis not present

## 2019-10-23 DIAGNOSIS — J4550 Severe persistent asthma, uncomplicated: Secondary | ICD-10-CM | POA: Diagnosis not present

## 2019-10-23 DIAGNOSIS — J45909 Unspecified asthma, uncomplicated: Secondary | ICD-10-CM | POA: Diagnosis not present

## 2019-10-23 DIAGNOSIS — I10 Essential (primary) hypertension: Secondary | ICD-10-CM | POA: Diagnosis not present

## 2019-10-23 DIAGNOSIS — J45901 Unspecified asthma with (acute) exacerbation: Secondary | ICD-10-CM | POA: Diagnosis not present

## 2019-10-23 DIAGNOSIS — F329 Major depressive disorder, single episode, unspecified: Secondary | ICD-10-CM | POA: Diagnosis not present

## 2019-10-28 ENCOUNTER — Other Ambulatory Visit: Payer: Self-pay

## 2019-10-28 ENCOUNTER — Ambulatory Visit: Payer: Medicare HMO | Admitting: Internal Medicine

## 2019-10-28 ENCOUNTER — Encounter: Payer: Self-pay | Admitting: Internal Medicine

## 2019-10-28 VITALS — BP 96/70 | HR 109 | Temp 98.2°F | Ht 69.0 in | Wt 149.0 lb

## 2019-10-28 DIAGNOSIS — J301 Allergic rhinitis due to pollen: Secondary | ICD-10-CM

## 2019-10-28 DIAGNOSIS — J4540 Moderate persistent asthma, uncomplicated: Secondary | ICD-10-CM | POA: Diagnosis not present

## 2019-10-28 MED ORDER — FLOVENT HFA 110 MCG/ACT IN AERO: 1.0000 | INHALATION_SPRAY | Freq: Two times a day (BID) | RESPIRATORY_TRACT | 12 refills | Status: DC

## 2019-10-28 MED ORDER — AEROCHAMBER MV MISC: 0 refills | Status: AC

## 2019-10-28 MED ORDER — FLUTICASONE PROPIONATE 50 MCG/ACT NA SUSP: 1.0000 | Freq: Every day | NASAL | 11 refills | Status: DC

## 2019-10-28 NOTE — Progress Notes (Signed)
Gabriel Ramos    [id number]    June 23, 1950  Primary Care Physician:Little, Lennette Bihari, MD Date of Appointment: 10/28/2019 Established Patient Visit  Chief complaint:   Chief Complaint  Patient presents with  . Follow-up    breathing a little better.  sob with steps.  dry cough infrequently.     HPI: Gabriel Ramos is a 69 y.o. with asthma who presents for follow up.   Interval Updates:  Stopped advair because of sore throat. Doesn't do well with DPI. Was gargling after use with mouthwash and water. No hospitalizations or ED visits. Has a sensitive gag reflex so has trouble with gargling. He is off the prednisone.   Also undergoing treatment for chronic Hep C with anti-virals  Current Regimen: uses albuterol MDI only 1-2 times/day.  Asthma Triggers: dust, cigarette smoke, mold Exacerbations in the last year: on chronic prednisone 10 mg History of hospitalization or intubation: Yes, had the flu once, never intubated.  Hives/Polyps: yes has had three polypectomies in the 1990s Allergy Testing: yes - in the 1970s. Had SCIT at that time.  GERD: yes, takes Allergic Rhinitis: ACT:  Asthma Control Test ACT Total Score  10/28/2019 12  08/27/2019 11    I have reviewed the patient's family social and past medical history and updated as appropriate.   Past Medical History:  Diagnosis Date  . Abnormal CXR 09/16/2016   Report 09/07/16 c/w granuloma  . Alcohol dependence with unspecified alcohol-induced disorder ([place])   . Alcoholic hepatitis without ascites   . ALLERGIC RHINITIS 09/14/2006   Qualifier: Diagnosis of  By: [name]    . Anxiety   . Asthma, chronic, severe persistent, uncomplicated 0000000   Allergy profile 09/15/2016 >  Eos 0.5/  IgE  271 Grass > dog  - 09/15/2016    try symbicort 160 2bid  - 10/17/2016  After extensive coaching HFA effectiveness =    75%  (ti too short)  - sinus CT 03/02/2017 >  Pos polyp    . BPH (benign prostatic hyperplasia)   .  Concentric left ventricular hypertrophy   . DEPRESSION 09/14/2006   Qualifier: Diagnosis of  By: [name]    . GERD (gastroesophageal reflux disease)   . Gout 03/28/2012  . Hepatitis C   . History of paroxysmal supraventricular tachycardia   . Hypertension   . Hypoalbuminemia   . Hypokalemia   . Nausea   . Opioid abuse ([place])   . Simple chronic bronchitis ([place])   . SVT (supraventricular tachycardia) ([place])    2013,echo Varanasi NI LV EF ,mild MR: nl stress cl 3/13  . SVT (supraventricular tachycardia) ([place]) 08/12/2018   Cardiac work-up in 2013 normal  . Vertigo     Past Surgical History:  Procedure Laterality Date  . polyps remvoed from nose    . UPPER GI ENDOSCOPY     approx 10 years    Family History  Problem Relation Age of Onset  . Heart murmur Mother   . Heart disease Father   . Heart disease Brother   . Asthma Brother   . Cancer Brother        LIVER STAGE 4  . Colon cancer Neg Hx   . Esophageal cancer Neg Hx   . Rectal cancer Neg Hx   . Stomach cancer Neg Hx     Social History   Occupational History  . Occupation: retired  Tobacco Use  . Smoking status: Never Smoker  .  Smokeless tobacco: Never Used  Substance and Sexual Activity  . Alcohol use: Yes    Alcohol/week: 3.0 standard drinks    Types: 3 Shots of liquor per week    Comment: smirnoffs, BRANDY BOTTLE EVERY OTHER DAY  . Drug use: Yes    Frequency: 7.0 times per week    Types: Marijuana    Comment: this morning 08/27/19  . Sexual activity: Yes    Birth control/protection: None     Physical Exam: Blood pressure 96/70, pulse (!) 109, temperature 98.2 F (36.8 C), temperature source Temporal, height 5\' 9"  (1.753 m), weight 149 lb (67.6 kg), SpO2 95 %.  Gen:      No acute distress, fatigued and chronically ill appearing Lungs:    No increased respiratory effort, symmetric chest wall excursion, clear to auscultation bilaterally, no wheezes or crackles CV:         Tachycardic, regular, no  murmurs, rubs, or gallops.  No pedal edema  Data Reviewed: Imaging: I have personally reviewed the chest xray May 2018 hyperinflation  PFTs: Patient was unable to complete PFTs. In April 2020. Best attempt suggested moderate airflow limitation.  Labs: Lab Results  Component Value Date   WBC 16.7 (H) 10/17/2016   HGB 13.5 10/17/2016   HCT 39.3 10/17/2016   MCV 97.9 10/17/2016   PLT 314.0 10/17/2016   Lab Results  Component Value Date   NA 143 10/01/2019   K 2.9 (L) 10/01/2019   CL 100 10/01/2019   CO2 33 (H) 10/01/2019     Immunization status: Immunization History  Administered Date(s) Administered  . Influenza Split 08/04/2016  . Influenza, High Dose Seasonal PF 04/30/2019  . Td 10/16/2006    Assessment:  Moderate Persistent asthma, not well controlled Allergic rhinitis, not well controlled  Plan/Recommendations: Didn't not tolerate advair. Switch to flovent HFA with spacer. He prefers MDI to DPI due to throat irritation and difficulty gargling due to gag reflex.  Start fluticasone intra nasal to help with rhinitis. He didn't want to try any more pills at this time so will defer anti- histamine or montelukast.   He will let us know if any difficulties with flovent prior to follow up visit.   Return to Care: Return in about 4 months (around 02/28/2020).   [name], MD Pulmonary and [place]

## 2019-10-28 NOTE — Patient Instructions (Signed)
The patient should have follow up scheduled with myself in 4 months.   Start taking flovent 1 puff twice a day. Gargle after use. Can use with spacer.  Can continue the albuterol as needed.   Flonase - 1 spray on each side of your nose twice a day for first week, then 1 spray on each side.   Instructions for use:  If you also use a saline nasal spray or rinse, use that first.  Position the head with the chin slightly tucked. Use the right hand to spray into the left nostril and the right hand to spray into the left nostril.   Point the bottle away from the septum of your nose (cartilage that divides the two sides of your nose).   Hold the nostril closed on the opposite side from where you will spray  Spray once and gently sniff to pull the medicine into the higher parts of your nose.  Don't sniff too hard as the medicine will drain down the back of your throat instead.  Repeat with a second spray on the same side if prescribed.  Repeat on the other side of your nose. ------------------------------------------------------------------------------------------------------------------- Metered Dose Inhaler (MDI) Instructions (Albuterol, Flovent)  Before using your inhaler for the first time: 1. Take the cap off the mouthpiece. 2. Shake the inhaler for 5 seconds 3. Press down on the canister to spray the medicine into the air. 4. Repeat these steps 3 more times. If you haven't used your inhaler in more than 2 weeks, repeat these steps before using it.  To use your inhaler: 1. Take the cap off the mouthpiece 2. Shake the inhaler for 5 seconds. 3. Hold it upright with your finger on the top of the canister and your thumb on the bottom of the inhaler. 4. Breathe out. 5. Close your lips around the mouthpiece. 6. As you start to inhale the next breath, press down on the canister. 7. Inhale deeply and slowly through your mouth. 8. Hold your breath for 5 to 10 seconds to keep the medicine in  your lungs. 9. Let your breath out.   10. Repeat these steps if you are supposed to take 2 puffs.  11. Put the cap back on the mouthpiece 12. Remember to rinse, gargle and spit with water after use if your inhaler has a steroid in it (Advair, Symbicort, Dulera, Qvar, Flovent)  Caring for your MDI and chamber For most MDIs, remove the canister and rinse the plastic holder with warm running water once a week to prevent the holes from getting clogged. Shake well and let air dry. There are some medications in which the inhaler cannot be removed from the holder. These usually need to be cleaned by wiping the mouthpiece with a cloth or cleaning with a dry cotton swab. Refer to the patient instructions that come with your inhaler. Clean the chamber about once a week. Remove the soft ring at the end of the chamber. Soak the spacer in warm water with a mild detergent. Carefully clean and, rinse, and shake off excess water. Do not hand dry. Allow to completely air dry. Do not store the chamber in a plastic bag.  Checking your MDI It is important that you know how much medication is left in your inhaler. The number of puffs contained in your MDI is printed on the side of the canister. After you have used that number of puffs, you must discard your inhaler even if it continues to spray. Keep track  of how many puffs you have used. You also must include priming puffs in this total. If you use an MDI every day for control of symptoms, you can determine how long it will last by dividing the total number of puffs in the MDI by the total puffs you use every day. For example: 2 puffs x 2 times per day = 4 total puffs per day. At 120 puffs, the MDI will last 30 days. If you use an inhaler only when you need to, you must keep track of how many times you spray the inhaler. Some of the newer MDIs have counting devices built in.  If your MDI does not have a dose counter, you can obtain a device that attaches to the MDI and  counts down the number of puffs each time you press the inhaler. Ask your health care professional for more information about these devices, as well as how to best keep track of your medicine without an add-on device (if you prefer).

## 2019-10-31 ENCOUNTER — Ambulatory Visit (INDEPENDENT_AMBULATORY_CARE_PROVIDER_SITE_OTHER): Payer: Medicare HMO | Admitting: Pharmacist

## 2019-10-31 ENCOUNTER — Telehealth: Payer: Self-pay | Admitting: Pharmacy Technician

## 2019-10-31 ENCOUNTER — Other Ambulatory Visit: Payer: Self-pay

## 2019-10-31 DIAGNOSIS — B182 Chronic viral hepatitis C: Secondary | ICD-10-CM

## 2019-10-31 DIAGNOSIS — Z79899 Other long term (current) drug therapy: Secondary | ICD-10-CM | POA: Diagnosis not present

## 2019-10-31 NOTE — Progress Notes (Signed)
HPI: Gabriel Ramos is a 69 y.o. male who presents to the [facility] for Hepatitis C follow-up.  Medication: Mavyret x 8 weeks  Start Date: ~09/02/19  Hepatitis C Genotype: 3  Fibrosis Score: F2  Hepatitis C RNA: 239,000 in 08/2019; Undetectable in 09/2019  Patient Active Problem List   Diagnosis Date Noted  . Chronic hepatitis C without hepatic coma ([place]) 08/07/2019  . Palpitations 08/12/2018  . SVT (supraventricular tachycardia) ([place]) 08/12/2018  . Upper respiratory infection 10/17/2016  . Essential hypertension 09/16/2016  . Abnormal CXR 09/16/2016  . Gout 03/28/2012  . DEPRESSION 09/14/2006  . ALLERGIC RHINITIS 09/14/2006  . Asthma, chronic, severe persistent, uncomplicated [id number]    Patient's Medications  New Prescriptions   No medications on file  Previous Medications   ALBUTEROL (PROAIR HFA) 108 (90 BASE) MCG/ACT INHALER    Inhale 2 puffs into the lungs every 6 (six) hours as needed for wheezing or shortness of breath.   ALBUTEROL (PROVENTIL) (2.5 MG/3ML) 0.083% NEBULIZER SOLUTION    Take 3 mLs (2.5 mg total) by nebulization every 6 (six) hours as needed for wheezing or shortness of breath.   ALLOPURINOL (ZYLOPRIM) 100 MG TABLET       BISOPROLOL (ZEBETA) 5 MG TABLET    Take 1 tablet (5 mg total) by mouth daily.   BUPRENORPHINE HCL-NALOXONE HCL 12-3 MG FILM    in the morning and at bedtime.    FLUOXETINE (PROZAC) 20 MG CAPSULE    Take 20 mg by mouth daily.   FLUTICASONE (FLONASE) 50 MCG/ACT NASAL SPRAY    Place 1 spray into both nostrils in the morning and at bedtime.   FLUTICASONE (FLONASE) 50 MCG/ACT NASAL SPRAY    Place 1 spray into both nostrils daily.   FLUTICASONE (FLOVENT HFA) 110 MCG/ACT INHALER    Inhale 1 puff into the lungs in the morning and at bedtime.   FLUTICASONE-SALMETEROL (ADVAIR DISKUS) 100-50 MCG/DOSE AEPB    Inhale 1 puff into the lungs in the morning and at bedtime.   GLECAPREVIR-PIBRENTASVIR (MAVYRET) 100-40 MG TABS    Take 3  tablets by mouth daily with breakfast.   HYDROXYZINE (VISTARIL) 25 MG CAPSULE    at bedtime.    LOSARTAN POTASSIUM PO    Take 25 mg by mouth daily.   MONTELUKAST (SINGULAIR) 10 MG TABLET    10 mg daily.    NEBULIZERS (NEBULIZER COMPRESSOR) MISC    Use as directed Dx:   ONDANSETRON (ZOFRAN) 4 MG TABLET    Take 1 tablet (4 mg total) by mouth every 6 (six) hours.   PANTOPRAZOLE (PROTONIX) 40 MG TABLET    TAKE 1 TABLET (40 MG TOTAL) BY MOUTH DAILY. TAKE 30-60 MIN BEFORE FIRST MEAL OF THE DAY   POTASSIUM CHLORIDE (K-DUR) 10 MEQ TABLET    Take 1 tablet (10 mEq total) by mouth daily.   PRAZOSIN (MINIPRESS) 1 MG CAPSULE       PROMETHAZINE (PHENERGAN) 12.5 MG TABLET    Take 1 tablet (12.5 mg total) by mouth every 6 (six) hours as needed for nausea or vomiting.   SPACER/AERO-HOLDING CHAMBERS (AEROCHAMBER MV) INHALER    Use as instructed   SPIRONOLACTONE (ALDACTONE) 100 MG TABLET       TAMSULOSIN (FLOMAX) 0.4 MG CAPS CAPSULE    Take 0.4 mg by mouth daily.   VERAPAMIL (CALAN-SR) 180 MG CR TABLET    Take 360 mg by mouth daily.  Modified Medications   No medications on file  Discontinued Medications  No medications on file    Allergies: Allergies  Allergen Reactions  . Aspirin Shortness Of Breath and Swelling    REACTION: ASTHMA  . Penicillins Swelling    Makes lips and tongue swell up Has patient had a PCN reaction causing immediate rash, facial/tongue/throat swelling, SOB or lightheadedness with hypotension: yes Has patient had a PCN reaction causing severe rash involving mucus membranes or skin necrosis: no Has patient had a PCN reaction that required hospitalization: no Has patient had a PCN reaction occurring within the last 10 years: no, a little over 10 years If all of the above answers are "NO", then may proceed with Cephalosporin use.     Past Medical History: Past Medical History:  Diagnosis Date  . Abnormal CXR 09/16/2016   Report 09/07/16 c/w granuloma  . Alcohol dependence with  unspecified alcohol-induced disorder ([place])   . Alcoholic hepatitis without ascites   . ALLERGIC RHINITIS 09/14/2006   Qualifier: Diagnosis of  By: [name]    . Anxiety   . Asthma, chronic, severe persistent, uncomplicated 0000000   Allergy profile 09/15/2016 >  Eos 0.5/  IgE  271 Grass > dog  - 09/15/2016    try symbicort 160 2bid  - 10/17/2016  After extensive coaching HFA effectiveness =    75%  (ti too short)  - sinus CT 03/02/2017 >  Pos polyp    . BPH (benign prostatic hyperplasia)   . Concentric left ventricular hypertrophy   . DEPRESSION 09/14/2006   Qualifier: Diagnosis of  By: [name]    . GERD (gastroesophageal reflux disease)   . Gout 03/28/2012  . Hepatitis C   . History of paroxysmal supraventricular tachycardia   . Hypertension   . Hypoalbuminemia   . Hypokalemia   . Nausea   . Opioid abuse ([place])   . Simple chronic bronchitis ([place])   . SVT (supraventricular tachycardia) ([place])    2013,echo [place] NI LV EF ,mild MR: nl stress cl 3/13  . SVT (supraventricular tachycardia) ([place]) 08/12/2018   Cardiac work-up in 2013 normal  . Vertigo     Social History: Social History   Socioeconomic History  . Marital status: Significant Other    Spouse name: Not on file  . Number of children: 1  . Years of education: Not on file  . Highest education level: Not on file  Occupational History  . Occupation: retired  Tobacco Use  . Smoking status: Never Smoker  . Smokeless tobacco: Never Used  Substance and Sexual Activity  . Alcohol use: Yes    Alcohol/week: 3.0 standard drinks    Types: 3 Shots of liquor per week    Comment: smirnoffs, BRANDY BOTTLE EVERY OTHER DAY  . Drug use: Yes    Frequency: 7.0 times per week    Types: Marijuana    Comment: this morning 08/27/19  . Sexual activity: Yes    Birth control/protection: None  Other Topics Concern  . Not on file  Social History Narrative  . Not on file   Social Determinants of Health   Financial Resource  Strain:   . Difficulty of Paying Living Expenses:   Food Insecurity:   . Worried About Charity fundraiser in the Last Year:   . Arboriculturist in the Last Year:   Transportation Needs:   . Film/video editor (Medical):   Marland Kitchen Lack of Transportation (Non-Medical):   Physical Activity:   . Days of Exercise per Week:   . Minutes  of Exercise per Session:   Stress:   . Feeling of Stress :   Social Connections:   . Frequency of Communication with Friends and Family:   . Frequency of Social Gatherings with Friends and Family:   . Attends Religious Services:   . Active Member of Clubs or Organizations:   . Attends Archivist Meetings:   Marland Kitchen Marital Status:     Labs: Hepatitis C Lab Results  Component Value Date   HCVGENOTYPE 3 08/06/2019   HCVRNAPCRQN <15 DETECTED (A) 10/01/2019   FIBROSTAGE F2 08/06/2019   Hepatitis B Lab Results  Component Value Date   HEPBSAB NON-REACTIVE 08/06/2019   HEPBSAG NON-REACTIVE 08/06/2019   Hepatitis A No results found for: HAV HIV No results found for: HIV Lab Results  Component Value Date   CREATININE 0.65 (L) 10/01/2019   CREATININE 0.60 (L) 10/22/2015   CREATININE 0.9 10/24/2006   Lab Results  Component Value Date   AST 54 (H) 10/01/2019   AST 52 (H) 08/06/2019   AST 33 10/22/2015   ALT 36 10/01/2019   ALT 34 08/06/2019   ALT 34 08/06/2019   INR 1.0 08/06/2019    Assessment: Ahmare presents today for his end of treatment follow-visit after completing Mavyret x 8 weeks for Hepatitis C management. He was adherent to Mavyret 3 tablets daily and missed 1 dose during his treatment period due to nausea/vomitting episodes. He reports that his last dose was "sometime last week." While on Mavyret he experienced worsened nausea/vomitting and was prescribed Zofran and Phenergan. However, now his nausea/vomitting is better since finishing Mavyret and denied any other side effects. Current viral load as of 09/2019 is undetectable and  LFTs were slightly elevated with an AST of 54 and T.bili of 3.4. Potassium was 2.9 and is on potassium supplements now.   He also brought with him an updated medication list given to him by Dr. Rex Kras his PCP. Therefore, I conducted a medication reconciliation and discontinued Fluticasone and Prazosin and adjusted his potassium supplements to 20 mEq daily to reflect his updated medication list. Discussedthe importance oflimitingrisky behavior ashecan still be reinfected with HepC even thoughhehas completed treatment. Also explained that he willalwayshave positive Hepatitis C antibodies and should notify future healthcare providers that he has completed treatment. Patient verbalized understanding. Will check HCVviral loadand CMET today.  Plan: - Labs today: Hepatitis C RNA VL and CMET - Removed Fluticasone and Prazosin and adjusted Potassium supplements to 20 mEq on medication list - F/u with Cassie in 12 weeks for SVR visit on 8/23 at 2:30PM  Lorel Monaco, PharmD PGY1 [facility] 10/31/2019, 9:36 AM

## 2019-10-31 NOTE — Telephone Encounter (Signed)
[facility] Patient Advocate Encounter  Patient's fill history for Mavyret from [facility]:  08/26/2019 09/17/2019  Both medication refills were covered at $0.00 with insurance and grant coverage.

## 2019-11-01 DIAGNOSIS — F1020 Alcohol dependence, uncomplicated: Secondary | ICD-10-CM | POA: Diagnosis not present

## 2019-11-01 DIAGNOSIS — E876 Hypokalemia: Secondary | ICD-10-CM | POA: Diagnosis not present

## 2019-11-01 DIAGNOSIS — I10 Essential (primary) hypertension: Secondary | ICD-10-CM | POA: Diagnosis not present

## 2019-11-01 DIAGNOSIS — R609 Edema, unspecified: Secondary | ICD-10-CM | POA: Diagnosis not present

## 2019-11-02 LAB — COMPREHENSIVE METABOLIC PANEL
AG Ratio: 1.2 (calc) (ref 1.0–2.5)
ALT: 21 U/L (ref 9–46)
AST: 36 U/L — ABNORMAL HIGH (ref 10–35)
Albumin: 2.5 g/dL — ABNORMAL LOW (ref 3.6–5.1)
Alkaline phosphatase (APISO): 92 U/L (ref 35–144)
BUN/Creatinine Ratio: 5 (calc) — ABNORMAL LOW (ref 6–22)
BUN: 4 mg/dL — ABNORMAL LOW (ref 7–25)
CO2: 28 mmol/L (ref 20–32)
Calcium: 8.2 mg/dL — ABNORMAL LOW (ref 8.6–10.3)
Chloride: 104 mmol/L (ref 98–110)
Creat: 0.73 mg/dL (ref 0.70–1.25)
Globulin: 2.1 g/dL (calc) (ref 1.9–3.7)
Glucose, Bld: 84 mg/dL (ref 65–99)
Potassium: 5 mmol/L (ref 3.5–5.3)
Sodium: 138 mmol/L (ref 135–146)
Total Bilirubin: 2.1 mg/dL — ABNORMAL HIGH (ref 0.2–1.2)
Total Protein: 4.6 g/dL — ABNORMAL LOW (ref 6.1–8.1)

## 2019-11-02 LAB — HEPATITIS C RNA QUANTITATIVE
HCV Quantitative Log: <1.18 Log IU/mL — AB
HCV RNA, PCR, QN: <15 IU/mL — AB

## 2019-11-25 DIAGNOSIS — J45901 Unspecified asthma with (acute) exacerbation: Secondary | ICD-10-CM | POA: Diagnosis not present

## 2019-11-25 DIAGNOSIS — J410 Simple chronic bronchitis: Secondary | ICD-10-CM | POA: Diagnosis not present

## 2019-11-25 DIAGNOSIS — I10 Essential (primary) hypertension: Secondary | ICD-10-CM | POA: Diagnosis not present

## 2019-11-25 DIAGNOSIS — F1120 Opioid dependence, uncomplicated: Secondary | ICD-10-CM | POA: Diagnosis not present

## 2019-11-25 DIAGNOSIS — F329 Major depressive disorder, single episode, unspecified: Secondary | ICD-10-CM | POA: Diagnosis not present

## 2019-11-25 DIAGNOSIS — J45909 Unspecified asthma, uncomplicated: Secondary | ICD-10-CM | POA: Diagnosis not present

## 2019-11-25 DIAGNOSIS — J4550 Severe persistent asthma, uncomplicated: Secondary | ICD-10-CM | POA: Diagnosis not present

## 2019-11-25 DIAGNOSIS — N400 Enlarged prostate without lower urinary tract symptoms: Secondary | ICD-10-CM | POA: Diagnosis not present

## 2019-11-26 ENCOUNTER — Other Ambulatory Visit (INDEPENDENT_AMBULATORY_CARE_PROVIDER_SITE_OTHER): Payer: Medicare HMO

## 2019-11-26 ENCOUNTER — Encounter: Payer: Self-pay | Admitting: Gastroenterology

## 2019-11-26 ENCOUNTER — Ambulatory Visit (INDEPENDENT_AMBULATORY_CARE_PROVIDER_SITE_OTHER): Payer: Medicare HMO | Admitting: Gastroenterology

## 2019-11-26 VITALS — BP 120/72 | HR 114 | Ht 67.5 in | Wt 139.6 lb

## 2019-11-26 DIAGNOSIS — R609 Edema, unspecified: Secondary | ICD-10-CM | POA: Diagnosis not present

## 2019-11-26 DIAGNOSIS — G8929 Other chronic pain: Secondary | ICD-10-CM | POA: Diagnosis not present

## 2019-11-26 DIAGNOSIS — R1013 Epigastric pain: Secondary | ICD-10-CM

## 2019-11-26 DIAGNOSIS — R160 Hepatomegaly, not elsewhere classified: Secondary | ICD-10-CM

## 2019-11-26 DIAGNOSIS — K7030 Alcoholic cirrhosis of liver without ascites: Secondary | ICD-10-CM

## 2019-11-26 DIAGNOSIS — R112 Nausea with vomiting, unspecified: Secondary | ICD-10-CM | POA: Diagnosis not present

## 2019-11-26 DIAGNOSIS — K760 Fatty (change of) liver, not elsewhere classified: Secondary | ICD-10-CM

## 2019-11-26 DIAGNOSIS — F1010 Alcohol abuse, uncomplicated: Secondary | ICD-10-CM | POA: Diagnosis not present

## 2019-11-26 DIAGNOSIS — R600 Localized edema: Secondary | ICD-10-CM

## 2019-11-26 DIAGNOSIS — B182 Chronic viral hepatitis C: Secondary | ICD-10-CM | POA: Diagnosis not present

## 2019-11-26 LAB — COMPREHENSIVE METABOLIC PANEL
ALT: 19 U/L (ref 0–53)
AST: 39 U/L — ABNORMAL HIGH (ref 0–37)
Albumin: 2.7 g/dL — ABNORMAL LOW (ref 3.5–5.2)
Alkaline Phosphatase: 124 U/L — ABNORMAL HIGH (ref 39–117)
BUN: 3 mg/dL — ABNORMAL LOW (ref 6–23)
CO2: 29 mEq/L (ref 19–32)
Calcium: 8.3 mg/dL — ABNORMAL LOW (ref 8.4–10.5)
Chloride: 100 mEq/L (ref 96–112)
Creatinine, Ser: 0.61 mg/dL (ref 0.40–1.50)
GFR: 131.96 mL/min (ref >60.00)
Glucose, Bld: 91 mg/dL (ref 70–99)
Potassium: 4 mEq/L (ref 3.5–5.1)
Sodium: 137 mEq/L (ref 135–145)
Total Bilirubin: 1.2 mg/dL (ref 0.2–1.2)
Total Protein: 6.1 g/dL (ref 6.0–8.3)

## 2019-11-26 NOTE — Patient Instructions (Signed)
If you are age 67 or older, your body mass index should be between 23-30. Your Body mass index is 21.54 kg/m. If this is out of the aforementioned range listed, please consider follow up with your Primary Care Provider.  If you are age 64 or younger, your body mass index should be between 19-25. Your Body mass index is 21.54 kg/m. If this is out of the aformentioned range listed, please consider follow up with your Primary Care Provider.   Please follow up a low sodium diet ( 2,000mg) a day   You have been scheduled for a CT scan of the abdomen and pelvis at [facility], 1st floor Radiology. You are scheduled on [id number]  at 230pm. You should arrive 15 minutes prior to your appointment time for registration. Please follow the written instructions below on the day of your exam:    1) Do not eat anything after 1030am (4 hours prior to your test)  2) You have been given 2 bottles of oral contrast to drink.  The solution may taste better if refrigerated, but do NOT add ice or any other liquid to this solution. Shake well before drinking.   Drink 1 bottle of contrast @ 1230pm (2 hours prior to your exam)  Drink 1 bottle of contrast @ 130pm (1 hour prior to your exam)   You may take any medications as prescribed with a small amount of water, if necessary. If you take any of the following medications: METFORMIN, GLUCOPHAGE, GLUCOVANCE, AVANDAMET, RIOMET, FORTAMET, ACTOPLUS MET, JANUMET, GLUMETZA or METAGLIP, you MAY be asked to HOLD this medication 48 hours AFTER the exam.   The purpose of you drinking the oral contrast is to aid in the visualization of your intestinal tract. The contrast solution may cause some diarrhea. Depending on your individual set of symptoms, you may also receive an intravenous injection of x-ray contrast/dye. Plan on being at [facility] for 45 minutes or longer, depending on the type of exam you are having performed.   If you have any questions regarding your exam or if you  need to reschedule, you may call [facility] at [phone] between the hours of 8:00 am and 5:00 pm, Monday-Friday.   Your provider has requested that you go to the basement level for lab work before leaving today. Press "B" on the elevator. The lab is located at the first door on the left as you exit the elevator.  Due to recent changes in healthcare laws, you may see the results of your imaging and laboratory studies on MyChart before your provider has had a chance to review them.  We understand that in some cases there may be results that are confusing or concerning to you. Not all laboratory results come back in the same time frame and the provider may be waiting for multiple results in order to interpret others.  Please give us 48 hours in order for your provider to thoroughly review all the results before contacting the office for clarification of your results.    It was a pleasure to see you today!  Dr. Danis     

## 2019-11-26 NOTE — Progress Notes (Signed)
[facility] Progress Note  Chief Complaint: Nausea and epigastric pain   Subjective  History: Office consult here March of this year, history of hepatitis C, alcohol abuse with nausea and vomiting, history of opioid abuse on recovery therapy. On 08/22/2019, EGD and colonoscopy performed.  Upper endoscopy revealed a Schatzki ring disrupted with biopsy removal of tissue.  There was some mild gastric antral erythema and edema likely related to alcohol abuse, his biopsies were negative for H. pylori. No varices were seen.  Colonoscopy had a good preparation and a diminutive tubular adenoma removed from the transverse colon. Last liver imaging was an ultrasound in 2018.  Laboratory fibrosis score of 0.52 on 08/06/2019 suggested F2 fibrosis (done by ID clinic) Genotype 3 hepatitis C virus treated with 8 weeks of [place] starting late March this year.   Keelyn was seen with his wife Chong Sicilian in attendance for the entire visit.  She was able to provide additional information about some recent visits with the primary care provider and had copies of some labs which had not previously been available to [place].  She had questions about his symptoms, how much may be from his hepatitis C therapy, alcohol use, why his potassium was low and what to do with his diuretics. Ultimately, I was able to determine that [facility] primary care provider, Dr. Hulan Fess with [facility] internal medicine, have recently prescribed spironolactone 100 mg a day for edema and potassium chloride 20 mEq a day after a clinic visit on May 13. Labs that day showed a BUN of 6, creatinine 0.72, sodium 142, potassium very low at 2.9, chloride 100, bicarb 32, albumin 2.9. The edema apparently improved somewhat, and follow-up potassium level shortly afterwards showed a potassium of 4.9.  She was then told to stop the potassium supplement and continue the spironolactone. Mr. Deridder is a limited historian, his wife provides most of his history  today.  He finished hep C therapy recently.  He continues to drink alcohol most evenings in the morning awakens with an upset stomach that is not relieved until he has alcohol again.  ROS: Cardiovascular:  no chest pain Respiratory: no dyspnea Generalized fatigue Intermittent loss of appetite Peripheral edema Depression Remainder of systems negative except as above The patient's Past Medical, Family and Social History were reviewed and are on file in the EMR.  Objective:  Med list reviewed  Current Outpatient Medications:  .  albuterol (PROAIR HFA) 108 (90 Base) MCG/ACT inhaler, Inhale 2 puffs into the lungs every 6 (six) hours as needed for wheezing or shortness of breath., Disp: 18 g, Rfl: 5 .  albuterol (PROVENTIL) (2.5 MG/3ML) 0.083% nebulizer solution, Take 3 mLs (2.5 mg total) by nebulization every 6 (six) hours as needed for wheezing or shortness of breath., Disp: 75 mL, Rfl: 1 .  allopurinol (ZYLOPRIM) 100 MG tablet, , Disp: , Rfl: 1 .  Buprenorphine HCl-Naloxone HCl 12-3 MG FILM, in the morning and at bedtime. , Disp: , Rfl:  .  FLUoxetine (PROZAC) 20 MG capsule, Take 20 mg by mouth daily., Disp: , Rfl:  .  fluticasone (FLOVENT HFA) 110 MCG/ACT inhaler, Inhale into the lungs at bedtime., Disp: , Rfl:  .  Fluticasone-Salmeterol (ADVAIR DISKUS) 100-50 MCG/DOSE AEPB, Inhale 1 puff into the lungs in the morning and at bedtime., Disp: 60 each, Rfl: 5 .  Glecaprevir-Pibrentasvir (MAVYRET) 100-40 MG TABS, Take 3 tablets by mouth daily with breakfast., Disp: 84 tablet, Rfl: 1 .  hydrOXYzine (VISTARIL) 25 MG capsule,  at bedtime. , Disp: , Rfl:  .  Nebulizers (NEBULIZER COMPRESSOR) MISC, Use as directed Dx:, Disp: 1 each, Rfl: 0 .  omeprazole (PRILOSEC) 20 MG capsule, Take 20 mg by mouth at bedtime., Disp: , Rfl:  .  ondansetron (ZOFRAN) 4 MG tablet, Take 1 tablet (4 mg total) by mouth every 6 (six) hours., Disp: 12 tablet, Rfl: 0 .  pantoprazole (PROTONIX) 40 MG tablet, TAKE 1 TABLET  (40 MG TOTAL) BY MOUTH DAILY. TAKE 30-60 MIN BEFORE FIRST MEAL OF THE DAY, Disp: 90 tablet, Rfl: 1 .  promethazine (PHENERGAN) 12.5 MG tablet, Take 1 tablet (12.5 mg total) by mouth every 6 (six) hours as needed for nausea or vomiting., Disp: 30 tablet, Rfl: 2 .  Spacer/Aero-Holding Chambers (AEROCHAMBER MV) inhaler, Use as instructed, Disp: 1 each, Rfl: 0 .  spironolactone (ALDACTONE) 100 MG tablet, , Disp: , Rfl:  .  tamsulosin (FLOMAX) 0.4 MG CAPS capsule, Take 0.4 mg by mouth daily., Disp: , Rfl:  .  verapamil (CALAN-SR) 180 MG CR tablet, Take 360 mg by mouth daily., Disp: , Rfl: 11   Vital signs in last 24 hrs: Vitals:   11/26/19 1404  BP: 120/72  Pulse: (!) 114  SpO2: 96%    Physical Exam  Restricted affect, chronically ill-appearing man, poor muscle mass.  HEENT: sclera anicteric, oral mucosa moist without lesions  Neck: supple, no thyromegaly, JVD or lymphadenopathy  Cardiac: RRR without murmurs, S1S2 heard, tense bilateral edema above the knee bilaterally  Pulm: Scattered expiratory wheezing bilaterally, normal RR and effort noted  Abdomen: soft, left lobe of liver enlarged and tender, no distention bulging flanks or caput medusa.  No spleen tip palpable.  Skin; warm and dry, no jaundice or rash  Labs:  CMP Latest Ref Rng & Units 11/26/2019 10/31/2019 10/01/2019  Glucose 70 - 99 mg/dL 91 84 74  BUN 6 - 23 mg/dL 3(L) 4(L) 5(L)  Creatinine 0.40 - 1.50 mg/dL 0.61 0.73 0.65(L)  Sodium 135 - 145 mEq/L 137 138 143  Potassium 3.5 - 5.1 mEq/L 4.0 5.0 2.9(L)  Chloride 96 - 112 mEq/L 100 104 100  CO2 19 - 32 mEq/L 29 28 33(H)  Calcium 8.4 - 10.5 mg/dL 8.3(L) 8.2(L) 7.6(L)  Total Protein 6.0 - 8.3 g/dL 6.1 4.6(L) 4.6(L)  Total Bilirubin 0.2 - 1.2 mg/dL 1.2 2.1(H) 3.4(H)  Alkaline Phos 39 - 117 U/L 124(H) - -  AST 0 - 37 U/L 39(H) 36(H) 54(H)  ALT 0 - 53 U/L 19 21 36   CBC Latest Ref Rng & Units 10/17/2016 09/15/2016 10/22/2015  WBC 4.0 - 10.5 K/uL 16.7(H) 13.7(H) 6.8   Hemoglobin 13.0 - 17.0 g/dL 13.5 14.0 14.8  Hematocrit 39 - 52 % 39.3 41.8 41.2  Platelets 150 - 400 K/uL 314.0 418.0(H) 299   Lab Results  Component Value Date   INR 1.0 08/06/2019    ___________________________________________ Radiologic studies:  No recent abdominal imaging ____________________________________________ Other:   _____________________________________________ Assessment & Plan  Assessment: Encounter Diagnoses  Name Primary?  . Alcoholic cirrhosis of liver without ascites ([place]) Yes  . Peripheral edema   . Alcohol abuse   . Chronic hepatitis C without hepatic coma ([place])   . Hepatomegaly   . Nausea and vomiting in adult   . Abdominal pain, chronic, epigastric    Based on exam and peripheral edema, I believe he has progressed to cirrhosis from combination of hepatitis C (which was recently treated) and ongoing alcohol abuse.  It is still not clear  that he understands the nature of his condition and the relation between the alcohol use and his symptoms.  He also needs more coordinated care between myself and his primary care provider. Fortunately, preserved synthetic function with recent normal INR, no overt hepatic encephalopathy, and no varices on recent EGD. His low albumin is from the cirrhosis and malnutrition related to alcohol abuse.  The hypoalbuminemia is also worsening his edema.  Plan: CMP today, then we can make further recommendation on diuretics.  Typically would need combination of spironolactone and furosemide as tolerated by blood pressure.  I have made it abundantly clear that he unquestionably needs to stop using alcohol.  He needs support and counseling and I have strongly encouraged them to seek that.  CT scan abdomen and pelvis to evaluate for hepatomegaly and probable cirrhosis, also to screen for liver mass.  AFP with today's labs.  Follow-up will then be determined based on above results and plan for diuretics.  He will need follow-up  with both me and his primary care provider regularly.   40 minutes were spent on this encounter (including chart review, history/exam, counseling/coordination of care, and documentation)  Nelida Meuse III

## 2019-11-27 LAB — AFP TUMOR MARKER: AFP-Tumor Marker: 1.6 ng/mL (ref <6.1)

## 2019-11-29 ENCOUNTER — Other Ambulatory Visit: Payer: Self-pay

## 2019-11-29 DIAGNOSIS — K7030 Alcoholic cirrhosis of liver without ascites: Secondary | ICD-10-CM

## 2019-11-29 MED ORDER — FUROSEMIDE 40 MG PO TABS
40.0000 mg | ORAL_TABLET | Freq: Every day | ORAL | 1 refills | Status: DC
Start: 2019-11-29 — End: 2020-01-17

## 2019-12-03 ENCOUNTER — Other Ambulatory Visit: Payer: Self-pay

## 2019-12-03 ENCOUNTER — Ambulatory Visit (HOSPITAL_COMMUNITY)
Admission: RE | Admit: 2019-12-03 | Discharge: 2019-12-03 | Disposition: A | Discharge status: Home / Self Care | Payer: Medicare HMO | Source: Ambulatory Visit | Attending: Gastroenterology | Admitting: Gastroenterology

## 2019-12-03 ENCOUNTER — Encounter (HOSPITAL_COMMUNITY): Payer: Self-pay

## 2019-12-03 DIAGNOSIS — K7460 Unspecified cirrhosis of liver: Secondary | ICD-10-CM | POA: Diagnosis not present

## 2019-12-03 DIAGNOSIS — K5730 Diverticulosis of large intestine without perforation or abscess without bleeding: Secondary | ICD-10-CM | POA: Diagnosis not present

## 2019-12-03 DIAGNOSIS — K8689 Other specified diseases of pancreas: Secondary | ICD-10-CM | POA: Diagnosis not present

## 2019-12-03 DIAGNOSIS — R160 Hepatomegaly, not elsewhere classified: Secondary | ICD-10-CM | POA: Insufficient documentation

## 2019-12-03 DIAGNOSIS — N281 Cyst of kidney, acquired: Secondary | ICD-10-CM | POA: Diagnosis not present

## 2019-12-03 MED ORDER — SODIUM CHLORIDE (PF) 0.9 % IJ SOLN
INTRAMUSCULAR | Status: AC
  Filled 2019-12-03: qty 50

## 2019-12-03 MED ORDER — IOHEXOL 300 MG/ML  SOLN
100.0000 mL | Freq: Once | INTRAMUSCULAR | Status: AC | PRN
  Administered 2019-12-03: 100 mL via INTRAVENOUS

## 2019-12-06 ENCOUNTER — Other Ambulatory Visit: Payer: Self-pay

## 2019-12-06 DIAGNOSIS — R600 Localized edema: Secondary | ICD-10-CM

## 2019-12-13 ENCOUNTER — Telehealth: Payer: Self-pay

## 2019-12-13 NOTE — Telephone Encounter (Signed)
-----   Message from Algernon Huxley, RN sent at 11/29/2019 11:44 AM EDT ----- Regarding: lab Needs lab, order in

## 2019-12-13 NOTE — Telephone Encounter (Signed)
Pt aware, order in epic. 

## 2019-12-16 ENCOUNTER — Other Ambulatory Visit (INDEPENDENT_AMBULATORY_CARE_PROVIDER_SITE_OTHER): Payer: Medicare HMO

## 2019-12-16 DIAGNOSIS — K7030 Alcoholic cirrhosis of liver without ascites: Secondary | ICD-10-CM | POA: Diagnosis not present

## 2019-12-16 LAB — BASIC METABOLIC PANEL
BUN: 9 mg/dL (ref 6–23)
CO2: 32 mEq/L (ref 19–32)
Calcium: 8.1 mg/dL — ABNORMAL LOW (ref 8.4–10.5)
Chloride: 94 mEq/L — ABNORMAL LOW (ref 96–112)
Creatinine, Ser: 0.91 mg/dL (ref 0.40–1.50)
GFR: 83.16 mL/min (ref >60.00)
Glucose, Bld: 92 mg/dL (ref 70–99)
Potassium: 3.4 mEq/L — ABNORMAL LOW (ref 3.5–5.1)
Sodium: 135 mEq/L (ref 135–145)

## 2019-12-19 NOTE — Progress Notes (Addendum)
I have reviewed and agree with the plan of care.   Terri Piedra, NP

## 2019-12-24 ENCOUNTER — Other Ambulatory Visit: Payer: Self-pay

## 2019-12-24 DIAGNOSIS — M7989 Other specified soft tissue disorders: Secondary | ICD-10-CM

## 2019-12-24 DIAGNOSIS — R600 Localized edema: Secondary | ICD-10-CM

## 2019-12-26 ENCOUNTER — Other Ambulatory Visit: Payer: Self-pay

## 2019-12-26 ENCOUNTER — Ambulatory Visit (HOSPITAL_COMMUNITY)
Admission: RE | Admit: 2019-12-26 | Discharge: 2019-12-26 | Disposition: A | Discharge status: Home / Self Care | Payer: Medicare HMO | Source: Ambulatory Visit | Attending: Gastroenterology | Admitting: Gastroenterology

## 2019-12-26 DIAGNOSIS — M7989 Other specified soft tissue disorders: Secondary | ICD-10-CM

## 2019-12-26 NOTE — Progress Notes (Signed)
Lower extremity venous has been completed.   Preliminary results in CV Proc.   Abram Sander 12/26/2019 3:05 PM

## 2020-01-01 DIAGNOSIS — F329 Major depressive disorder, single episode, unspecified: Secondary | ICD-10-CM | POA: Diagnosis not present

## 2020-01-01 DIAGNOSIS — N400 Enlarged prostate without lower urinary tract symptoms: Secondary | ICD-10-CM | POA: Diagnosis not present

## 2020-01-01 DIAGNOSIS — J410 Simple chronic bronchitis: Secondary | ICD-10-CM | POA: Diagnosis not present

## 2020-01-01 DIAGNOSIS — J45901 Unspecified asthma with (acute) exacerbation: Secondary | ICD-10-CM | POA: Diagnosis not present

## 2020-01-01 DIAGNOSIS — J45909 Unspecified asthma, uncomplicated: Secondary | ICD-10-CM | POA: Diagnosis not present

## 2020-01-01 DIAGNOSIS — I10 Essential (primary) hypertension: Secondary | ICD-10-CM | POA: Diagnosis not present

## 2020-01-13 ENCOUNTER — Ambulatory Visit (INDEPENDENT_AMBULATORY_CARE_PROVIDER_SITE_OTHER): Payer: Medicare HMO | Admitting: Gastroenterology

## 2020-01-13 ENCOUNTER — Encounter: Payer: Self-pay | Admitting: Gastroenterology

## 2020-01-13 VITALS — BP 102/70 | HR 96 | Ht 67.5 in | Wt 138.0 lb

## 2020-01-13 DIAGNOSIS — F1010 Alcohol abuse, uncomplicated: Secondary | ICD-10-CM

## 2020-01-13 DIAGNOSIS — R112 Nausea with vomiting, unspecified: Secondary | ICD-10-CM

## 2020-01-13 DIAGNOSIS — R160 Hepatomegaly, not elsewhere classified: Secondary | ICD-10-CM

## 2020-01-13 DIAGNOSIS — R609 Edema, unspecified: Secondary | ICD-10-CM

## 2020-01-13 NOTE — Patient Instructions (Signed)
If you are age 67 or older, your body mass index should be between 23-30. Your Body mass index is 21.29 kg/m. If this is out of the aforementioned range listed, please consider follow up with your Primary Care Provider.  If you are age 7 or younger, your body mass index should be between 19-25. Your Body mass index is 21.29 kg/m. If this is out of the aformentioned range listed, please consider follow up with your Primary Care Provider.   It was a pleasure to see you today!  Dr. Loletha Carrow

## 2020-01-13 NOTE — Progress Notes (Signed)
[facility] Progress Note  Chief Complaint: Peripheral edema, alcoholic liver disease  Subjective  History: Gabriel Ramos was last seen in the office June 22, and I left detailed note about his alcoholic liver condition, dyspepsia with nausea, and worsening problems with peripheral edema.  I was concerned that with all that and hepatomegaly felt on exam that he may have cirrhosis. CT scan did not show obvious changes of cirrhosis, and there were no varices on the endoscopy earlier this year. The reports we got from phone calls with his wife for that the salt restriction and diuretics had helped his fluid retention quite a lot.  He still has nausea in the morning but says it has improved since I last saw him. He still drinks daily, but says he has cut down as best he can. His energy level is poor. He is looking forward to seeing the cardiologist later this week.  ROS: Cardiovascular:  no chest pain Respiratory: no dyspnea Is also bothered by a rash on his legs arms and torso. He says it feels like it is something "under the skin". It is difficult for him to get relief even if he takes Benadryl, and it often keeps him up at night. He says that his primary care provider has seen this. Remainder of systems negative except as above The patient's Past Medical, Family and Social History were reviewed and are on file in the EMR.  Objective:  Med list reviewed  Current Outpatient Medications:  .  albuterol (PROAIR HFA) 108 (90 Base) MCG/ACT inhaler, Inhale 2 puffs into the lungs every 6 (six) hours as needed for wheezing or shortness of breath., Disp: 18 g, Rfl: 5 .  albuterol (PROVENTIL) (2.5 MG/3ML) 0.083% nebulizer solution, Take 3 mLs (2.5 mg total) by nebulization every 6 (six) hours as needed for wheezing or shortness of breath., Disp: 75 mL, Rfl: 1 .  allopurinol (ZYLOPRIM) 100 MG tablet, , Disp: , Rfl: 1 .  Buprenorphine HCl-Naloxone HCl 12-3 MG FILM, in the morning and at bedtime. ,  Disp: , Rfl:  .  FLUoxetine (PROZAC) 20 MG capsule, Take 20 mg by mouth daily., Disp: , Rfl:  .  fluticasone (FLONASE) 50 MCG/ACT nasal spray, Place 1 spray into both nostrils daily., Disp: , Rfl:  .  fluticasone (FLOVENT HFA) 110 MCG/ACT inhaler, Inhale into the lungs at bedtime., Disp: , Rfl:  .  Fluticasone-Salmeterol (ADVAIR DISKUS) 100-50 MCG/DOSE AEPB, Inhale 1 puff into the lungs in the morning and at bedtime., Disp: 60 each, Rfl: 5 .  furosemide (LASIX) 40 MG tablet, Take 1 tablet (40 mg total) by mouth daily., Disp: 30 tablet, Rfl: 1 .  Glecaprevir-Pibrentasvir (MAVYRET) 100-40 MG TABS, Take 3 tablets by mouth daily with breakfast., Disp: 84 tablet, Rfl: 1 .  hydrOXYzine (VISTARIL) 25 MG capsule, at bedtime. , Disp: , Rfl:  .  Nebulizers (NEBULIZER COMPRESSOR) MISC, Use as directed Dx:, Disp: 1 each, Rfl: 0 .  omeprazole (PRILOSEC) 20 MG capsule, Take 20 mg by mouth at bedtime., Disp: , Rfl:  .  ondansetron (ZOFRAN) 4 MG tablet, Take 1 tablet (4 mg total) by mouth every 6 (six) hours., Disp: 12 tablet, Rfl: 0 .  pantoprazole (PROTONIX) 40 MG tablet, TAKE 1 TABLET (40 MG TOTAL) BY MOUTH DAILY. TAKE 30-60 MIN BEFORE FIRST MEAL OF THE DAY, Disp: 90 tablet, Rfl: 1 .  promethazine (PHENERGAN) 12.5 MG tablet, Take 1 tablet (12.5 mg total) by mouth every 6 (six) hours as needed for nausea  or vomiting., Disp: 30 tablet, Rfl: 2 .  Spacer/Aero-Holding Chambers (AEROCHAMBER MV) inhaler, Use as instructed, Disp: 1 each, Rfl: 0 .  spironolactone (ALDACTONE) 100 MG tablet, , Disp: , Rfl:  .  tamsulosin (FLOMAX) 0.4 MG CAPS capsule, Take 0.4 mg by mouth daily., Disp: , Rfl:  .  verapamil (CALAN-SR) 180 MG CR tablet, Take 360 mg by mouth daily., Disp: , Rfl: 11   Vital signs in last 24 hrs: Vitals:   01/13/20 1553  BP: 102/70  Pulse: 96    Physical Exam  [name], poor muscle mass as before  HEENT: sclera anicteric, oral mucosa moist without lesions  Neck: supple, no  thyromegaly, JVD or lymphadenopathy  Cardiac: RRR without murmurs, S1S2 heard, pitting edema to the knee bilaterally. It is less pronounced than when I last saw him, and no longer involving his thighs.  Pulm: clear to auscultation bilaterally, normal RR and effort noted  Abdomen: soft, no tenderness, with active bowel sounds. Left lobe liver enlarged as before. No distention or bulging flanks or caput medusa.  Skin; warm and dry, no jaundice. He has excoriations on his lower legs and some erythema from the edema.  Labs:  CMP Latest Ref Rng & Units 12/16/2019 11/26/2019 10/31/2019  Glucose 70 - 99 mg/dL 92 91 84  BUN 6 - 23 mg/dL 9 3(L) 4(L)  Creatinine 0.40 - 1.50 mg/dL 0.91 0.61 0.73  Sodium 135 - 145 mEq/L 135 137 138  Potassium 3.5 - 5.1 mEq/L 3.4(L) 4.0 5.0  Chloride 96 - 112 mEq/L 94(L) 100 104  CO2 19 - 32 mEq/L 32 29 28  Calcium 8.4 - 10.5 mg/dL 8.1(L) 8.3(L) 8.2(L)  Total Protein 6.0 - 8.3 g/dL - 6.1 4.6(L)  Total Bilirubin 0.2 - 1.2 mg/dL - 1.2 2.1(H)  Alkaline Phos 39 - 117 U/L - 124(H) -  AST 0 - 37 U/L - 39(H) 36(H)  ALT 0 - 53 U/L - 19 21   CBC Latest Ref Rng & Units 10/17/2016 09/15/2016 10/22/2015  WBC 4.0 - 10.5 K/uL 16.7(H) 13.7(H) 6.8  Hemoglobin 13.0 - 17.0 g/dL 13.5 14.0 14.8  Hematocrit 39 - 52 % 39.3 41.8 41.2  Platelets 150 - 400 K/uL 314.0 418.0(H) 299     Last INR 1.0 in march this year ___________________________________________ Radiologic studies:  B/l LE dopplers negative for DVT  __________________  CLINICAL DATA:  Chronic nausea for approximately 1 year. Lower extremity edema. Cirrhosis.   EXAM: CT ABDOMEN AND PELVIS WITH CONTRAST   TECHNIQUE: Multidetector CT imaging of the abdomen and pelvis was performed using the standard protocol following bolus administration of intravenous contrast.   CONTRAST:  175mL OMNIPAQUE IOHEXOL 300 MG/ML  SOLN   COMPARISON:  None.   FINDINGS: Lower Chest: No acute findings.   Hepatobiliary: No hepatic  masses identified. Severe diffuse hepatic steatosis is demonstrated. Gallbladder is unremarkable. No evidence of biliary ductal dilatation.   Pancreas: No mass or inflammatory changes. Several scattered pancreatic calcifications are noted, suspicious for chronic pancreatitis.   Spleen: Within normal limits in size and appearance.   Adrenals/Urinary Tract: No masses identified. A few small renal cysts are noted bilaterally. No evidence of ureteral calculi or hydronephrosis.   Stomach/Bowel: No evidence of obstruction, inflammatory process or abnormal fluid collections. Normal appendix visualized. Mild sigmoid diverticulosis is noted, however there is no evidence of diverticulitis.   Vascular/Lymphatic: No pathologically enlarged lymph nodes. No abdominal aortic aneurysm.   Reproductive:  No mass or other significant abnormality.  Other:  No evidence of ascites.   Musculoskeletal:  No suspicious bone lesions identified.   IMPRESSION: 1. No acute findings. 2. Severe hepatic steatosis. 3. Several pancreatic calcifications, suspicious for chronic pancreatitis 4. Mild sigmoid diverticulosis. No radiographic evidence of diverticulitis.     Electronically Signed   By: Marlaine Hind M.D.   On: 12/04/2019 09:40    ____________________________________________ Other:   _____________________________________________ Assessment & Plan  Assessment: Encounter Diagnoses  Name Primary?  . Hepatomegaly Yes  . Nausea and vomiting in adult   . Alcohol abuse   . Peripheral edema    Gabriel Ramos certainly has alcohol related severe fatty liver but does not seem to have cirrhosis based on labs and imaging. His hepatomegaly is from the alcohol-related fatty liver disease.  He still has significant peripheral edema at least partially related to his hypoalbuminemia. He has protein calorie malnutrition from alcohol abuse. I had arranged a cardiology consult in hopes they will schedule an  echocardiogram to look for any ventricular or valvular dysfunction contribute to his edema.  Plan: In addition to the above, he needs regular follow-up with his primary care to keep track of his edema and dose diuretics sufficiently. I again stressed the need for 2000 mg daily sodium restriction. Slowly working toward alcohol abstinence is in his long-term best interest. Also recommended he contact primary care and request a referral to a dermatologist for his chronic itching.   30 minutes were spent on this encounter (including chart review, history/exam, counseling/coordination of care, and documentation)  Nelida Meuse III   CC: Hulan Fess, MD (Internal Medicine) Richardson Dopp, PA (Cardiology )

## 2020-01-17 ENCOUNTER — Ambulatory Visit (INDEPENDENT_AMBULATORY_CARE_PROVIDER_SITE_OTHER): Payer: Medicare HMO | Admitting: Physician Assistant

## 2020-01-17 ENCOUNTER — Other Ambulatory Visit: Payer: Self-pay

## 2020-01-17 ENCOUNTER — Encounter: Payer: Self-pay | Admitting: Physician Assistant

## 2020-01-17 VITALS — BP 120/80 | HR 86 | Ht 67.5 in | Wt 136.6 lb

## 2020-01-17 DIAGNOSIS — R0602 Shortness of breath: Secondary | ICD-10-CM

## 2020-01-17 DIAGNOSIS — K700 Alcoholic fatty liver: Secondary | ICD-10-CM

## 2020-01-17 DIAGNOSIS — E46 Unspecified protein-calorie malnutrition: Secondary | ICD-10-CM | POA: Diagnosis not present

## 2020-01-17 DIAGNOSIS — I10 Essential (primary) hypertension: Secondary | ICD-10-CM | POA: Diagnosis not present

## 2020-01-17 DIAGNOSIS — M7989 Other specified soft tissue disorders: Secondary | ICD-10-CM

## 2020-01-17 MED ORDER — POTASSIUM CHLORIDE CRYS ER 20 MEQ PO TBCR
20.0000 meq | EXTENDED_RELEASE_TABLET | Freq: Every day | ORAL | 5 refills | Status: DC
Start: 2020-01-17 — End: 2020-02-19

## 2020-01-17 MED ORDER — TORSEMIDE 20 MG PO TABS: 20.0000 mg | ORAL_TABLET | Freq: Every day | ORAL | 6 refills | Status: AC

## 2020-01-17 NOTE — Patient Instructions (Signed)
Medication Instructions:  Your physician has recommended you make the following change in your medication:   1) Stop Furosemide 2) Start Torsemide 20 mg, 1 tablet by mouth once a day  *If you need a refill on your cardiac medications before your next appointment, please call your pharmacy*  Lab Work: You will have labs drawn today: BMET/BNP  Your physician recommends that you return for lab work in: 1 week for BMET  Testing/Procedures: Your physician has requested that you have an echocardiogram. Echocardiography is a painless test that uses sound waves to create images of your heart. It provides your doctor with information about the size and shape of your heart and how well your hearts chambers and valves are working. This procedure takes approximately one hour. There are no restrictions for this procedure.  Follow-Up: At [facility], you and your health needs are our priority.  As part of our continuing mission to provide you with exceptional heart care, we have created designated Provider Care Teams.  These Care Teams include your primary Cardiologist (physician) and Advanced Practice Providers (APPs -  Physician Assistants and Nurse Practitioners) who all work together to provide you with the care you need, when you need it.  We recommend signing up for the patient portal called "MyChart".  Sign up information is provided on this After Visit Summary.  MyChart is used to connect with patients for Virtual Visits (Telemedicine).  Patients are able to view lab/test results, encounter notes, upcoming appointments, etc.  Non-urgent messages can be sent to your provider as well.   To learn more about what you can do with MyChart, go to [email or web address].    Your next appointment:   3 week(s)  The format for your next appointment:   In Person  Provider:   You may see Fransico Him, MD or Richardson Dopp, PA-C

## 2020-01-17 NOTE — Progress Notes (Signed)
Cardiology Office Note:    Date:  01/17/2020   ID:  Gwen Pounds, DOB 02-26-1951, MRN [id number]  PCP:  Hulan Fess, MD  Cardiologist:  Fransico Him, MD  Electrophysiologist:  None   Referring MD: Doran Stabler, MD   Chief Complaint:  Leg Swelling    Patient Profile:    Gabriel Ramos is a 69 y.o. 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, no varices on EGD)  Hep C, s/p Rx  ETOH abuse  Hx of opioid abuse, on recovery Rx  Protein calorie malnutrition (hypoalbuminemia)  Albumin 6/21: 2.7  Asthma, severe persistent  Depression  GERD  History of SVT in 2013  Myoview 2013 low risk  History of syncope in 2017 (normal event monitor)  Prior CV studies: Venous US 12/26/2019 No DVT bilaterally  Event monitor 10/03/2018 Sinus rhythm, average heart rate 79 (55-157); occasional PACs, PVCs  Echocardiogram 04/18/2016 Mild concentric LVH, EF 55, GR 1 DD, trace MR, trace PI, mild TR, normal RVSF  History of Present Illness:    Gabriel Ramos is a 69 y.o. male with the above hx who is being seen today for the evaluation of LE edema at the request of Doran Stabler, MD.   He was evaluated by Dr. Radford Pax in March 2020 for palpitations.  An event monitor at that time demonstrated sinus rhythm and no significant arrhythmias.  He has recently been evaluated by GI (Dr. Loletha Carrow) for suspected cirrhosis.  He has had significant peripheral edema.  Evaluation has demonstrated alcoholic fatty liver disease but no evidence of cirrhosis.  EGD was negative for esophageal varices.  Despite high doses of diuretic therapy, his lower extremity swelling has been difficult to manage.  His albumin is low at 2.7.  He was referred back to cardiology for further evaluation.  He is here alone today.  He notes leg swelling over the past 2 to 3 months.  It was much worse up into his thighs.  It has improved but his legs below his knees still remain quite swollen.  There is no  improvement with elevation.  He does note shortness of breath with exertion.  He attributes this to his asthma.  This has been fairly stable symptom.  However, he does no exercise intolerance.  He has not had syncope.  He has difficulty with nausea, especially the mornings.  He drinks brandy in the mornings to help with this.  His nausea seems to get worse when he lays flat.  He sleeps in a recliner.  He has not had orthopnea or paroxysmal nocturnal dyspnea.  Past Medical History:  Diagnosis Date  . Abnormal CXR 09/16/2016   Report 09/07/16 c/w granuloma  . Alcohol dependence with unspecified alcohol-induced disorder ([place])   . Alcoholic hepatitis without ascites   . ALLERGIC RHINITIS 09/14/2006   Qualifier: Diagnosis of  By: [name]    . Anxiety   . Asthma, chronic, severe persistent, uncomplicated 5/42/7062   Allergy profile 09/15/2016 >  Eos 0.5/  IgE  271 Grass > dog  - 09/15/2016    try symbicort 160 2bid  - 10/17/2016  After extensive coaching HFA effectiveness =    75%  (ti too short)  - sinus CT 03/02/2017 >  Pos polyp    . BPH (benign prostatic hyperplasia)   . Concentric left ventricular hypertrophy   . DEPRESSION 09/14/2006   Qualifier: Diagnosis of  By: [name]    . GERD (gastroesophageal reflux disease)   .  Gout 03/28/2012  . Hepatitis C   . History of paroxysmal supraventricular tachycardia   . Hypertension   . Hypoalbuminemia   . Hypokalemia   . Nausea   . Opioid abuse ([place])   . Simple chronic bronchitis ([place])   . SVT (supraventricular tachycardia) ([place])    2013,echo Varanasi NI LV EF ,mild MR: nl stress cl 3/13  . SVT (supraventricular tachycardia) ([place]) 08/12/2018   Cardiac work-up in 2013 normal  . Vertigo     Current Medications: Current Meds  Medication Sig  . albuterol (PROAIR HFA) 108 (90 Base) MCG/ACT inhaler Inhale 2 puffs into the lungs every 6 (six) hours as needed for wheezing or shortness of breath.  Marland Kitchen albuterol (PROVENTIL) (2.5 MG/3ML) 0.083%  nebulizer solution Take 3 mLs (2.5 mg total) by nebulization every 6 (six) hours as needed for wheezing or shortness of breath.  . Buprenorphine HCl-Naloxone HCl 12-3 MG FILM in the morning and at bedtime.   Marland Kitchen FLUoxetine (PROZAC) 20 MG capsule Take 20 mg by mouth daily.  . fluticasone (FLONASE) 50 MCG/ACT nasal spray Place 1 spray into both nostrils daily.  . fluticasone (FLOVENT HFA) 110 MCG/ACT inhaler Inhale into the lungs at bedtime.  . Glecaprevir-Pibrentasvir (MAVYRET) 100-40 MG TABS Take 3 tablets by mouth daily with breakfast.  . Nebulizers (NEBULIZER COMPRESSOR) MISC Use as directed Dx:  . ondansetron (ZOFRAN) 4 MG tablet Take 1 tablet (4 mg total) by mouth every 6 (six) hours.  . pantoprazole (PROTONIX) 40 MG tablet TAKE 1 TABLET (40 MG TOTAL) BY MOUTH DAILY. TAKE 30-60 MIN BEFORE FIRST MEAL OF THE DAY  . Spacer/Aero-Holding Chambers (AEROCHAMBER MV) inhaler Use as instructed  . spironolactone (ALDACTONE) 100 MG tablet   . [DISCONTINUED] furosemide (LASIX) 40 MG tablet Take 1 tablet (40 mg total) by mouth daily.     Allergies:   Aspirin and Penicillins   Social History   Tobacco Use  . Smoking status: Never Smoker  . Smokeless tobacco: Never Used  Vaping Use  . Vaping Use: Never used  Substance Use Topics  . Alcohol use: Yes    Alcohol/week: 3.0 standard drinks    Types: 3 Shots of liquor per week    Comment: smirnoffs, BRANDY BOTTLE EVERY OTHER DAY  . Drug use: Yes    Frequency: 7.0 times per week    Types: Marijuana    Comment: this morning 08/27/19     Family Hx: The patient's family history includes Asthma in his brother; Heart disease in his brother and father; Heart murmur in his mother; Liver cancer in his brother. There is no history of Colon cancer, Esophageal cancer, Rectal cancer, or Stomach cancer.  Review of Systems  Respiratory: Positive for wheezing. Negative for cough.   Gastrointestinal: Positive for nausea and vomiting. Negative for hematochezia and  melena.  Genitourinary: Negative for hematuria.     EKGs/Labs/Other Test Reviewed:    EKG:  EKG is  ordered today.  The ekg ordered today demonstrates increased artifact, normal sinus rhythm, heart rate 86, inferior Q waves, low voltage, QTC 495, similar to prior tracings  Recent Labs: 11/26/2019: ALT 19 12/16/2019: BUN 9; Creatinine, Ser 0.91; Potassium 3.4; Sodium 135   Recent Lipid Panel Lab Results  Component Value Date/Time   CHOL 156 10/24/2006 01:36 PM   TRIG 83 10/24/2006 01:36 PM   HDL 58.1 10/24/2006 01:36 PM   CHOLHDL 2.7 CALC 10/24/2006 01:36 PM   LDLCALC 81 10/24/2006 01:36 PM    Physical Exam:  VS:  BP 120/80   Pulse 86   Ht 5' 7.5" (1.715 m)   Wt 136 lb 9.6 oz (62 kg)   SpO2 97%   BMI 21.08 kg/m     Wt Readings from Last 3 Encounters:  01/17/20 136 lb 9.6 oz (62 kg)  01/13/20 138 lb (62.6 kg)  11/26/19 139 lb 9.6 oz (63.3 kg)     Constitutional:      Appearance: Healthy appearance. Not in distress.  Neck:     Vascular: JVD normal.  Pulmonary:     Effort: Pulmonary effort is normal.     Breath sounds: No wheezing. No rales.  Cardiovascular:     Normal rate. Regular rhythm. Normal S1. Normal S2.     Murmurs: There is no murmur.  Edema:    Pretibial: bilateral 3+ edema of the pretibial area.    Ankle: bilateral 2+ edema of the ankle.    Comments: tight Abdominal:     Palpations: Abdomen is soft.     Tenderness: There is no abdominal tenderness.  Skin:    General: Skin is warm and dry.  Neurological:     General: No focal deficit present.     Mental Status: Alert and oriented to person, place and time.     Cranial Nerves: Cranial nerves are intact.       ASSESSMENT & PLAN:    1. Leg swelling 2. Shortness of breath 3. Protein-calorie malnutrition, unspecified severity ([place]) The degree of swelling in his legs raises concerns for right-sided heart failure.  However, his neck veins are flat.  His low albumin may also be contributing.  As  noted, he has had evaluation with gastroenterology.  He does not appear to have cirrhosis.  He does have a strong family history of coronary artery disease.  His brother died of a myocardial infarction at age 86 and his father died at age 1.  He is not having chest pain but question if his shortness of breath and exercise intolerance may be an anginal equivalent.  He does have inferior Q waves on electrocardiogram.  I will obtain an echocardiogram to assess LV function, RV function and pulmonary pressures.  If his EF is normal, I will obtain a Lexiscan Myoview to rule out ischemia.  If his EF is down, we will need to consider cardiac catheterization (right and left).  I will also obtain a BMET, BNP today.  He may have a better response to torsemide than furosemide.  I will stop his furosemide and start torsemide 20 mg daily.  Start potassium 20 mEq daily.  Obtain BMET in 1 week.  Follow-up with Dr. Radford Pax or me in 3 weeks.  4. Alcoholic fatty liver Continue follow-up with GI.  He is status post treatment for hepatitis C.  5. Essential hypertension The patient's blood pressure is controlled on his current regimen.  Continue current therapy.     Dispo:  Return in about 3 weeks (around 02/07/2020) for Follow up after testing w/ Dr. Heron Nay, or Richardson Dopp, PA-C, in person.   Medication Adjustments/Labs and Tests Ordered: Current medicines are reviewed at length with the patient today.  Concerns regarding medicines are outlined above.  Tests Ordered: Orders Placed This Encounter  Procedures  . Basic metabolic panel  . Pro b natriuretic peptide (BNP)  . Basic metabolic panel  . ECHOCARDIOGRAM COMPLETE   Medication Changes: Meds ordered this encounter  Medications  . torsemide (DEMADEX) 20 MG tablet    Sig: Take 1  tablet (20 mg total) by mouth daily.    Dispense:  30 tablet    Refill:  6  . potassium chloride SA (KLOR-CON) 20 MEQ tablet    Sig: Take 1 tablet (20 mEq total) by mouth daily.     Dispense:  30 tablet    Refill:  5    Order Specific Question:   Supervising Provider    Answer:   Lelon Perla [[id number]]    Signed, Richardson Dopp, PA-C  01/17/2020 1:29 PM    [facility] [address], [place], [place]  [address] Phone: [phone]; Fax: [phone]

## 2020-01-18 LAB — BASIC METABOLIC PANEL
BUN/Creatinine Ratio: 9 — ABNORMAL LOW (ref 10–24)
BUN: 8 mg/dL (ref 8–27)
CO2: 31 mmol/L — ABNORMAL HIGH (ref 20–29)
Calcium: 8.3 mg/dL — ABNORMAL LOW (ref 8.6–10.2)
Chloride: 96 mmol/L (ref 96–106)
Creatinine, Ser: 0.85 mg/dL (ref 0.76–1.27)
GFR calc Af Amer: 105 mL/min/{1.73_m2} (ref >59)
GFR calc non Af Amer: 91 mL/min/{1.73_m2} (ref >59)
Glucose: 92 mg/dL (ref 65–99)
Potassium: 3.4 mmol/L — ABNORMAL LOW (ref 3.5–5.2)
Sodium: 140 mmol/L (ref 134–144)

## 2020-01-18 LAB — PRO B NATRIURETIC PEPTIDE: NT-Pro BNP: 544 pg/mL — ABNORMAL HIGH (ref 0–376)

## 2020-01-21 NOTE — Addendum Note (Signed)
Addended by: Briant Cedar on: 01/21/2020 07:58 AM   Modules accepted: Orders

## 2020-01-22 ENCOUNTER — Other Ambulatory Visit: Payer: Self-pay

## 2020-01-22 DIAGNOSIS — E876 Hypokalemia: Secondary | ICD-10-CM

## 2020-01-22 DIAGNOSIS — I10 Essential (primary) hypertension: Secondary | ICD-10-CM

## 2020-01-22 DIAGNOSIS — M7989 Other specified soft tissue disorders: Secondary | ICD-10-CM

## 2020-01-22 NOTE — Progress Notes (Signed)
The patient has been notified of the result and verbalized understanding. All questions (if any) were answered. Patient will increase potassium to 20 mEq twice a day, patient will come in on 01/29/20 for repeat labs. Mady Haagensen, [place] 01/22/2020 4:15 PM

## 2020-01-27 ENCOUNTER — Ambulatory Visit (INDEPENDENT_AMBULATORY_CARE_PROVIDER_SITE_OTHER): Payer: Medicare HMO | Admitting: Pharmacist

## 2020-01-27 ENCOUNTER — Other Ambulatory Visit: Payer: Self-pay

## 2020-01-27 DIAGNOSIS — B182 Chronic viral hepatitis C: Secondary | ICD-10-CM

## 2020-01-27 NOTE — Progress Notes (Signed)
HPI: Gabriel Ramos is a 69 y.o. male who presents to the [facility] for Hepatitis C follow-up.  Medication:Mavyret x 8 weeks  Start Date:~09/02/19  Hepatitis C Genotype:3  Fibrosis Score:F2  Hepatitis C RNA:239,000 in 08/2019; Undetectable on 10/01/19 and 10/31/19  Patient Active Problem List   Diagnosis Date Noted  . Chronic hepatitis C without hepatic coma ([place]) 08/07/2019  . Palpitations 08/12/2018  . SVT (supraventricular tachycardia) ([place]) 08/12/2018  . Upper respiratory infection 10/17/2016  . Essential hypertension 09/16/2016  . Abnormal CXR 09/16/2016  . Gout 03/28/2012  . DEPRESSION 09/14/2006  . ALLERGIC RHINITIS 09/14/2006  . Asthma, chronic, severe persistent, uncomplicated 03/55/9741    Patient's Medications  New Prescriptions   No medications on file  Previous Medications   ALBUTEROL (PROAIR HFA) 108 (90 BASE) MCG/ACT INHALER    Inhale 2 puffs into the lungs every 6 (six) hours as needed for wheezing or shortness of breath.   ALBUTEROL (PROVENTIL) (2.5 MG/3ML) 0.083% NEBULIZER SOLUTION    Take 3 mLs (2.5 mg total) by nebulization every 6 (six) hours as needed for wheezing or shortness of breath.   BUPRENORPHINE HCL-NALOXONE HCL 12-3 MG FILM    in the morning and at bedtime.    FLUOXETINE (PROZAC) 20 MG CAPSULE    Take 20 mg by mouth daily.   FLUTICASONE (FLONASE) 50 MCG/ACT NASAL SPRAY    Place 1 spray into both nostrils daily.   FLUTICASONE (FLOVENT HFA) 110 MCG/ACT INHALER    Inhale into the lungs at bedtime.   GLECAPREVIR-PIBRENTASVIR (MAVYRET) 100-40 MG TABS    Take 3 tablets by mouth daily with breakfast.   NEBULIZERS (NEBULIZER COMPRESSOR) MISC    Use as directed Dx:   ONDANSETRON (ZOFRAN) 4 MG TABLET    Take 1 tablet (4 mg total) by mouth every 6 (six) hours.   PANTOPRAZOLE (PROTONIX) 40 MG TABLET    TAKE 1 TABLET (40 MG TOTAL) BY MOUTH DAILY. TAKE 30-60 MIN BEFORE FIRST MEAL OF THE DAY   POTASSIUM CHLORIDE SA (KLOR-CON) 20 MEQ TABLET     Take 1 tablet (20 mEq total) by mouth daily.   SPACER/AERO-HOLDING CHAMBERS (AEROCHAMBER MV) INHALER    Use as instructed   SPIRONOLACTONE (ALDACTONE) 100 MG TABLET       TORSEMIDE (DEMADEX) 20 MG TABLET    Take 1 tablet (20 mg total) by mouth daily.  Modified Medications   No medications on file  Discontinued Medications   No medications on file    Allergies: Allergies  Allergen Reactions  . Aspirin Shortness Of Breath and Swelling    REACTION: ASTHMA  . Penicillins Swelling    Makes lips and tongue swell up Has patient had a PCN reaction causing immediate rash, facial/tongue/throat swelling, SOB or lightheadedness with hypotension: yes Has patient had a PCN reaction causing severe rash involving mucus membranes or skin necrosis: no Has patient had a PCN reaction that required hospitalization: no Has patient had a PCN reaction occurring within the last 10 years: no, a little over 10 years If all of the above answers are "NO", then may proceed with Cephalosporin use.     Past Medical History: Past Medical History:  Diagnosis Date  . Abnormal CXR 09/16/2016   Report 09/07/16 c/w granuloma  . Alcohol dependence with unspecified alcohol-induced disorder ([place])   . Alcoholic hepatitis without ascites   . ALLERGIC RHINITIS 09/14/2006   Qualifier: Diagnosis of  By: [name]    . Anxiety   . Asthma,  chronic, severe persistent, uncomplicated 2/99/3716   Allergy profile 09/15/2016 >  Eos 0.5/  IgE  271 Grass > dog  - 09/15/2016    try symbicort 160 2bid  - 10/17/2016  After extensive coaching HFA effectiveness =    75%  (ti too short)  - sinus CT 03/02/2017 >  Pos polyp    . BPH (benign prostatic hyperplasia)   . Concentric left ventricular hypertrophy   . DEPRESSION 09/14/2006   Qualifier: Diagnosis of  By: [name]    . GERD (gastroesophageal reflux disease)   . Gout 03/28/2012  . Hepatitis C   . History of paroxysmal supraventricular tachycardia   . Hypertension   .  Hypoalbuminemia   . Hypokalemia   . Nausea   . Opioid abuse ([place])   . Simple chronic bronchitis ([place])   . SVT (supraventricular tachycardia) ([place])    2013,echo Varanasi NI LV EF ,mild MR: nl stress cl 3/13  . SVT (supraventricular tachycardia) ([place]) 08/12/2018   Cardiac work-up in 2013 normal  . Vertigo     Social History: Social History   Socioeconomic History  . Marital status: Significant Other    Spouse name: Not on file  . Number of children: 1  . Years of education: Not on file  . Highest education level: Not on file  Occupational History  . Occupation: retired  Tobacco Use  . Smoking status: Never Smoker  . Smokeless tobacco: Never Used  Vaping Use  . Vaping Use: Never used  Substance and Sexual Activity  . Alcohol use: Yes    Alcohol/week: 3.0 standard drinks    Types: 3 Shots of liquor per week    Comment: smirnoffs, BRANDY BOTTLE EVERY OTHER DAY  . Drug use: Yes    Frequency: 7.0 times per week    Types: Marijuana    Comment: this morning 08/27/19  . Sexual activity: Yes    Birth control/protection: None  Other Topics Concern  . Not on file  Social History Narrative  . Not on file   Social Determinants of Health   Financial Resource Strain:   . Difficulty of Paying Living Expenses: Not on file  Food Insecurity:   . Worried About Charity fundraiser in the Last Year: Not on file  . Ran Out of Food in the Last Year: Not on file  Transportation Needs:   . Lack of Transportation (Medical): Not on file  . Lack of Transportation (Non-Medical): Not on file  Physical Activity:   . Days of Exercise per Week: Not on file  . Minutes of Exercise per Session: Not on file  Stress:   . Feeling of Stress : Not on file  Social Connections:   . Frequency of Communication with Friends and Family: Not on file  . Frequency of Social Gatherings with Friends and Family: Not on file  . Attends Religious Services: Not on file  . Active Member of Clubs or Organizations:  Not on file  . Attends Archivist Meetings: Not on file  . Marital Status: Not on file    Labs: Hepatitis C Lab Results  Component Value Date   HCVGENOTYPE 3 08/06/2019   HCVRNAPCRQN <15 DETECTED (A) 10/31/2019   HCVRNAPCRQN <15 DETECTED (A) 10/01/2019   FIBROSTAGE F2 08/06/2019   Hepatitis B Lab Results  Component Value Date   HEPBSAB NON-REACTIVE 08/06/2019   HEPBSAG NON-REACTIVE 08/06/2019   Hepatitis A No results found for: HAV HIV No results found for: HIV Lab  Results  Component Value Date   CREATININE 0.85 01/17/2020   CREATININE 0.91 12/16/2019   CREATININE 0.61 11/26/2019   CREATININE 0.73 10/31/2019   CREATININE 0.65 (L) 10/01/2019   Lab Results  Component Value Date   AST 39 (H) 11/26/2019   AST 36 (H) 10/31/2019   AST 54 (H) 10/01/2019   ALT 19 11/26/2019   ALT 21 10/31/2019   ALT 36 10/01/2019   INR 1.0 08/06/2019    Assessment: Gabriel Ramos is here today for his SVR12 Hepatitis C cure visit. He finished treatment back in May and had an undetectable viral load in April and May. Encouraged him to stay away from risky behavior and that his Hepatitis C antibody will be positive lifelong. All questions answered. Will get a final viral load for cure.   Plan: - Hep C RNA today  Gabriel Thoennes L. Charlestine Ramos, PharmD, BCIDP, AAHIVP, CPP Clinical Pharmacist Practitioner [facility] 01/27/2020, 2:42 PM

## 2020-01-29 ENCOUNTER — Other Ambulatory Visit: Payer: Medicare HMO

## 2020-01-29 LAB — COMPREHENSIVE METABOLIC PANEL
AG Ratio: 1.2 (calc) (ref 1.0–2.5)
ALT: 20 U/L (ref 9–46)
AST: 33 U/L (ref 10–35)
Albumin: 2.8 g/dL — ABNORMAL LOW (ref 3.6–5.1)
Alkaline phosphatase (APISO): 91 U/L (ref 35–144)
BUN: 16 mg/dL (ref 7–25)
CO2: 29 mmol/L (ref 20–32)
Calcium: 8.1 mg/dL — ABNORMAL LOW (ref 8.6–10.3)
Chloride: 96 mmol/L — ABNORMAL LOW (ref 98–110)
Creat: 1.23 mg/dL (ref 0.70–1.25)
Globulin: 2.4 g/dL (calc) (ref 1.9–3.7)
Glucose, Bld: 100 mg/dL — ABNORMAL HIGH (ref 65–99)
Potassium: 4.1 mmol/L (ref 3.5–5.3)
Sodium: 137 mmol/L (ref 135–146)
Total Bilirubin: 0.9 mg/dL (ref 0.2–1.2)
Total Protein: 5.2 g/dL — ABNORMAL LOW (ref 6.1–8.1)

## 2020-01-29 LAB — HEPATITIS C RNA QUANTITATIVE
HCV RNA, PCR, QN (Log): <1.18 log IU/mL
HCV RNA, PCR, QN: <15 IU/mL

## 2020-01-30 ENCOUNTER — Other Ambulatory Visit: Payer: Medicare HMO | Admitting: *Deleted

## 2020-01-30 ENCOUNTER — Other Ambulatory Visit: Payer: Self-pay

## 2020-01-30 DIAGNOSIS — J4550 Severe persistent asthma, uncomplicated: Secondary | ICD-10-CM | POA: Diagnosis not present

## 2020-01-30 DIAGNOSIS — M7989 Other specified soft tissue disorders: Secondary | ICD-10-CM | POA: Diagnosis not present

## 2020-01-30 DIAGNOSIS — F329 Major depressive disorder, single episode, unspecified: Secondary | ICD-10-CM | POA: Diagnosis not present

## 2020-01-30 DIAGNOSIS — I10 Essential (primary) hypertension: Secondary | ICD-10-CM | POA: Diagnosis not present

## 2020-01-30 DIAGNOSIS — E876 Hypokalemia: Secondary | ICD-10-CM | POA: Diagnosis not present

## 2020-01-30 DIAGNOSIS — N400 Enlarged prostate without lower urinary tract symptoms: Secondary | ICD-10-CM | POA: Diagnosis not present

## 2020-01-30 DIAGNOSIS — J410 Simple chronic bronchitis: Secondary | ICD-10-CM | POA: Diagnosis not present

## 2020-01-30 DIAGNOSIS — J45909 Unspecified asthma, uncomplicated: Secondary | ICD-10-CM | POA: Diagnosis not present

## 2020-01-30 DIAGNOSIS — J45901 Unspecified asthma with (acute) exacerbation: Secondary | ICD-10-CM | POA: Diagnosis not present

## 2020-01-31 LAB — BASIC METABOLIC PANEL
BUN/Creatinine Ratio: 13 (ref 10–24)
BUN: 14 mg/dL (ref 8–27)
CO2: 27 mmol/L (ref 20–29)
Calcium: 8.3 mg/dL — ABNORMAL LOW (ref 8.6–10.2)
Chloride: 92 mmol/L — ABNORMAL LOW (ref 96–106)
Creatinine, Ser: 1.08 mg/dL (ref 0.76–1.27)
GFR calc Af Amer: 82 mL/min/{1.73_m2} (ref >59)
GFR calc non Af Amer: 71 mL/min/{1.73_m2} (ref >59)
Glucose: 76 mg/dL (ref 65–99)
Potassium: 3.8 mmol/L (ref 3.5–5.2)
Sodium: 134 mmol/L (ref 134–144)

## 2020-01-31 LAB — TSH: TSH: 1.52 u[IU]/mL (ref 0.450–4.500)

## 2020-02-11 DIAGNOSIS — Z23 Encounter for immunization: Secondary | ICD-10-CM | POA: Diagnosis not present

## 2020-02-18 ENCOUNTER — Other Ambulatory Visit (HOSPITAL_COMMUNITY): Payer: Medicare HMO

## 2020-02-19 ENCOUNTER — Telehealth: Payer: Self-pay | Admitting: Physician Assistant

## 2020-02-19 ENCOUNTER — Ambulatory Visit (INDEPENDENT_AMBULATORY_CARE_PROVIDER_SITE_OTHER): Payer: Medicare HMO | Admitting: Physician Assistant

## 2020-02-19 ENCOUNTER — Other Ambulatory Visit: Payer: Self-pay

## 2020-02-19 ENCOUNTER — Encounter: Payer: Self-pay | Admitting: Physician Assistant

## 2020-02-19 ENCOUNTER — Ambulatory Visit (HOSPITAL_COMMUNITY): Payer: Medicare HMO | Attending: Internal Medicine

## 2020-02-19 VITALS — BP 109/75 | HR 95 | Ht 67.5 in | Wt 136.0 lb

## 2020-02-19 DIAGNOSIS — I10 Essential (primary) hypertension: Secondary | ICD-10-CM | POA: Diagnosis not present

## 2020-02-19 DIAGNOSIS — R002 Palpitations: Secondary | ICD-10-CM | POA: Diagnosis not present

## 2020-02-19 DIAGNOSIS — R0602 Shortness of breath: Secondary | ICD-10-CM

## 2020-02-19 DIAGNOSIS — M7989 Other specified soft tissue disorders: Secondary | ICD-10-CM

## 2020-02-19 DIAGNOSIS — J45909 Unspecified asthma, uncomplicated: Secondary | ICD-10-CM | POA: Insufficient documentation

## 2020-02-19 DIAGNOSIS — R600 Localized edema: Secondary | ICD-10-CM | POA: Insufficient documentation

## 2020-02-19 DIAGNOSIS — R21 Rash and other nonspecific skin eruption: Secondary | ICD-10-CM

## 2020-02-19 DIAGNOSIS — E8809 Other disorders of plasma-protein metabolism, not elsewhere classified: Secondary | ICD-10-CM

## 2020-02-19 LAB — ECHOCARDIOGRAM COMPLETE
Area-P 1/2: 3.81 cm2
S' Lateral: 1.8 cm

## 2020-02-19 MED ORDER — POTASSIUM CHLORIDE 20 MEQ/15ML (10%) PO SOLN: 20.0000 meq | Freq: Every day | ORAL | 11 refills | Status: AC

## 2020-02-19 NOTE — Patient Instructions (Signed)
Medication Instructions:  Your physician has recommended you make the following change in your medication:   1) Restart Potassium 20 mEq, take 1 tbsp by mouth once a day  *If you need a refill on your cardiac medications before your next appointment, please call your pharmacy*  Lab Work: None ordered today  Testing/Procedures: None ordered today  Follow-Up: On 03/25/20 at 2:15PM with Richardson Dopp, PA-C  Other Instructions Take Benadryl and use Hydrocortisone cream as needed for rash

## 2020-02-19 NOTE — H&P (View-Only) (Signed)
Cardiology Office Note:    Date:  02/19/2020   ID:  Gabriel Ramos, DOB 10-Jun-1950, MRN [id number]  PCP:  Hulan Fess, MD  [facility] Cardiologist:  Fransico Him, MD   [place] Electrophysiologist:  None   Referring MD: Hulan Fess, MD   Chief Complaint:  Follow-up (Leg swelling)    Patient Profile:    Gabriel Ramos is a 69 y.o. 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 Albumin 6/21: 2.7  Asthma, severe persistent  Depression  GERD  History of SVT in 2013  Myoview 2013 low risk  History of syncope in 2017 (normal event monitor)  Prior CV studies: Venous US 12/26/2019 No DVT bilaterally  Event monitor 10/03/2018 Sinus rhythm, average heart rate 79 (55-157); occasional PACs, PVCs  Echocardiogram 04/18/2016 Mild concentric LVH, EF 55, GR 1 DD, trace MR, trace PI, mild TR, normal RVSF  History of Present Illness:    Gabriel Ramos has recently been evaluated by GI (Dr. Loletha Carrow) for suspected cirrhosis.  He has had significant peripheral edema.  Evaluation has demonstrated alcoholic fatty liver disease but no evidence of cirrhosis.  EGD was negative for esophageal varices.  Despite high doses of diuretic therapy, his lower extremity swelling has been difficult to manage.  His albumin is low at 2.7.  I saw him last month for further evaluation and management.  I changed his furosemide to torsemide.  Echocardiogram is planned to assess LV and RV function.  If his EF is normal, I plan to obtain a Myoview.  Unfortunately, his echocardiogram was not scheduled until today.  He returns for follow-up.  He is here today with his girlfriend.  Since last seen, his swelling is notably improved as much.  His breathing is somewhat better.  He has not had chest pain.  He has not had syncope or orthopnea.  He does have a rash  that is pruritic.  This has been ongoing since he was started on diuretics.  He thinks it may have gotten worse since he was placed on torsemide.      Past Medical History:  Diagnosis Date  . Abnormal CXR 09/16/2016   Report 09/07/16 c/w granuloma  . Alcohol dependence with unspecified alcohol-induced disorder ([place])   . Alcoholic hepatitis without ascites   . ALLERGIC RHINITIS 09/14/2006   Qualifier: Diagnosis of  By: [name]    . Anxiety   . Asthma, chronic, severe persistent, uncomplicated 6/78/9381   Allergy profile 09/15/2016 >  Eos 0.5/  IgE  271 Grass > dog  - 09/15/2016    try symbicort 160 2bid  - 10/17/2016  After extensive coaching HFA effectiveness =    75%  (ti too short)  - sinus CT 03/02/2017 >  Pos polyp    . BPH (benign prostatic hyperplasia)   . Concentric left ventricular hypertrophy   . DEPRESSION 09/14/2006   Qualifier: Diagnosis of  By: [name]    . GERD (gastroesophageal reflux disease)   . Gout 03/28/2012  . Hepatitis C   . History of paroxysmal supraventricular tachycardia   . Hypertension   . Hypoalbuminemia   . Hypokalemia   . Nausea   . Opioid abuse ([place])   . Simple chronic bronchitis ([place])   . SVT (supraventricular tachycardia) ([place])    2013,echo Varanasi NI LV EF ,mild MR:  nl stress cl 3/13  . SVT (supraventricular tachycardia) ([place]) 08/12/2018   Cardiac work-up in 2013 normal  . Vertigo     Current Medications: Current Meds  Medication Sig  . albuterol (PROAIR HFA) 108 (90 Base) MCG/ACT inhaler Inhale 2 puffs into the lungs every 6 (six) hours as needed for wheezing or shortness of breath.  Marland Kitchen albuterol (PROVENTIL) (2.5 MG/3ML) 0.083% nebulizer solution Take 3 mLs (2.5 mg total) by nebulization every 6 (six) hours as needed for wheezing or shortness of breath.  . Buprenorphine HCl-Naloxone HCl 12-3 MG FILM in the morning and at bedtime.   Marland Kitchen FLUoxetine (PROZAC) 20 MG capsule Take 20 mg by mouth daily.  . fluticasone (FLONASE) 50 MCG/ACT nasal  spray Place 1 spray into both nostrils daily.  . fluticasone (FLOVENT HFA) 110 MCG/ACT inhaler Inhale into the lungs at bedtime.  . Nebulizers (NEBULIZER COMPRESSOR) MISC Use as directed Dx:  . ondansetron (ZOFRAN) 4 MG tablet Take 1 tablet (4 mg total) by mouth every 6 (six) hours.  . pantoprazole (PROTONIX) 40 MG tablet TAKE 1 TABLET (40 MG TOTAL) BY MOUTH DAILY. TAKE 30-60 MIN BEFORE FIRST MEAL OF THE DAY  . Spacer/Aero-Holding Chambers (AEROCHAMBER MV) inhaler Use as instructed  . spironolactone (ALDACTONE) 100 MG tablet 100 mg daily.   Marland Kitchen torsemide (DEMADEX) 20 MG tablet Take 1 tablet (20 mg total) by mouth daily.     Allergies:   Aspirin and Penicillins   Social History   Tobacco Use  . Smoking status: Never Smoker  . Smokeless tobacco: Never Used  Vaping Use  . Vaping Use: Never used  Substance Use Topics  . Alcohol use: Yes    Alcohol/week: 3.0 standard drinks    Types: 3 Shots of liquor per week    Comment: smirnoffs, BRANDY BOTTLE EVERY OTHER DAY  . Drug use: Yes    Frequency: 7.0 times per week    Types: Marijuana    Comment: this morning 08/27/19     Family Hx: The patient's family history includes Asthma in his brother; Heart disease in his brother and father; Heart murmur in his mother; Liver cancer in his brother. There is no history of Colon cancer, Esophageal cancer, Rectal cancer, or Stomach cancer.  ROS  See HPI  EKGs/Labs/Other Test Reviewed:    EKG:  EKG is not ordered today.  The ekg ordered today demonstrates n/a  Recent Labs: 01/17/2020: NT-Pro BNP 544 01/27/2020: ALT 20 01/30/2020: BUN 14; Creatinine, Ser 1.08; Potassium 3.8; Sodium 134; TSH 1.520   Recent Lipid Panel Lab Results  Component Value Date/Time   CHOL 156 10/24/2006 01:36 PM   TRIG 83 10/24/2006 01:36 PM   HDL 58.1 10/24/2006 01:36 PM   CHOLHDL 2.7 CALC 10/24/2006 01:36 PM   LDLCALC 81 10/24/2006 01:36 PM    Physical Exam:    VS:  BP 109/75   Pulse 95   Ht 5' 7.5" (1.715 m)    Wt 136 lb (61.7 kg)   SpO2 100%   BMI 20.99 kg/m     Wt Readings from Last 3 Encounters:  02/19/20 136 lb (61.7 kg)  01/17/20 136 lb 9.6 oz (62 kg)  01/13/20 138 lb (62.6 kg)     Constitutional:      Appearance: Healthy appearance. Not in distress.  Neck:     Vascular: JVD normal.  Pulmonary:     Effort: Pulmonary effort is normal.     Breath sounds: No wheezing. No rales.  Cardiovascular:  Normal rate. Regular rhythm. Normal S1. Normal S2.     Murmurs: There is no murmur.  Edema:    Pretibial: bilateral 2+ pitting edema of the pretibial area.    Ankle: bilateral 2+ pitting edema of the ankle.    Comments: Tight Abdominal:     Palpations: Abdomen is soft.  Skin:    General: Skin is warm and dry.     Comments: Scattered macular rash with excoriation about his arms legs back and abdomen  Neurological:     General: No focal deficit present.     Mental Status: Alert and oriented to person, place and time.     Cranial Nerves: Cranial nerves are intact.       ASSESSMENT & PLAN:    1. Leg swelling 2. Shortness of breath Etiology of his swelling is still not clear.  Question if he has right-sided heart failure.  Protein calorie malnutrition may be playing a role as well.  TSH was normal last time.  His echocardiogram was performed earlier today.  He has not really had significant improvement with torsemide.  I will continue his current dose of diuretics.  I will review with Dr. Radford Pax further after his echocardiogram returns.  It may be best to proceed with right and left heart catheterization.  We discussed risk and benefits of proceeding with cardiac catheterization today.  I will be in touch with him once I get his echocardiogram back and review with Dr. Radford Pax.  He did stop his potassium after he was told his potassium was normal.  I explained to him that he should remain on potassium supplementation along with torsemide.  He is having a hard time swallowing the pills.  I  will change him to elixir.  I will also have him come in for a urinalysis to rule out significant proteinuria.    3. Essential hypertension The patient's blood pressure is controlled on his current regimen.  Continue current therapy.   4. Rash His rash appears to be from dry skin.  It does not seem to be a drug eruption.  It appears that the itching has been making it worse.  I will refer him to dermatology.  I have asked him to use hydrocortisone cream over-the-counter as well as Benadryl until he can see the dermatologist.    Dispo:  Return in about 4 weeks (around 03/18/2020) for Routine Follow Up with Dr. Radford Pax, in person.   Medication Adjustments/Labs and Tests Ordered: Current medicines are reviewed at length with the patient today.  Concerns regarding medicines are outlined above.  Tests Ordered: Orders Placed This Encounter  Procedures  . Ambulatory referral to Dermatology   Medication Changes: Meds ordered this encounter  Medications  . potassium chloride 20 MEQ/15ML (10%) SOLN    Sig: Take 15 mLs (20 mEq total) by mouth daily.    Dispense:  450 mL    Refill:  [address] Richardson Dopp, [place]  02/19/2020 5:17 PM    [facility] [place], [place], [place]  [id number] Phone: [phone]; Fax: [phone]    ADDENDUM:   Echocardiogram 9/21:  EF 60-65, no RWMA, mild LVH, Gr 1 DD, normal RVSF, RVSP 23.1, trivial MR  Reviewed case with Dr. Radford Pax. Will proceed with Lexiscan Myoview to rule out ischemia as a cause of his shortness of breath. Will proceed with R sided cardiac catheterization to assess filling pressures and pulmonary pressures. Risks and benefits of R sided  heart cath d/w with patient in the office when seen and again today by phone today with his significant other ([place] on file) and the patient is willing to proceed.   Richardson Dopp, PA-C    02/27/2020 2:35 PM

## 2020-02-19 NOTE — Telephone Encounter (Signed)
Please have patient come in for a urinalysis to rule out proteinuria.   Dx: hypoalbuminemia.  Thanks Richardson Dopp, PA-C    02/19/2020 5:19 PM

## 2020-02-19 NOTE — Progress Notes (Addendum)
Cardiology Office Note:    Date:  02/19/2020   ID:  Gabriel Ramos, DOB 01-28-1951, MRN [id number]  PCP:  Hulan Fess, MD  [facility] Cardiologist:  Fransico Him, MD   [place] Electrophysiologist:  None   Referring MD: Hulan Fess, MD   Chief Complaint:  Follow-up (Leg swelling)    Patient Profile:    Gabriel Ramos is a 69 y.o. 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 Albumin 6/21: 2.7  Asthma, severe persistent  Depression  GERD  History of SVT in 2013  Myoview 2013 low risk  History of syncope in 2017 (normal event monitor)  Prior CV studies: Venous US 12/26/2019 No DVT bilaterally  Event monitor 10/03/2018 Sinus rhythm, average heart rate 79 (55-157); occasional PACs, PVCs  Echocardiogram 04/18/2016 Mild concentric LVH, EF 55, GR 1 DD, trace MR, trace PI, mild TR, normal RVSF  History of Present Illness:    [name] has recently been evaluated by GI (Dr. Loletha Carrow) for suspected cirrhosis.  He has had significant peripheral edema.  Evaluation has demonstrated alcoholic fatty liver disease but no evidence of cirrhosis.  EGD was negative for esophageal varices.  Despite high doses of diuretic therapy, his lower extremity swelling has been difficult to manage.  His albumin is low at 2.7.  I saw him last month for further evaluation and management.  I changed his furosemide to torsemide.  Echocardiogram is planned to assess LV and RV function.  If his EF is normal, I plan to obtain a Myoview.  Unfortunately, his echocardiogram was not scheduled until today.  He returns for follow-up.  He is here today with his girlfriend.  Since last seen, his swelling is notably improved as much.  His breathing is somewhat better.  He has not had chest pain.  He has not had syncope or orthopnea.  He does have a rash  that is pruritic.  This has been ongoing since he was started on diuretics.  He thinks it may have gotten worse since he was placed on torsemide.      Past Medical History:  Diagnosis Date  . Abnormal CXR 09/16/2016   Report 09/07/16 c/w granuloma  . Alcohol dependence with unspecified alcohol-induced disorder ([place])   . Alcoholic hepatitis without ascites   . ALLERGIC RHINITIS 09/14/2006   Qualifier: Diagnosis of  By: [name]    . Anxiety   . Asthma, chronic, severe persistent, uncomplicated 01/28/538   Allergy profile 09/15/2016 >  Eos 0.5/  IgE  271 Grass > dog  - 09/15/2016    try symbicort 160 2bid  - 10/17/2016  After extensive coaching HFA effectiveness =    75%  (ti too short)  - sinus CT 03/02/2017 >  Pos polyp    . BPH (benign prostatic hyperplasia)   . Concentric left ventricular hypertrophy   . DEPRESSION 09/14/2006   Qualifier: Diagnosis of  By: [name]    . GERD (gastroesophageal reflux disease)   . Gout 03/28/2012  . Hepatitis C   . History of paroxysmal supraventricular tachycardia   . Hypertension   . Hypoalbuminemia   . Hypokalemia   . Nausea   . Opioid abuse ([place])   . Simple chronic bronchitis ([place])   . SVT (supraventricular tachycardia) ([place])    2013,echo Varanasi NI LV EF ,mild MR:  nl stress cl 3/13  . SVT (supraventricular tachycardia) ([place]) 08/12/2018   Cardiac work-up in 2013 normal  . Vertigo     Current Medications: Current Meds  Medication Sig  . albuterol (PROAIR HFA) 108 (90 Base) MCG/ACT inhaler Inhale 2 puffs into the lungs every 6 (six) hours as needed for wheezing or shortness of breath.  Marland Kitchen albuterol (PROVENTIL) (2.5 MG/3ML) 0.083% nebulizer solution Take 3 mLs (2.5 mg total) by nebulization every 6 (six) hours as needed for wheezing or shortness of breath.  . Buprenorphine HCl-Naloxone HCl 12-3 MG FILM in the morning and at bedtime.   Marland Kitchen FLUoxetine (PROZAC) 20 MG capsule Take 20 mg by mouth daily.  . fluticasone (FLONASE) 50 MCG/ACT nasal  spray Place 1 spray into both nostrils daily.  . fluticasone (FLOVENT HFA) 110 MCG/ACT inhaler Inhale into the lungs at bedtime.  . Nebulizers (NEBULIZER COMPRESSOR) MISC Use as directed Dx:  . ondansetron (ZOFRAN) 4 MG tablet Take 1 tablet (4 mg total) by mouth every 6 (six) hours.  . pantoprazole (PROTONIX) 40 MG tablet TAKE 1 TABLET (40 MG TOTAL) BY MOUTH DAILY. TAKE 30-60 MIN BEFORE FIRST MEAL OF THE DAY  . Spacer/Aero-Holding Chambers (AEROCHAMBER MV) inhaler Use as instructed  . spironolactone (ALDACTONE) 100 MG tablet 100 mg daily.   Marland Kitchen torsemide (DEMADEX) 20 MG tablet Take 1 tablet (20 mg total) by mouth daily.     Allergies:   Aspirin and Penicillins   Social History   Tobacco Use  . Smoking status: Never Smoker  . Smokeless tobacco: Never Used  Vaping Use  . Vaping Use: Never used  Substance Use Topics  . Alcohol use: Yes    Alcohol/week: 3.0 standard drinks    Types: 3 Shots of liquor per week    Comment: smirnoffs, BRANDY BOTTLE EVERY OTHER DAY  . Drug use: Yes    Frequency: 7.0 times per week    Types: Marijuana    Comment: this morning 08/27/19     Family Hx: The patient's family history includes Asthma in his brother; Heart disease in his brother and father; Heart murmur in his mother; Liver cancer in his brother. There is no history of Colon cancer, Esophageal cancer, Rectal cancer, or Stomach cancer.  ROS  See HPI  EKGs/Labs/Other Test Reviewed:    EKG:  EKG is not ordered today.  The ekg ordered today demonstrates n/a  Recent Labs: 01/17/2020: NT-Pro BNP 544 01/27/2020: ALT 20 01/30/2020: BUN 14; Creatinine, Ser 1.08; Potassium 3.8; Sodium 134; TSH 1.520   Recent Lipid Panel Lab Results  Component Value Date/Time   CHOL 156 10/24/2006 01:36 PM   TRIG 83 10/24/2006 01:36 PM   HDL 58.1 10/24/2006 01:36 PM   CHOLHDL 2.7 CALC 10/24/2006 01:36 PM   LDLCALC 81 10/24/2006 01:36 PM    Physical Exam:    VS:  BP 109/75   Pulse 95   Ht 5' 7.5" (1.715 m)    Wt 136 lb (61.7 kg)   SpO2 100%   BMI 20.99 kg/m     Wt Readings from Last 3 Encounters:  02/19/20 136 lb (61.7 kg)  01/17/20 136 lb 9.6 oz (62 kg)  01/13/20 138 lb (62.6 kg)     Constitutional:      Appearance: Healthy appearance. Not in distress.  Neck:     Vascular: JVD normal.  Pulmonary:     Effort: Pulmonary effort is normal.     Breath sounds: No wheezing. No rales.  Cardiovascular:  Normal rate. Regular rhythm. Normal S1. Normal S2.     Murmurs: There is no murmur.  Edema:    Pretibial: bilateral 2+ pitting edema of the pretibial area.    Ankle: bilateral 2+ pitting edema of the ankle.    Comments: Tight Abdominal:     Palpations: Abdomen is soft.  Skin:    General: Skin is warm and dry.     Comments: Scattered macular rash with excoriation about his arms legs back and abdomen  Neurological:     General: No focal deficit present.     Mental Status: Alert and oriented to person, place and time.     Cranial Nerves: Cranial nerves are intact.       ASSESSMENT & PLAN:    1. Leg swelling 2. Shortness of breath Etiology of his swelling is still not clear.  Question if he has right-sided heart failure.  Protein calorie malnutrition may be playing a role as well.  TSH was normal last time.  His echocardiogram was performed earlier today.  He has not really had significant improvement with torsemide.  I will continue his current dose of diuretics.  I will review with Dr. Radford Pax further after his echocardiogram returns.  It may be best to proceed with right and left heart catheterization.  We discussed risk and benefits of proceeding with cardiac catheterization today.  I will be in touch with him once I get his echocardiogram back and review with Dr. Radford Pax.  He did stop his potassium after he was told his potassium was normal.  I explained to him that he should remain on potassium supplementation along with torsemide.  He is having a hard time swallowing the pills.  I  will change him to elixir.  I will also have him come in for a urinalysis to rule out significant proteinuria.    3. Essential hypertension The patient's blood pressure is controlled on his current regimen.  Continue current therapy.   4. Rash His rash appears to be from dry skin.  It does not seem to be a drug eruption.  It appears that the itching has been making it worse.  I will refer him to dermatology.  I have asked him to use hydrocortisone cream over-the-counter as well as Benadryl until he can see the dermatologist.    Dispo:  Return in about 4 weeks (around 03/18/2020) for Routine Follow Up with Dr. Radford Pax, in person.   Medication Adjustments/Labs and Tests Ordered: Current medicines are reviewed at length with the patient today.  Concerns regarding medicines are outlined above.  Tests Ordered: Orders Placed This Encounter  Procedures  . Ambulatory referral to Dermatology   Medication Changes: Meds ordered this encounter  Medications  . potassium chloride 20 MEQ/15ML (10%) SOLN    Sig: Take 15 mLs (20 mEq total) by mouth daily.    Dispense:  450 mL    Refill:  [address] Richardson Dopp, [place]  02/19/2020 5:17 PM    [facility] [place], [employer], [place]  [address] Phone: [phone]; Fax: [phone]    ADDENDUM:   Echocardiogram 9/21:  EF 60-65, no RWMA, mild LVH, Gr 1 DD, normal RVSF, RVSP 23.1, trivial MR  Reviewed case with Dr. Radford Pax. Will proceed with Lexiscan Myoview to rule out ischemia as a cause of his shortness of breath. Will proceed with R sided cardiac catheterization to assess filling pressures and pulmonary pressures. Risks and benefits of R sided  heart cath d/w with patient in the office when seen and again today by phone today with his significant other ([place] on file) and the patient is willing to proceed.   Richardson Dopp, PA-C    02/27/2020 2:35 PM

## 2020-02-21 DIAGNOSIS — J45909 Unspecified asthma, uncomplicated: Secondary | ICD-10-CM | POA: Diagnosis not present

## 2020-02-21 DIAGNOSIS — I10 Essential (primary) hypertension: Secondary | ICD-10-CM | POA: Diagnosis not present

## 2020-02-21 DIAGNOSIS — F329 Major depressive disorder, single episode, unspecified: Secondary | ICD-10-CM | POA: Diagnosis not present

## 2020-02-21 DIAGNOSIS — N400 Enlarged prostate without lower urinary tract symptoms: Secondary | ICD-10-CM | POA: Diagnosis not present

## 2020-02-21 DIAGNOSIS — J4550 Severe persistent asthma, uncomplicated: Secondary | ICD-10-CM | POA: Diagnosis not present

## 2020-02-21 DIAGNOSIS — J45901 Unspecified asthma with (acute) exacerbation: Secondary | ICD-10-CM | POA: Diagnosis not present

## 2020-02-21 DIAGNOSIS — J410 Simple chronic bronchitis: Secondary | ICD-10-CM | POA: Diagnosis not present

## 2020-02-21 NOTE — Telephone Encounter (Signed)
I called and left Patty a message to call back and set up urinalysis.

## 2020-02-21 NOTE — Telephone Encounter (Signed)
Gabriel Ramos returned my call, patient will come in on 02/25/20 for urinalysis. Orders in.

## 2020-02-24 ENCOUNTER — Encounter: Payer: Self-pay | Admitting: Physician Assistant

## 2020-02-25 ENCOUNTER — Ambulatory Visit (INDEPENDENT_AMBULATORY_CARE_PROVIDER_SITE_OTHER): Payer: Medicare HMO | Admitting: Internal Medicine

## 2020-02-25 ENCOUNTER — Other Ambulatory Visit: Payer: Medicare HMO

## 2020-02-25 ENCOUNTER — Other Ambulatory Visit: Payer: Self-pay

## 2020-02-25 ENCOUNTER — Encounter: Payer: Self-pay | Admitting: Internal Medicine

## 2020-02-25 VITALS — BP 102/64 | HR 98 | Temp 97.3°F | Ht 69.0 in | Wt 137.4 lb

## 2020-02-25 DIAGNOSIS — J4540 Moderate persistent asthma, uncomplicated: Secondary | ICD-10-CM | POA: Diagnosis not present

## 2020-02-25 DIAGNOSIS — Z23 Encounter for immunization: Secondary | ICD-10-CM | POA: Diagnosis not present

## 2020-02-25 MED ORDER — FLUTICASONE PROPIONATE 50 MCG/ACT NA SUSP: 1.0000 | Freq: Every day | NASAL | 11 refills | Status: AC

## 2020-02-25 MED ORDER — ALBUTEROL SULFATE HFA 108 (90 BASE) MCG/ACT IN AERS: 2.0000 | INHALATION_SPRAY | Freq: Four times a day (QID) | RESPIRATORY_TRACT | 5 refills | Status: AC | PRN

## 2020-02-25 MED ORDER — FLUTICASONE PROPIONATE HFA 110 MCG/ACT IN AERO: 2.0000 | INHALATION_SPRAY | Freq: Every evening | RESPIRATORY_TRACT | 11 refills | Status: AC

## 2020-02-25 NOTE — Progress Notes (Signed)
Gabriel Ramos    [id number]    May 11, 1951  Primary Care Physician:Little, Lennette Bihari, MD Date of Appointment: 02/25/2020 Established Patient Visit  Chief complaint:   Chief Complaint  Patient presents with   Follow-up    shortness of breath with activity     HPI: Gabriel Ramos is a 69 y.o. with asthma who presents for follow up.   Interval Updates:  Stopped advair because of sore throat. Doesn't do well with DPI. Was gargling after use with mouthwash and water. Took three days of prednisone as a burst but no hospitalizations or ED visits. Not on long term prednisone any longer.  Completed Hep C treatment.  Current Regimen: uses albuterol MDI only 1-2 times/day.  Asthma Triggers: dust, cigarette smoke, mold Exacerbations in the last year: 2. History of hospitalization or intubation: Yes, had the flu once, never intubated.  Hives/Polyps: yes has had three polypectomies in the 1990s Allergy Testing: yes - in the 1970s. Had SCIT at that time.  GERD: yes, takes Allergic Rhinitis: ACT:  Asthma Control Test ACT Total Score  02/25/2020 14  10/28/2019 12  08/27/2019 11    I have reviewed the patient's family social and past medical history and updated as appropriate.   Past Medical History:  Diagnosis Date   Abnormal CXR 09/16/2016   Report 09/07/16 c/w granuloma   Alcohol dependence with unspecified alcohol-induced disorder ([place])    Alcoholic hepatitis without ascites    ALLERGIC RHINITIS 09/14/2006   Qualifier: Diagnosis of  By: [name]     Anxiety    Asthma, chronic, severe persistent, uncomplicated 4/43/1540   Allergy profile 09/15/2016 >  Eos 0.5/  IgE  271 Grass > dog  - 09/15/2016    try symbicort 160 2bid  - 10/17/2016  After extensive coaching HFA effectiveness =    75%  (ti too short)  - sinus CT 03/02/2017 >  Pos polyp     BPH (benign prostatic hyperplasia)    Concentric left ventricular hypertrophy    DEPRESSION 09/14/2006   Qualifier:  Diagnosis of  By: [name]     Echocardiogram    Echocardiogram 9/21: EF 60-65, no RWMA, mild LVH, Gr 1 DD, normal RVSF, RVSP 23.1, trivial MR   GERD (gastroesophageal reflux disease)    Gout 03/28/2012   Hepatitis C    History of paroxysmal supraventricular tachycardia    Hypertension    Hypoalbuminemia    Hypokalemia    Nausea    Opioid abuse ([facility])    Simple chronic bronchitis ([facility])    SVT (supraventricular tachycardia) ([name])    2013,echo Varanasi NI LV EF ,mild MR: nl stress cl 3/13   SVT (supraventricular tachycardia) ([name]) 08/12/2018   Cardiac work-up in 2013 normal   Vertigo     Past Surgical History:  Procedure Laterality Date   polyps remvoed from nose     UPPER GI ENDOSCOPY     approx 10 years    Family History  Problem Relation Age of Onset   Heart murmur Mother    Heart disease Father    Heart disease Brother    Asthma Brother    Liver cancer Brother        Stage 4    Colon cancer Neg Hx    Esophageal cancer Neg Hx    Rectal cancer Neg Hx    Stomach cancer Neg Hx     Social History   Occupational History   Occupation: retired  Tobacco Use   Smoking status: Never Smoker   Smokeless tobacco: Never Used  Vaping Use   Vaping Use: Never used  Substance and Sexual Activity   Alcohol use: Yes    Alcohol/week: 3.0 standard drinks    Types: 3 Shots of liquor per week    Comment: smirnoffs, BRANDY BOTTLE EVERY OTHER DAY   Drug use: Yes    Frequency: 7.0 times per week    Types: Marijuana    Comment: this morning 08/27/19   Sexual activity: Yes    Birth control/protection: None     Physical Exam: Blood pressure 102/64, pulse 98, temperature (!) 97.3 F (36.3 C), temperature source Other (Comment), height 5\' 9"  (1.753 m), weight 137 lb 6.4 oz (62.3 kg), SpO2 100 %.  Gen:      No acute distress Lungs:    No increased respiratory effort, symmetric chest wall excursion, clear to auscultation bilaterally, no wheezes or  crackles CV:         Tachycardic, regular, no murmurs, rubs, or gallops.  No pedal edema  Data Reviewed: Imaging: I have personally reviewed the chest xray May 2018 hyperinflation  PFTs: Patient was unable to complete PFTs. In April 2020. Best attempt suggested moderate airflow limitation.  Labs: Lab Results  Component Value Date   WBC 16.7 (H) 10/17/2016   HGB 13.5 10/17/2016   HCT 39.3 10/17/2016   MCV 97.9 10/17/2016   PLT 314.0 10/17/2016   Lab Results  Component Value Date   NA 134 01/30/2020   K 3.8 01/30/2020   CL 92 (L) 01/30/2020   CO2 27 01/30/2020    Immunization status: Immunization History  Administered Date(s) Administered   Fluad Quad(high Dose 65+) 02/25/2020   Influenza Split 08/04/2016   Influenza, High Dose Seasonal PF 04/30/2019   Td 10/16/2006    Assessment:  Moderate Persistent asthma, improved control Allergic rhinitis, improved control  Plan/Recommendations: continue flovent HFA with spacer.  Will refill. Rhinitis improved with flonase. Continue this. Will refill Flu shot today - high dose.  Return to Care: Return in about 4 months (around 06/26/2020).   Gabriel Llamas, MD Pulmonary and [place]

## 2020-02-25 NOTE — Patient Instructions (Signed)
The patient should have follow up scheduled with myself in 4 months.     

## 2020-02-27 ENCOUNTER — Telehealth: Payer: Self-pay

## 2020-02-27 ENCOUNTER — Encounter: Payer: Self-pay | Admitting: Physician Assistant

## 2020-02-27 DIAGNOSIS — R0602 Shortness of breath: Secondary | ICD-10-CM

## 2020-02-27 NOTE — Telephone Encounter (Signed)
-----   Message from Liliane Shi, [place] sent at 02/24/2020  9:33 AM EDT ----- Please call the patient. The echocardiogram shows normal heart function.  The right heart also functions normally.  There is mild stiffness during relaxation.  The lung pressures are normal. There are no valve issues.   I discussed with Dr. Radford Pax.  We think he needs a R heart catheterization and a stress test. PLAN:   - Schedule a Lexiscan Myoview (Dx: anginal equivalent - shortness of breath)  - Schedule a R sided cardiac catheterization - get a good phone number to contact him and I will call to go over the procedure with him (we discussed at last visit but I can d/w him again)  - Send copy to PCP Richardson Dopp, PA-C    02/24/2020 9:23 AM

## 2020-02-27 NOTE — Telephone Encounter (Signed)
The patients wife Gabriel Ramos has been notified of the result and verbalized understanding.  All questions (if any) were answered. Orders in for [place] to be scheduled. Right heart catheterization will be scheduled after Lexiscan. Mady Haagensen, [place] 02/27/2020 12:51 PM

## 2020-03-02 ENCOUNTER — Ambulatory Visit: Payer: Medicare HMO | Admitting: Internal Medicine

## 2020-03-02 ENCOUNTER — Telehealth (HOSPITAL_COMMUNITY): Payer: Self-pay

## 2020-03-02 NOTE — Telephone Encounter (Signed)
Spoke with the patient's wife. She stated that she understood the instructions and give them to him. Asked to call back with any questions. S.Gaelyn Tukes EMTP

## 2020-03-03 ENCOUNTER — Encounter: Payer: Self-pay | Admitting: Physician Assistant

## 2020-03-03 ENCOUNTER — Ambulatory Visit (HOSPITAL_COMMUNITY): Payer: Medicare HMO | Attending: Cardiovascular Disease

## 2020-03-03 ENCOUNTER — Other Ambulatory Visit: Payer: Self-pay

## 2020-03-03 DIAGNOSIS — R0602 Shortness of breath: Secondary | ICD-10-CM | POA: Insufficient documentation

## 2020-03-03 LAB — MYOCARDIAL PERFUSION IMAGING
LV dias vol: 32 mL (ref 62–150)
LV sys vol: 5 mL
Peak HR: 106 {beats}/min
Rest HR: 100 {beats}/min
SDS: 1
SRS: 0
SSS: 1
TID: 0.85

## 2020-03-03 MED ORDER — TECHNETIUM TC 99M TETROFOSMIN IV KIT
10.3000 | PACK | Freq: Once | INTRAVENOUS | Status: AC | PRN
  Administered 2020-03-03: 10.3 via INTRAVENOUS
  Filled 2020-03-03: qty 11

## 2020-03-03 MED ORDER — REGADENOSON 0.4 MG/5ML IV SOLN
0.4000 mg | Freq: Once | INTRAVENOUS | Status: AC
  Administered 2020-03-03: 0.4 mg via INTRAVENOUS

## 2020-03-03 MED ORDER — TECHNETIUM TC 99M TETROFOSMIN IV KIT
32.8000 | PACK | Freq: Once | INTRAVENOUS | Status: AC | PRN
  Administered 2020-03-03: 32.8 via INTRAVENOUS
  Filled 2020-03-03: qty 33

## 2020-03-04 ENCOUNTER — Telehealth: Payer: Self-pay

## 2020-03-04 DIAGNOSIS — R21 Rash and other nonspecific skin eruption: Secondary | ICD-10-CM | POA: Diagnosis not present

## 2020-03-04 DIAGNOSIS — Z01812 Encounter for preprocedural laboratory examination: Secondary | ICD-10-CM

## 2020-03-04 NOTE — Telephone Encounter (Signed)
I called and spoke with patients wife Chong Sicilian, ok per patient DPR. After speaking with Nicki Reaper, I explained to Patty why patient needs a right heart catheterization. Patty verbalized understanding, she will speak to patient and explain that he needs a right heart catheterization due to stress test being normal with no signs of a blockage and a right heart catheterization will show why patient is swelling and has shortness of breath. Patty will call back to set up right heart catheterization. Patient will also need to come by the office for EKG, CBC, and BMET.

## 2020-03-04 NOTE — Telephone Encounter (Signed)
-----   Message from Liliane Shi, [place] sent at 03/03/2020  5:30 PM EDT ----- Please call the patient. The stress test is normal PLAN:   - Continue current medications/treatment plan and follow up as scheduled.  - Please schedule right heart catheterization as previously discussed.  - Send Copy to PCP Richardson Dopp, PA-C    03/03/2020 5:28 PM

## 2020-03-05 DIAGNOSIS — I10 Essential (primary) hypertension: Secondary | ICD-10-CM | POA: Diagnosis not present

## 2020-03-05 DIAGNOSIS — F329 Major depressive disorder, single episode, unspecified: Secondary | ICD-10-CM | POA: Diagnosis not present

## 2020-03-05 DIAGNOSIS — J410 Simple chronic bronchitis: Secondary | ICD-10-CM | POA: Diagnosis not present

## 2020-03-05 DIAGNOSIS — N400 Enlarged prostate without lower urinary tract symptoms: Secondary | ICD-10-CM | POA: Diagnosis not present

## 2020-03-05 DIAGNOSIS — J45901 Unspecified asthma with (acute) exacerbation: Secondary | ICD-10-CM | POA: Diagnosis not present

## 2020-03-05 DIAGNOSIS — J4550 Severe persistent asthma, uncomplicated: Secondary | ICD-10-CM | POA: Diagnosis not present

## 2020-03-06 NOTE — Addendum Note (Signed)
Addended byKathlen Mody, Nicki Reaper T on: 03/06/2020 01:45 PM   Modules accepted: Orders, SmartSet

## 2020-03-06 NOTE — Telephone Encounter (Signed)
Chong Sicilian, wife of the patient called back returning Emily's call. She wanted to confirm what medications the patient needs to take the morning of the procedure. She thought she heard her say that the patient needs to take an Aspirin but the patient is allergic to Aspirin.  Please clarify instructions with the wife

## 2020-03-06 NOTE — Telephone Encounter (Signed)
Orders written. Richardson Dopp, PA-C    03/06/2020 1:45 PM

## 2020-03-06 NOTE — Telephone Encounter (Signed)
I called and spoke with Gabriel Ramos, she is aware that patients EKG, lab work, and COVID test are scheduled for 03/11/20, patient will come here on 03/11/20 at 11AM for EKG and labs. COVID test at 12:30PM. Patient is scheduled for right heart catheterization on 03/13/20 at 7:30AM. COVID site testing map and instructions for catheterization printed for patient. I have reviewed these instructions and directions with Gabriel Ramos and will go over these instructions with patient on 03/11/20. Gabriel Ramos verbalized understanding and thanked me for the call.

## 2020-03-09 DIAGNOSIS — B86 Scabies: Secondary | ICD-10-CM | POA: Diagnosis not present

## 2020-03-11 ENCOUNTER — Ambulatory Visit (INDEPENDENT_AMBULATORY_CARE_PROVIDER_SITE_OTHER): Payer: Medicare HMO

## 2020-03-11 ENCOUNTER — Other Ambulatory Visit (HOSPITAL_COMMUNITY)
Admission: RE | Admit: 2020-03-11 | Discharge: 2020-03-11 | Disposition: A | Discharge status: Home / Self Care | Payer: Medicare HMO | Source: Ambulatory Visit | Attending: Internal Medicine | Admitting: Internal Medicine

## 2020-03-11 ENCOUNTER — Other Ambulatory Visit: Payer: Medicare HMO | Admitting: *Deleted

## 2020-03-11 ENCOUNTER — Other Ambulatory Visit: Payer: Self-pay

## 2020-03-11 VITALS — BP 118/64 | HR 90 | Ht 70.0 in | Wt 132.0 lb

## 2020-03-11 DIAGNOSIS — R0602 Shortness of breath: Secondary | ICD-10-CM

## 2020-03-11 DIAGNOSIS — I10 Essential (primary) hypertension: Secondary | ICD-10-CM | POA: Diagnosis not present

## 2020-03-11 DIAGNOSIS — Z01818 Encounter for other preprocedural examination: Secondary | ICD-10-CM | POA: Diagnosis not present

## 2020-03-11 DIAGNOSIS — Z01812 Encounter for preprocedural laboratory examination: Secondary | ICD-10-CM

## 2020-03-11 DIAGNOSIS — M7989 Other specified soft tissue disorders: Secondary | ICD-10-CM | POA: Diagnosis not present

## 2020-03-11 DIAGNOSIS — R21 Rash and other nonspecific skin eruption: Secondary | ICD-10-CM | POA: Diagnosis not present

## 2020-03-11 DIAGNOSIS — Z20822 Contact with and (suspected) exposure to covid-19: Secondary | ICD-10-CM | POA: Diagnosis not present

## 2020-03-11 DIAGNOSIS — K7030 Alcoholic cirrhosis of liver without ascites: Secondary | ICD-10-CM | POA: Diagnosis not present

## 2020-03-11 LAB — BASIC METABOLIC PANEL
BUN/Creatinine Ratio: 17 (ref 10–24)
BUN: 14 mg/dL (ref 8–27)
CO2: 30 mmol/L — ABNORMAL HIGH (ref 20–29)
Calcium: 8 mg/dL — ABNORMAL LOW (ref 8.6–10.2)
Chloride: 100 mmol/L (ref 96–106)
Creatinine, Ser: 0.83 mg/dL (ref 0.76–1.27)
GFR calc Af Amer: 105 mL/min/{1.73_m2} (ref >59)
GFR calc non Af Amer: 91 mL/min/{1.73_m2} (ref >59)
Glucose: 144 mg/dL — ABNORMAL HIGH (ref 65–99)
Potassium: 4.7 mmol/L (ref 3.5–5.2)
Sodium: 139 mmol/L (ref 134–144)

## 2020-03-11 LAB — CBC
Hematocrit: 28.5 % — ABNORMAL LOW (ref 37.5–51.0)
Hemoglobin: 9.9 g/dL — ABNORMAL LOW (ref 13.0–17.7)
MCH: 36.4 pg — ABNORMAL HIGH (ref 26.6–33.0)
MCHC: 34.7 g/dL (ref 31.5–35.7)
MCV: 105 fL — ABNORMAL HIGH (ref 79–97)
Platelets: 468 10*3/uL — ABNORMAL HIGH (ref 150–450)
RBC: 2.72 x10E6/uL — CL (ref 4.14–5.80)
RDW: 13.4 % (ref 11.6–15.4)
WBC: 11.4 10*3/uL — ABNORMAL HIGH (ref 3.4–10.8)

## 2020-03-11 LAB — SARS CORONAVIRUS 2 (TAT 6-24 HRS): SARS Coronavirus 2: NEGATIVE

## 2020-03-11 NOTE — Progress Notes (Signed)
1.) Reason for visit: EKG  2.) Name of MD requesting visit: Richardson Dopp, PA  3.) H&P: SOB  4.) Assessment and plan per MD: EKG performed and reviewed by Richardson Dopp, PA. Will continue with plan for RHC on 10/8.

## 2020-03-12 ENCOUNTER — Telehealth: Payer: Self-pay | Admitting: Cardiology

## 2020-03-12 ENCOUNTER — Telehealth: Payer: Self-pay | Admitting: *Deleted

## 2020-03-12 MED ORDER — SODIUM CHLORIDE 0.9 % IV SOLN: INTRAVENOUS | Status: DC

## 2020-03-12 NOTE — Telephone Encounter (Signed)
Pt contacted pre-catheterization scheduled at [facility] for: Friday March 13, 2020 7:30 AM Verified arrival time and place: [facility]) at: 5:30 AM   No solid food after midnight prior to cath, clear liquids until 5 AM day of procedure.   Hold: Torsemide/Spironolactone/KCl-AM of procedure  Except hold medications AM meds can be  taken pre-cath with sips of water.  Confirmed patient has responsible adult to drive home post procedure and be with patient first 24 hours after arriving home: yes  You are allowed ONE visitor in the waiting room during the time you are at the hospital for your procedure. Both you and your visitor must wear a mask once you enter the hospital.       COVID-19 Pre-Screening Questions:   In the past 14 days have you had a new cough, new headache, new nasal congestion, fever (100.4 or greater) unexplained body aches, new sore throat, or sudden loss of taste or sense of smell? no  In the past 14 days have you been around anyone with known Covid 19?  no  Have you been vaccinated for COVID-19? Yes, see immunization history  Reviewed procedure/mask/visitor instructions, COVID-19 questions with fiance (DPR), Betty.

## 2020-03-12 NOTE — Telephone Encounter (Signed)
Call placed to fiance (DPR), Betty-see other phone note 03/12/20.

## 2020-03-12 NOTE — Telephone Encounter (Signed)
Fiance of the patient needed clarification of instructions for upcoming procedure tomorrow

## 2020-03-13 ENCOUNTER — Encounter (HOSPITAL_COMMUNITY)
Admission: RE | Disposition: A | Discharge status: Home / Self Care | Payer: Self-pay | Source: Home / Self Care | Attending: Internal Medicine

## 2020-03-13 ENCOUNTER — Encounter (HOSPITAL_COMMUNITY): Payer: Self-pay | Admitting: Internal Medicine

## 2020-03-13 ENCOUNTER — Ambulatory Visit (HOSPITAL_COMMUNITY)
Admission: RE | Admit: 2020-03-13 | Discharge: 2020-03-13 | Disposition: A | Discharge status: Home / Self Care | Payer: Medicare HMO | Attending: Internal Medicine | Admitting: Internal Medicine

## 2020-03-13 ENCOUNTER — Other Ambulatory Visit: Payer: Self-pay

## 2020-03-13 DIAGNOSIS — M7989 Other specified soft tissue disorders: Secondary | ICD-10-CM | POA: Diagnosis not present

## 2020-03-13 DIAGNOSIS — F32A Depression, unspecified: Secondary | ICD-10-CM | POA: Diagnosis not present

## 2020-03-13 DIAGNOSIS — F419 Anxiety disorder, unspecified: Secondary | ICD-10-CM | POA: Insufficient documentation

## 2020-03-13 DIAGNOSIS — I10 Essential (primary) hypertension: Secondary | ICD-10-CM | POA: Diagnosis not present

## 2020-03-13 DIAGNOSIS — N400 Enlarged prostate without lower urinary tract symptoms: Secondary | ICD-10-CM | POA: Insufficient documentation

## 2020-03-13 DIAGNOSIS — M109 Gout, unspecified: Secondary | ICD-10-CM | POA: Diagnosis not present

## 2020-03-13 DIAGNOSIS — Z880 Allergy status to penicillin: Secondary | ICD-10-CM | POA: Diagnosis not present

## 2020-03-13 DIAGNOSIS — R609 Edema, unspecified: Secondary | ICD-10-CM | POA: Diagnosis not present

## 2020-03-13 DIAGNOSIS — Z7951 Long term (current) use of inhaled steroids: Secondary | ICD-10-CM | POA: Insufficient documentation

## 2020-03-13 DIAGNOSIS — Z8249 Family history of ischemic heart disease and other diseases of the circulatory system: Secondary | ICD-10-CM | POA: Diagnosis not present

## 2020-03-13 DIAGNOSIS — K219 Gastro-esophageal reflux disease without esophagitis: Secondary | ICD-10-CM | POA: Insufficient documentation

## 2020-03-13 DIAGNOSIS — Z6820 Body mass index (BMI) 20.0-20.9, adult: Secondary | ICD-10-CM | POA: Diagnosis not present

## 2020-03-13 DIAGNOSIS — Z79899 Other long term (current) drug therapy: Secondary | ICD-10-CM | POA: Diagnosis not present

## 2020-03-13 DIAGNOSIS — J45909 Unspecified asthma, uncomplicated: Secondary | ICD-10-CM | POA: Diagnosis not present

## 2020-03-13 DIAGNOSIS — R0602 Shortness of breath: Secondary | ICD-10-CM | POA: Diagnosis not present

## 2020-03-13 DIAGNOSIS — B1920 Unspecified viral hepatitis C without hepatic coma: Secondary | ICD-10-CM | POA: Insufficient documentation

## 2020-03-13 DIAGNOSIS — Z886 Allergy status to analgesic agent status: Secondary | ICD-10-CM | POA: Diagnosis not present

## 2020-03-13 DIAGNOSIS — E46 Unspecified protein-calorie malnutrition: Secondary | ICD-10-CM | POA: Insufficient documentation

## 2020-03-13 DIAGNOSIS — R600 Localized edema: Secondary | ICD-10-CM | POA: Diagnosis not present

## 2020-03-13 HISTORY — PX: RIGHT HEART CATH: CATH118263

## 2020-03-13 LAB — POCT I-STAT EG7
Acid-Base Excess: 5 mmol/L — ABNORMAL HIGH (ref 0.0–2.0)
Bicarbonate: 29.4 mmol/L — ABNORMAL HIGH (ref 20.0–28.0)
Calcium, Ion: 1.08 mmol/L — ABNORMAL LOW (ref 1.15–1.40)
HCT: 28 % — ABNORMAL LOW (ref 39.0–52.0)
Hemoglobin: 9.5 g/dL — ABNORMAL LOW (ref 13.0–17.0)
O2 Saturation: 77 %
Potassium: 4.3 mmol/L (ref 3.5–5.1)
Sodium: 139 mmol/L (ref 135–145)
TCO2: 31 mmol/L (ref 22–32)
pCO2, Ven: 43.8 mmHg — ABNORMAL LOW (ref 44.0–60.0)
pH, Ven: 7.436 — ABNORMAL HIGH (ref 7.250–7.430)
pO2, Ven: 41 mmHg (ref 32.0–45.0)

## 2020-03-13 SURGERY — RIGHT HEART CATH

## 2020-03-13 MED ORDER — LABETALOL HCL 5 MG/ML IV SOLN: 10.0000 mg | INTRAVENOUS | Status: DC | PRN

## 2020-03-13 MED ORDER — SODIUM CHLORIDE 0.9% FLUSH: 3.0000 mL | INTRAVENOUS | Status: DC | PRN

## 2020-03-13 MED ORDER — HEPARIN (PORCINE) IN NACL 1000-0.9 UT/500ML-% IV SOLN
INTRAVENOUS | Status: AC
  Filled 2020-03-13: qty 1000

## 2020-03-13 MED ORDER — HYDRALAZINE HCL 20 MG/ML IJ SOLN: 10.0000 mg | INTRAMUSCULAR | Status: DC | PRN

## 2020-03-13 MED ORDER — SODIUM CHLORIDE 0.9 % IV SOLN: 250.0000 mL | INTRAVENOUS | Status: DC | PRN

## 2020-03-13 MED ORDER — ONDANSETRON HCL 4 MG/2ML IJ SOLN
4.0000 mg | Freq: Four times a day (QID) | INTRAMUSCULAR | Status: DC | PRN
  Administered 2020-03-13: 4 mg via INTRAVENOUS
  Filled 2020-03-13: qty 2

## 2020-03-13 MED ORDER — LIDOCAINE HCL (PF) 1 % IJ SOLN
INTRAMUSCULAR | Status: AC
  Filled 2020-03-13: qty 30

## 2020-03-13 MED ORDER — ACETAMINOPHEN 325 MG PO TABS: 650.0000 mg | ORAL_TABLET | ORAL | Status: DC | PRN

## 2020-03-13 MED ORDER — LIDOCAINE HCL (PF) 1 % IJ SOLN
INTRAMUSCULAR | Status: DC | PRN
  Administered 2020-03-13: 2 mL

## 2020-03-13 MED ORDER — SODIUM CHLORIDE 0.9% FLUSH: 3.0000 mL | Freq: Two times a day (BID) | INTRAVENOUS | Status: DC

## 2020-03-13 MED ORDER — HEPARIN (PORCINE) IN NACL 1000-0.9 UT/500ML-% IV SOLN
INTRAVENOUS | Status: DC | PRN
  Administered 2020-03-13 (×2): 500 mL

## 2020-03-13 SURGICAL SUPPLY — 7 items
CATH SWAN GANZ 7F STRAIGHT (CATHETERS) ×2 IMPLANT
CATH SWAN GANZ VIP 7.5F (CATHETERS) ×1 IMPLANT
GLIDESHEATH SLENDER 7FR .021G (SHEATH) ×2 IMPLANT
GUIDEWIRE .025 260CM (WIRE) ×2 IMPLANT
KIT HEART LEFT (KITS) ×2 IMPLANT
PACK CARDIAC CATHETERIZATION (CUSTOM PROCEDURE TRAY) ×1 IMPLANT
TRANSDUCER W/STOPCOCK (MISCELLANEOUS) ×2 IMPLANT

## 2020-03-13 NOTE — Discharge Instructions (Signed)
Venogram A venogram, or venography, is a procedure that uses an X-ray and dye (contrast) to examine how well the veins work and how blood flows through them. Contrast helps the veins show up on X-rays. A venogram may be done:  To evaluate abnormalities in the vein.  To identify clots within veins, such as deep vein thrombosis (DVT).  To map out the veins that might be needed for another procedure. Tell a health care provider about:  Any allergies you have, especially to medicines, shellfish, iodine, and contrast.  All medicines you are taking, including vitamins, herbs, eye drops, creams, and over-the-counter medicines.  Any problems you or family members have had with anesthetic medicines.  Any blood disorders you have.  Any surgeries you have had and any complications that occurred.  Any medical conditions you have.  Whether you are pregnant, may be pregnant, or are breastfeeding.  Any history of smoking or tobacco use. What are the risks? Generally, this is a safe procedure. However, problems may occur, including:  Infection.  Bleeding.  Blood clots.  Allergic reaction to medicines or contrast.  Damage to other structures or organs.  Kidney problems.  Increased risk of cancer. Being exposed to too much radiation over a lifetime can increase the risk of cancer. The risk is small. What happens before the procedure? Medicines Ask your health care provider about:  Changing or stopping your regular medicines. This is especially important if you are taking diabetes medicines or blood thinners.  Taking medicines such as aspirin and ibuprofen. These medicines can thin your blood. Do not take these medicines unless your health care provider tells you to take them.  Taking over-the-counter medicines, vitamins, herbs, and supplements. General instructions  Follow instructions from your health care provider about eating or drinking restrictions.  You may have blood tests  to check how well your kidneys and liver are working and how well your blood can clot.  Plan to have someone take you home from the hospital or clinic. What happens during the procedure?   An IV will be inserted into one of your veins.  You may be given a medicine to help you relax (sedative).  You will lie down on an X-ray table. The table may be tilted in different directions during the procedure to help the contrast move throughout your body. Safety straps will keep you secure if the table is tilted.  If veins in your arm or leg will be examined, a band may be wrapped around that arm or leg to keep the veins full of blood. This may cause your arm or leg to feel numb.  The contrast will be injected into your IV. You may have a hot, flushed feeling as it moves throughout your body. You may also have a metallic taste in your mouth. Both of these sensations will go away after the test is complete.  You may be asked to lie in different positions or place your legs or arms in different positions.  At the end of the procedure, you may be given IV fluids to help wash or flush the contrast out of your veins.  The IV will be removed, and pressure will be applied to the IV site to prevent bleeding. A bandage (dressing) may be applied to the IV site. The exact procedure may vary among health care providers and hospitals. What can I expect after the procedure?  Your blood pressure, heart rate, breathing rate, and blood oxygen level will be monitored until you   leave the hospital or clinic.  You may be given something to eat and drink.  You may have bruising or mild discomfort in the area where the IV was inserted. Follow these instructions at home: Eating and drinking   Follow instructions from your health care provider about eating or drinking restrictions.  Drink a lot of water for the first several days after the procedure, as directed by your health care provider. This helps to flush the  contrast out of your body. Activity  Rest as told by your health care provider.  Return to your normal activities as told by your health care provider. Ask your health care provider what activities are safe for you.  If you were given a sedative during your procedure, do not drive for 24 hours or until your health care provider approves. General instructions  Check your IV insertion area every day for signs of infection. Check for: ? Redness, swelling, or pain. ? Fluid or blood. ? Warmth. ? Pus or a bad smell.  Take over-the-counter and prescription medicines only as told by your health care provider.  Keep all follow-up visits as told by your health care provider. This is important. Contact a health care provider if:  Your skin becomes itchy or you develop a rash or hives.  You have a fever that does not get better with medicine.  You feel nauseous or you vomit.  You have redness, swelling, or pain around the insertion site.  You have fluid or blood coming from the insertion site.  Your insertion area feels warm to the touch.  You have pus or a bad smell coming from the insertion site. Get help right away if you:  Have shortness of breath or difficulty breathing.  Develop chest pain.  Faint.  Feel very dizzy. These symptoms may represent a serious problem that is an emergency. Do not wait to see if the symptoms will go away. Get medical help right away. Call your local emergency services (911 in the [place] Do not drive yourself to the hospital. Summary  A venogram, or venography, is a procedure that uses an X-ray and contrast dye to check how well the veins work and how blood flows through them.  An IV will be inserted into one of your veins in order to inject the contrast.  During the exam, you will lie on an X-ray table. The table may be tilted in different directions during the procedure to help the contrast move throughout your body. Safety straps will keep you  secure.  After the procedure, you will need to drink a lot of water to help wash or flush the contrast out of your body. This information is not intended to replace advice given to you by your health care provider. Make sure you discuss any questions you have with your health care provider. Document Revised: 12/29/2018 Document Reviewed: 12/29/2018 Elsevier Patient Education  2020 [employer]  

## 2020-03-13 NOTE — Interval H&P Note (Signed)
History and Physical Interval Note:  03/13/2020 7:04 AM  Gabriel Ramos  has presented today for surgery, with the diagnosis of shortenss of breath and edema.  The various methods of treatment have been discussed with the patient and family. After consideration of risks, benefits and other options for treatment, the patient has consented to  Procedure(s): RIGHT HEART CATH (N/A) as a surgical intervention.  The patient's history has been reviewed, patient examined, no change in status, stable for surgery.  I have reviewed the patient's chart and labs.  Questions were answered to the patient's satisfaction.     [name]

## 2020-03-25 ENCOUNTER — Other Ambulatory Visit: Payer: Self-pay

## 2020-03-25 ENCOUNTER — Encounter: Payer: Self-pay | Admitting: Physician Assistant

## 2020-03-25 ENCOUNTER — Ambulatory Visit (INDEPENDENT_AMBULATORY_CARE_PROVIDER_SITE_OTHER): Payer: Medicare HMO | Admitting: Physician Assistant

## 2020-03-25 VITALS — BP 102/58 | HR 74 | Ht 70.0 in | Wt 136.2 lb

## 2020-03-25 DIAGNOSIS — D539 Nutritional anemia, unspecified: Secondary | ICD-10-CM | POA: Diagnosis not present

## 2020-03-25 DIAGNOSIS — E8809 Other disorders of plasma-protein metabolism, not elsewhere classified: Secondary | ICD-10-CM | POA: Diagnosis not present

## 2020-03-25 DIAGNOSIS — R600 Localized edema: Secondary | ICD-10-CM | POA: Diagnosis not present

## 2020-03-25 NOTE — Patient Instructions (Addendum)
Medication Instructions:  Continue current medications  *If you need a refill on your cardiac medications before your next appointment, please call your pharmacy*   Follow-Up: At [facility], you and your health needs are our priority.  As part of our continuing mission to provide you with exceptional heart care, we have created designated Provider Care Teams.  These Care Teams include your primary Cardiologist (physician) and Advanced Practice Providers (APPs -  Physician Assistants and Nurse Practitioners) who all work together to provide you with the care you need, when you need it.  We recommend signing up for the patient portal called "MyChart".  Sign up information is provided on this After Visit Summary.  MyChart is used to connect with patients for Virtual Visits (Telemedicine).  Patients are able to view lab/test results, encounter notes, upcoming appointments, etc.  Non-urgent messages can be sent to your provider as well.   To learn more about what you can do with MyChart, go to [email or web address].    Your next appointment:   6 month(s)  The format for your next appointment:   In Person  Provider:   Fransico Him, MD   Other Instructions  Your swelling in your legs is not related to anything with your heart. You previously had an ultrasound that was negative for a blood clot. A CT scan in June ruled out any masses compressing the veins returning blood to your heart. Your heart function (ejection fraction) is normal and the right sided cardiac catheterization did not demonstrate evidence of congestive heart failure. Your protein (albumin) has been low and you are anemic. I think you need to follow up with your primary care doctor to evaluate and manage these. I will give you a prescription for compression hose. Wear these daily to cut down on the swelling. Please schedule a follow up with your primary care doctor. If your nausea continues, schedule follow up with your  gastroenterologist.

## 2020-03-25 NOTE — Progress Notes (Signed)
Cardiology Office Note:    Date:  03/25/2020   ID:  RONOLD HARDGROVE, DOB 12/19/1950, MRN [id number]  PCP:  Hulan Fess, MD  [facility] Cardiologist:  Fransico Him, MD  [place] Electrophysiologist:  None   Referring MD: Hulan Fess, MD   Chief Complaint:  Follow-up (Edema)    Patient Profile:    Gabriel Ramos is a 69 y.o. 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 Albumin 6/21: 2.7  Asthma, severe persistent  Depression  GERD  History of SVT in 2013  Myoview 2013 low risk  Myoview 9/21: low risk   Ehcho 9/21: EF 60-65, Gr 1 DD  History of syncope in 2017 (normal event monitor)  Prior CV studies: R cardiac catheterization 03/13/20 Mean RA 3, RV 24/4, PA 23/10, mean PCWP 10, Fick CO 7.6, Fick CI 4.3  Conclusions: 1. Normal left heart, right heart, and pulmonary artery pressures. 2. Normal to supranormal cardiac output.  Recommendations: 1. No evidence of volume overload to explain edema and shortness of breath.  Continue workup for other potential etiologies.   Echocardiogram 02/19/2020  EF 60-65, no RWMA, mild LVH, GR 1 DD, normal RVSF, RVSP 23.1, trivial MR  Myoview 03/03/2020 EF 84, normal perfusion; low risk  venous US 12/26/2019 No DVT bilaterally  Event monitor 10/03/2018 Sinus rhythm, average heart rate 79 (55-157); occasional PACs, PVCs  Echocardiogram 04/18/2016 Mild concentric LVH, EF 55, GR 1 DD, trace MR, trace PI, mild TR, normal RVSF  History of Present Illness:    Mr. Grajales was referred for evaluation of edema by Dr. Loletha Carrow with GI in Aug 2021.  He had been evaluated for suspected cirrhosis.  However, his evaluation was notable for alcoholic FLD but not cirrhosis.  His LE edema was not responding to high dose diuretics.  Venous US in 12/2019 was neg for DVT.  I  changed his furosemide to torsemide without much improvement in his edema.  He also noted symptoms of shortness of breath with exertion.  I set him up for an echocardiogram which demonstrated normal LV and RV function.  There was mild diastolic dysfunction.  Nuclear stress test demonstrated normal perfusion.  He was ultimately set up for a right heart catheterization which was performed 03/13/2020.  This demonstrated normal left heart, right heart and pulmonary artery pressures as well as normal to supranormal cardiac output.  There was no evidence of volume overload to explain edema and shortness of breath.  Of note, his Alb has been as low as 2.7.  Recent Hgb was 9.5   He returns for follow-up.  He is here alone.  His swelling is unchanged.  His legs are weak from this.  He slipped on the pine straw coming into the office today and fell to the ground.  He did not injure himself.  He did require help getting back up.  He has not had chest discomfort.  His breathing is unchanged.  It may be slightly better since his primary care doctor placed him on prednisone.  His nausea is currently improved after starting on prednisone.  He is eating better.  He sleeps sitting up due to previous issues with nausea.  He has not had syncope or dizziness.      Past Medical History:  Diagnosis Date  . Abnormal CXR 09/16/2016  Report 09/07/16 c/w granuloma  . Alcohol dependence with unspecified alcohol-induced disorder ([place])   . Alcoholic hepatitis without ascites   . ALLERGIC RHINITIS 09/14/2006   Qualifier: Diagnosis of  By: [name]    . Anxiety   . Asthma, chronic, severe persistent, uncomplicated 2/40/9735   Allergy profile 09/15/2016 >  Eos 0.5/  IgE  271 Grass > dog  - 09/15/2016    try symbicort 160 2bid  - 10/17/2016  After extensive coaching HFA effectiveness =    75%  (ti too short)  - sinus CT 03/02/2017 >  Pos polyp    . BPH (benign prostatic hyperplasia)   . Concentric left ventricular hypertrophy   .  DEPRESSION 09/14/2006   Qualifier: Diagnosis of  By: [name]    . Echocardiogram    Echocardiogram 9/21: EF 60-65, no RWMA, mild LVH, Gr 1 DD, normal RVSF, RVSP 23.1, trivial MR  . GERD (gastroesophageal reflux disease)   . Gout 03/28/2012  . Hepatitis C   . History of nuclear stress test    Myoview 9/21: EF 84, normal perfusion; low risk  . History of paroxysmal supraventricular tachycardia   . Hypertension   . Hypoalbuminemia   . Hypokalemia   . Nausea   . Opioid abuse ([place])   . Simple chronic bronchitis ([place])   . SVT (supraventricular tachycardia) ([place])    2013,echo [place] NI LV EF ,mild MR: nl stress cl 3/13  . SVT (supraventricular tachycardia) ([place]) 08/12/2018   Cardiac work-up in 2013 normal  . Vertigo     Current Medications: Current Meds  Medication Sig  . albuterol (PROAIR HFA) 108 (90 Base) MCG/ACT inhaler Inhale 2 puffs into the lungs every 6 (six) hours as needed for wheezing or shortness of breath.  Marland Kitchen albuterol (PROVENTIL) (2.5 MG/3ML) 0.083% nebulizer solution Take 3 mLs (2.5 mg total) by nebulization every 6 (six) hours as needed for wheezing or shortness of breath.  . Buprenorphine HCl-Naloxone HCl 8-2 MG FILM Place 1 Film under the tongue every other day.   . diphenhydrAMINE (BENADRYL) 25 MG tablet Take 25 mg by mouth every 6 (six) hours as needed.  Marland Kitchen FLUoxetine (PROZAC) 20 MG capsule Take 20 mg by mouth daily.  . fluticasone (FLONASE) 50 MCG/ACT nasal spray Place 1 spray into both nostrils daily.  . fluticasone (FLOVENT HFA) 110 MCG/ACT inhaler Inhale 2 puffs into the lungs at bedtime.  . Nebulizers (NEBULIZER COMPRESSOR) MISC Use as directed Dx:  . ondansetron (ZOFRAN) 4 MG tablet Take 1 tablet (4 mg total) by mouth every 6 (six) hours. (Patient taking differently: Take 4 mg by mouth every 8 (eight) hours as needed for nausea. )  . pantoprazole (PROTONIX) 40 MG tablet TAKE 1 TABLET (40 MG TOTAL) BY MOUTH DAILY. TAKE 30-60 MIN BEFORE FIRST MEAL OF THE DAY  (Patient taking differently: Take 40 mg by mouth at bedtime. Take 30-60 min before first meal of the day)  . potassium chloride 20 MEQ/15ML (10%) SOLN Take 15 mLs (20 mEq total) by mouth daily.  Marland Kitchen Spacer/Aero-Holding Chambers (AEROCHAMBER MV) inhaler Use as instructed  . spironolactone (ALDACTONE) 100 MG tablet Take 100 mg by mouth daily.   Marland Kitchen torsemide (DEMADEX) 20 MG tablet Take 1 tablet (20 mg total) by mouth daily.  . [DISCONTINUED] predniSONE (STERAPRED UNI-PAK 48 TAB) 10 MG (48) TBPK tablet Take 40 mg by mouth daily. Taper  40 mg for four days 20 mg for four days     Allergies:   Aspirin  and Penicillins   Social History   Tobacco Use  . Smoking status: Never Smoker  . Smokeless tobacco: Never Used  Vaping Use  . Vaping Use: Never used  Substance Use Topics  . Alcohol use: Yes    Alcohol/week: 3.0 standard drinks    Types: 3 Shots of liquor per week    Comment: smirnoffs, BRANDY BOTTLE EVERY OTHER DAY  . Drug use: Yes    Frequency: 7.0 times per week    Types: Marijuana    Comment: this morning 08/27/19     Family Hx: The patient's family history includes Asthma in his brother; Heart disease in his brother and father; Heart murmur in his mother; Liver cancer in his brother. There is no history of Colon cancer, Esophageal cancer, Rectal cancer, or Stomach cancer.  ROS   EKGs/Labs/Other Test Reviewed:    EKG:  EKG is not ordered today.  The ekg ordered today demonstrates n/a  Recent Labs: 01/17/2020: NT-Pro BNP 544 01/27/2020: ALT 20 01/30/2020: TSH 1.520 03/11/2020: BUN 14; Creatinine, Ser 0.83; Platelets 468 03/13/2020: Hemoglobin 9.5; Potassium 4.3; Sodium 139   Recent Lipid Panel Lab Results  Component Value Date/Time   CHOL 156 10/24/2006 01:36 PM   TRIG 83 10/24/2006 01:36 PM   HDL 58.1 10/24/2006 01:36 PM   CHOLHDL 2.7 CALC 10/24/2006 01:36 PM   LDLCALC 81 10/24/2006 01:36 PM      Risk Assessment/Calculations:      Physical Exam:    VS:  BP (!) 102/58    Pulse 74   Ht 5\' 10"  (1.778 m)   Wt 136 lb 3.2 oz (61.8 kg)   SpO2 99%   BMI 19.54 kg/m     Wt Readings from Last 3 Encounters:  03/25/20 136 lb 3.2 oz (61.8 kg)  03/13/20 135 lb (61.2 kg)  03/11/20 132 lb (59.9 kg)     Constitutional:      Appearance: Healthy appearance. Not in distress.  Pulmonary:     Effort: Pulmonary effort is normal.     Breath sounds: No wheezing. No rales.  Cardiovascular:     Normal rate. Regular rhythm. Normal S1. Normal S2.     Murmurs: There is no murmur.  Edema:    Thigh: bilateral trace pitting edema of the thigh.    Pretibial: bilateral 2+ pitting edema of the pretibial area.    Ankle: bilateral 1+ pitting edema of the ankle. Abdominal:     Palpations: Abdomen is soft.  Musculoskeletal:     Cervical back: Neck supple. Skin:    General: Skin is warm and dry.  Neurological:     General: No focal deficit present.     Mental Status: Alert and oriented to person, place and time.     Cranial Nerves: Cranial nerves are intact.      ASSESSMENT & PLAN:    1. Lower extremity edema 2. Hypoalbuminemia 3. Macrocytic anemia He continues to have issues with lower extremity edema.  Right heart catheterization did not demonstrate any evidence of heart failure.  His EF is normal on echocardiogram.  Venous Dopplers have previously ruled out DVT.  He had an abdominal and pelvic CT in June 2021.  There was no evident mass.  He was not felt to have volume excess related to cirrhosis as cirrhosis was ruled out by gastroenterology.  He does have macrocytic anemia.  I suspect he is probably deficient in B12 or folate given his prior history of alcohol abuse.  He also has a  history of hypoalbuminemia.  He does not really have signs of lymphedema or lipidema.  Given his fairly unremarkable work-up, it is possible that his edema is related to underlying venous insufficiency in the setting of hypoalbuminemia and anemia.  I have given him a prescription for thigh-high  compression stockings.  Continue current dose of diuretics.  I have asked him to follow-up with primary care for further evaluation management of his low albumin and anemia.  If his swelling continues despite compression stockings, we could consider sending him to the wound clinic to see if his legs can be wrapped.    Dispo:  Return in about 6 months (around 09/23/2020) for Routine Follow Up w/ Dr. Radford Pax.   Medication Adjustments/Labs and Tests Ordered: Current medicines are reviewed at length with the patient today.  Concerns regarding medicines are outlined above.  Tests Ordered: No orders of the defined types were placed in this encounter.  Medication Changes: No orders of the defined types were placed in this encounter.   Signed, Richardson Dopp, PA-C  03/25/2020 3:09 PM    [facility] [place], [place], [place]  [address] Phone: [phone]; Fax: [phone]

## 2020-04-29 ENCOUNTER — Other Ambulatory Visit: Payer: Self-pay | Admitting: Physician Assistant

## 2020-04-29 DIAGNOSIS — Z01812 Encounter for preprocedural laboratory examination: Secondary | ICD-10-CM

## 2020-06-12 ENCOUNTER — Emergency Department (HOSPITAL_COMMUNITY): Payer: Medicare HMO

## 2020-06-12 ENCOUNTER — Encounter (HOSPITAL_COMMUNITY): Payer: Self-pay | Admitting: Emergency Medicine

## 2020-06-12 ENCOUNTER — Inpatient Hospital Stay (HOSPITAL_COMMUNITY)
Admission: EM | Admit: 2020-06-12 | Discharge: 2020-07-07 | DRG: 871 | Disposition: E | Payer: Medicare HMO | Attending: Emergency Medicine | Admitting: Emergency Medicine

## 2020-06-12 DIAGNOSIS — K760 Fatty (change of) liver, not elsewhere classified: Secondary | ICD-10-CM | POA: Diagnosis present

## 2020-06-12 DIAGNOSIS — R1310 Dysphagia, unspecified: Secondary | ICD-10-CM

## 2020-06-12 DIAGNOSIS — L03115 Cellulitis of right lower limb: Secondary | ICD-10-CM | POA: Diagnosis present

## 2020-06-12 DIAGNOSIS — Z515 Encounter for palliative care: Secondary | ICD-10-CM | POA: Diagnosis not present

## 2020-06-12 DIAGNOSIS — L03116 Cellulitis of left lower limb: Secondary | ICD-10-CM | POA: Diagnosis present

## 2020-06-12 DIAGNOSIS — E43 Unspecified severe protein-calorie malnutrition: Secondary | ICD-10-CM | POA: Diagnosis present

## 2020-06-12 DIAGNOSIS — E871 Hypo-osmolality and hyponatremia: Secondary | ICD-10-CM | POA: Diagnosis present

## 2020-06-12 DIAGNOSIS — R0902 Hypoxemia: Secondary | ICD-10-CM

## 2020-06-12 DIAGNOSIS — N179 Acute kidney failure, unspecified: Secondary | ICD-10-CM | POA: Diagnosis not present

## 2020-06-12 DIAGNOSIS — Z79891 Long term (current) use of opiate analgesic: Secondary | ICD-10-CM

## 2020-06-12 DIAGNOSIS — J189 Pneumonia, unspecified organism: Secondary | ICD-10-CM | POA: Diagnosis not present

## 2020-06-12 DIAGNOSIS — K219 Gastro-esophageal reflux disease without esophagitis: Secondary | ICD-10-CM | POA: Diagnosis present

## 2020-06-12 DIAGNOSIS — Z825 Family history of asthma and other chronic lower respiratory diseases: Secondary | ICD-10-CM

## 2020-06-12 DIAGNOSIS — I959 Hypotension, unspecified: Secondary | ICD-10-CM

## 2020-06-12 DIAGNOSIS — R000 Tachycardia, unspecified: Secondary | ICD-10-CM | POA: Diagnosis not present

## 2020-06-12 DIAGNOSIS — R0602 Shortness of breath: Secondary | ICD-10-CM | POA: Diagnosis not present

## 2020-06-12 DIAGNOSIS — E860 Dehydration: Secondary | ICD-10-CM | POA: Diagnosis present

## 2020-06-12 DIAGNOSIS — D696 Thrombocytopenia, unspecified: Secondary | ICD-10-CM | POA: Diagnosis not present

## 2020-06-12 DIAGNOSIS — Z789 Other specified health status: Secondary | ICD-10-CM

## 2020-06-12 DIAGNOSIS — D6959 Other secondary thrombocytopenia: Secondary | ICD-10-CM | POA: Diagnosis not present

## 2020-06-12 DIAGNOSIS — L03119 Cellulitis of unspecified part of limb: Secondary | ICD-10-CM | POA: Diagnosis present

## 2020-06-12 DIAGNOSIS — E861 Hypovolemia: Secondary | ICD-10-CM | POA: Diagnosis not present

## 2020-06-12 DIAGNOSIS — Z7289 Other problems related to lifestyle: Secondary | ICD-10-CM

## 2020-06-12 DIAGNOSIS — K732 Chronic active hepatitis, not elsewhere classified: Secondary | ICD-10-CM | POA: Diagnosis present

## 2020-06-12 DIAGNOSIS — R001 Bradycardia, unspecified: Secondary | ICD-10-CM | POA: Diagnosis not present

## 2020-06-12 DIAGNOSIS — Y95 Nosocomial condition: Secondary | ICD-10-CM | POA: Diagnosis not present

## 2020-06-12 DIAGNOSIS — E872 Acidosis: Secondary | ICD-10-CM | POA: Diagnosis not present

## 2020-06-12 DIAGNOSIS — K7200 Acute and subacute hepatic failure without coma: Secondary | ICD-10-CM | POA: Diagnosis present

## 2020-06-12 DIAGNOSIS — F419 Anxiety disorder, unspecified: Secondary | ICD-10-CM | POA: Diagnosis present

## 2020-06-12 DIAGNOSIS — J90 Pleural effusion, not elsewhere classified: Secondary | ICD-10-CM | POA: Diagnosis not present

## 2020-06-12 DIAGNOSIS — J9621 Acute and chronic respiratory failure with hypoxia: Secondary | ICD-10-CM

## 2020-06-12 DIAGNOSIS — G928 Other toxic encephalopathy: Secondary | ICD-10-CM | POA: Diagnosis present

## 2020-06-12 DIAGNOSIS — I10 Essential (primary) hypertension: Secondary | ICD-10-CM | POA: Diagnosis present

## 2020-06-12 DIAGNOSIS — N170 Acute kidney failure with tubular necrosis: Secondary | ICD-10-CM | POA: Diagnosis not present

## 2020-06-12 DIAGNOSIS — R6521 Severe sepsis with septic shock: Secondary | ICD-10-CM | POA: Diagnosis present

## 2020-06-12 DIAGNOSIS — Z7189 Other specified counseling: Secondary | ICD-10-CM | POA: Diagnosis not present

## 2020-06-12 DIAGNOSIS — L8990 Pressure ulcer of unspecified site, unspecified stage: Secondary | ICD-10-CM | POA: Diagnosis present

## 2020-06-12 DIAGNOSIS — D61818 Other pancytopenia: Secondary | ICD-10-CM | POA: Diagnosis not present

## 2020-06-12 DIAGNOSIS — J45909 Unspecified asthma, uncomplicated: Secondary | ICD-10-CM | POA: Diagnosis present

## 2020-06-12 DIAGNOSIS — K224 Dyskinesia of esophagus: Secondary | ICD-10-CM | POA: Diagnosis not present

## 2020-06-12 DIAGNOSIS — J309 Allergic rhinitis, unspecified: Secondary | ICD-10-CM | POA: Diagnosis present

## 2020-06-12 DIAGNOSIS — R4182 Altered mental status, unspecified: Secondary | ICD-10-CM | POA: Diagnosis not present

## 2020-06-12 DIAGNOSIS — F1110 Opioid abuse, uncomplicated: Secondary | ICD-10-CM | POA: Diagnosis present

## 2020-06-12 DIAGNOSIS — R600 Localized edema: Secondary | ICD-10-CM

## 2020-06-12 DIAGNOSIS — T502X5A Adverse effect of carbonic-anhydrase inhibitors, benzothiadiazides and other diuretics, initial encounter: Secondary | ICD-10-CM | POA: Diagnosis not present

## 2020-06-12 DIAGNOSIS — Z681 Body mass index (BMI) 19 or less, adult: Secondary | ICD-10-CM

## 2020-06-12 DIAGNOSIS — K7030 Alcoholic cirrhosis of liver without ascites: Secondary | ICD-10-CM | POA: Diagnosis not present

## 2020-06-12 DIAGNOSIS — J4550 Severe persistent asthma, uncomplicated: Secondary | ICD-10-CM | POA: Diagnosis present

## 2020-06-12 DIAGNOSIS — R918 Other nonspecific abnormal finding of lung field: Secondary | ICD-10-CM | POA: Diagnosis not present

## 2020-06-12 DIAGNOSIS — K7460 Unspecified cirrhosis of liver: Secondary | ICD-10-CM

## 2020-06-12 DIAGNOSIS — Z20822 Contact with and (suspected) exposure to covid-19: Secondary | ICD-10-CM | POA: Diagnosis present

## 2020-06-12 DIAGNOSIS — Z79899 Other long term (current) drug therapy: Secondary | ICD-10-CM

## 2020-06-12 DIAGNOSIS — E876 Hypokalemia: Secondary | ICD-10-CM | POA: Diagnosis present

## 2020-06-12 DIAGNOSIS — J9690 Respiratory failure, unspecified, unspecified whether with hypoxia or hypercapnia: Secondary | ICD-10-CM | POA: Diagnosis not present

## 2020-06-12 DIAGNOSIS — J810 Acute pulmonary edema: Secondary | ICD-10-CM | POA: Diagnosis not present

## 2020-06-12 DIAGNOSIS — A419 Sepsis, unspecified organism: Secondary | ICD-10-CM | POA: Diagnosis present

## 2020-06-12 DIAGNOSIS — Y92239 Unspecified place in hospital as the place of occurrence of the external cause: Secondary | ICD-10-CM | POA: Diagnosis not present

## 2020-06-12 DIAGNOSIS — Z880 Allergy status to penicillin: Secondary | ICD-10-CM

## 2020-06-12 DIAGNOSIS — J9601 Acute respiratory failure with hypoxia: Secondary | ICD-10-CM | POA: Diagnosis not present

## 2020-06-12 DIAGNOSIS — Z8249 Family history of ischemic heart disease and other diseases of the circulatory system: Secondary | ICD-10-CM

## 2020-06-12 DIAGNOSIS — Z66 Do not resuscitate: Secondary | ICD-10-CM | POA: Diagnosis not present

## 2020-06-12 DIAGNOSIS — F32A Depression, unspecified: Secondary | ICD-10-CM | POA: Diagnosis present

## 2020-06-12 DIAGNOSIS — B182 Chronic viral hepatitis C: Secondary | ICD-10-CM | POA: Diagnosis not present

## 2020-06-12 DIAGNOSIS — R404 Transient alteration of awareness: Secondary | ICD-10-CM | POA: Diagnosis not present

## 2020-06-12 DIAGNOSIS — K7040 Alcoholic hepatic failure without coma: Secondary | ICD-10-CM | POA: Diagnosis present

## 2020-06-12 DIAGNOSIS — F1190 Opioid use, unspecified, uncomplicated: Secondary | ICD-10-CM | POA: Diagnosis present

## 2020-06-12 DIAGNOSIS — R5383 Other fatigue: Secondary | ICD-10-CM | POA: Diagnosis not present

## 2020-06-12 DIAGNOSIS — N400 Enlarged prostate without lower urinary tract symptoms: Secondary | ICD-10-CM | POA: Diagnosis present

## 2020-06-12 LAB — CBG MONITORING, ED: Glucose-Capillary: 124 mg/dL — ABNORMAL HIGH (ref 70–99)

## 2020-06-12 MED ORDER — THIAMINE HCL 100 MG/ML IJ SOLN
100.0000 mg | Freq: Once | INTRAMUSCULAR | Status: AC
  Administered 2020-06-13: 100 mg via INTRAVENOUS
  Filled 2020-06-12: qty 2

## 2020-06-12 NOTE — ED Triage Notes (Signed)
Pt transported from home by [facility] for lethargy, pt found to be 71% on RA, placed on NRB, pt very cold to touch. Incontinent of bowel and bladder. HR irregular, CBG 181. Last etoh 1 week ago. Pt given 200 [place]

## 2020-06-12 NOTE — ED Provider Notes (Signed)
[facility] EMERGENCY DEPARTMENT Provider Note   CSN: [id number] Arrival date & time: 06/06/2020  2243     History Chief Complaint  Patient presents with   Bradycardia    Gabriel Ramos is a 70 y.o. male.  70 year old male with medical problems as documented below who presents to the emergency department today for lethargy.  The history is not entirely clear.  Sounds like he has been feeling bad for a few days but ultimately states that he has been in bed for a week.  No known illnesses.  States that he quit drinking alcohol a week ago.  Patient states he has been nauseous since that time but no throwing up.  Has had some diarrhea.  No other drugs.  There was some questionable bradycardia in route and was being paced apparently but not now.  From wife: diagnosed with hepatitis C recently, always nauseous --> has had EGD, cardiac workup, colonoscopy --> fatty liver --> over last few weeks has had progressively worsening edema intermittently worsening and in the last couple days became even more nauseous and decreased appetite, today fatigued and continued nauseous.      Past Medical History:  Diagnosis Date   Abnormal CXR 09/16/2016   Report 09/07/16 c/w granuloma   Alcohol dependence with unspecified alcohol-induced disorder ([place])    Alcoholic hepatitis without ascites    ALLERGIC RHINITIS 09/14/2006   Qualifier: Diagnosis of  By: [name]     Anxiety    Asthma, chronic, severe persistent, uncomplicated 0000000   Allergy profile 09/15/2016 >  Eos 0.5/  IgE  271 Grass > dog  - 09/15/2016    try symbicort 160 2bid  - 10/17/2016  After extensive coaching HFA effectiveness =    75%  (ti too short)  - sinus CT 03/02/2017 >  Pos polyp     BPH (benign prostatic hyperplasia)    Concentric left ventricular hypertrophy    DEPRESSION 09/14/2006   Qualifier: Diagnosis of  By: [name]     Echocardiogram    Echocardiogram 9/21: EF 60-65, no RWMA, mild LVH, Gr  1 DD, normal RVSF, RVSP 23.1, trivial MR   GERD (gastroesophageal reflux disease)    Gout 03/28/2012   Hepatitis C    History of nuclear stress test    Myoview 9/21: EF 84, normal perfusion; low risk   History of paroxysmal supraventricular tachycardia    Hypertension    Hypoalbuminemia    Hypokalemia    Nausea    Opioid abuse (HCC)    Simple chronic bronchitis (HCC)    SVT (supraventricular tachycardia) ([place])    2013,echo Var[place] NI LV EF ,mild MR: nl stress cl 3/13   SVT (supraventricular tachycardia) ([place]) 08/12/2018   Cardiac work-up in 2013 normal   Vertigo     Patient Active Problem List   Diagnosis Date Noted   Edema 03/13/2020   Shortness of breath    Chronic hepatitis C without hepatic coma ([place]) 08/07/2019   Palpitations 08/12/2018   SVT (supraventricular tachycardia) ([place]) 08/12/2018   Upper respiratory infection 10/17/2016   Essential hypertension 09/16/2016   Abnormal CXR 09/16/2016   Gout 03/28/2012   DEPRESSION 09/14/2006   ALLERGIC RHINITIS 09/14/2006   Asthma, chronic, severe persistent, uncomplicated [id number]    Past Surgical History:  Procedure Laterality Date   polyps remvoed from nose     RIGHT HEART CATH N/A 03/13/2020   Procedure: RIGHT HEART CATH;  Surgeon: Nelva Bush, MD;  Location: [place]  CV LAB;  Service: Cardiovascular;  Laterality: N/A;   UPPER GI ENDOSCOPY     approx 10 years       Family History  Problem Relation Age of Onset   Heart murmur Mother    Heart disease Father    Heart disease Brother    Asthma Brother    Liver cancer Brother        Stage 4    Colon cancer Neg Hx    Esophageal cancer Neg Hx    Rectal cancer Neg Hx    Stomach cancer Neg Hx     Social History   Tobacco Use   Smoking status: Never Smoker   Smokeless tobacco: Never Used  Vaping Use   Vaping Use: Never used  Substance Use Topics   Alcohol use: Yes    Alcohol/week: 3.0 standard drinks     Types: 3 Shots of liquor per week    Comment: [email or web address], BRANDY BOTTLE EVERY OTHER DAY   Drug use: Yes    Frequency: 7.0 times per week    Types: Marijuana    Comment: this morning 08/27/19    Home Medications Prior to Admission medications   Medication Sig Start Date End Date Taking? Authorizing Provider  albuterol (PROAIR HFA) 108 (90 Base) MCG/ACT inhaler Inhale 2 puffs into the lungs every 6 (six) hours as needed for wheezing or shortness of breath. 02/25/20   Spero Geralds, MD  albuterol (PROVENTIL) (2.5 MG/3ML) 0.083% nebulizer solution Take 3 mLs (2.5 mg total) by nebulization every 6 (six) hours as needed for wheezing or shortness of breath. 09/15/16   Tanda Rockers, MD  Buprenorphine HCl-Naloxone HCl 8-2 MG FILM Place 1 Film under the tongue every other day.  08/06/18   [provider]  diphenhydrAMINE (BENADRYL) 25 MG tablet Take 25 mg by mouth every 6 (six) hours as needed.    [provider]  FLUoxetine (PROZAC) 20 MG capsule Take 20 mg by mouth daily.    [provider]  fluticasone (FLONASE) 50 MCG/ACT nasal spray Place 1 spray into both nostrils daily. 02/25/20   Spero Geralds, MD  fluticasone (FLOVENT HFA) 110 MCG/ACT inhaler Inhale 2 puffs into the lungs at bedtime. 02/25/20   Spero Geralds, MD  Nebulizers (NEBULIZER COMPRESSOR) MISC Use as directed Dx: 01/10/17   Tanda Rockers, MD  ondansetron (ZOFRAN) 4 MG tablet Take 1 tablet (4 mg total) by mouth every 6 (six) hours. Patient taking differently: Take 4 mg by mouth every 8 (eight) hours as needed for nausea.  09/15/16   Tanda Rockers, MD  pantoprazole (PROTONIX) 40 MG tablet TAKE 1 TABLET (40 MG TOTAL) BY MOUTH DAILY. TAKE 30-60 MIN BEFORE FIRST MEAL OF THE DAY Patient taking differently: Take 40 mg by mouth at bedtime. Take 30-60 min before first meal of the day 07/07/17   Tanda Rockers, MD  potassium chloride 20 MEQ/15ML (10%) SOLN Take 15 mLs (20 mEq total) by mouth daily. 02/19/20   Richardson Dopp T, PA-C  Spacer/Aero-Holding Chambers (AEROCHAMBER MV) inhaler Use as instructed 10/28/19   Spero Geralds, MD  spironolactone (ALDACTONE) 100 MG tablet Take 100 mg by mouth daily.  10/17/19   [provider]  torsemide (DEMADEX) 20 MG tablet Take 1 tablet (20 mg total) by mouth daily. 01/17/20   Richardson Dopp T, PA-C    Allergies    Aspirin and Penicillins  Review of Systems   Review of Systems  All other systems  reviewed and are negative.   Physical Exam Updated Vital Signs BP 104/67 (BP Location: Right Arm)    Pulse 97    Temp (!) 93.1 F (33.9 C) (Rectal)    Resp 20    Ht 5\' 10"  (1.778 m)    Wt 61 kg    SpO2 100%    BMI 19.30 kg/m   Physical Exam Vitals and nursing note reviewed.  Constitutional:      Appearance: He is well-developed and well-nourished. He is ill-appearing.  HENT:     Head: Normocephalic and atraumatic.     Nose: Nose normal. No congestion or rhinorrhea.     Mouth/Throat:     Mouth: Mucous membranes are moist.     Pharynx: Oropharynx is clear.  Eyes:     General: Scleral icterus present.     Pupils: Pupils are equal, round, and reactive to light.  Cardiovascular:     Rate and Rhythm: Normal rate.  Pulmonary:     Effort: Pulmonary effort is normal. No respiratory distress.  Abdominal:     General: Abdomen is flat. There is no distension.  Musculoskeletal:        General: Normal range of motion.     Cervical back: Normal range of motion.  Skin:    General: Skin is warm and dry.     Coloration: Skin is jaundiced.     Findings: Rash (diffuse erythematous rash. also with significant erythema to both legs. questionable embolic wounds to toes. sacral wound with surrounding erythema) present.  Neurological:     General: No focal deficit present.     Mental Status: He is alert.     ED Results / Procedures / Treatments   Labs (all labs ordered are listed, but only abnormal results are displayed) Labs Reviewed  CBC WITH  DIFFERENTIAL/PLATELET - Abnormal; Notable for the following components:      Result Value   RBC 3.06 (*)    Hemoglobin 10.3 (*)    HCT 30.9 (*)    MCV 101.0 (*)    Lymphs Abs 0.3 (*)    All other components within normal limits  COMPREHENSIVE METABOLIC PANEL - Abnormal; Notable for the following components:   Sodium 131 (*)    Chloride 96 (*)    CO2 13 (*)    Glucose, Bld 125 (*)    BUN 67 (*)    Creatinine, Ser 7.20 (*)    Calcium 7.8 (*)    Total Protein 4.6 (*)    Albumin 1.7 (*)    Alkaline Phosphatase 127 (*)    Total Bilirubin 2.0 (*)    GFR, Estimated 8 (*)    Anion gap 22 (*)    All other components within normal limits  LACTIC ACID, PLASMA - Abnormal; Notable for the following components:   Lactic Acid, Venous 5.8 (*)    All other components within normal limits  AMMONIA - Abnormal; Notable for the following components:   Ammonia 48 (*)    All other components within normal limits  RAPID URINE DRUG SCREEN, HOSP PERFORMED - Abnormal; Notable for the following components:   Tetrahydrocannabinol POSITIVE (*)    All other components within normal limits  URINALYSIS, COMPLETE (UACMP) WITH MICROSCOPIC - Abnormal; Notable for the following components:   Color, Urine AMBER (*)    APPearance HAZY (*)    Hgb urine dipstick SMALL (*)    Protein, ur 30 (*)    All other components within normal limits  MAGNESIUM -  Abnormal; Notable for the following components:   Magnesium 1.6 (*)    All other components within normal limits  LACTIC ACID, PLASMA - Abnormal; Notable for the following components:   Lactic Acid, Venous 4.1 (*)    All other components within normal limits  LACTIC ACID, PLASMA - Abnormal; Notable for the following components:   Lactic Acid, Venous 2.8 (*)    All other components within normal limits  BRAIN NATRIURETIC PEPTIDE - Abnormal; Notable for the following components:   B Natriuretic Peptide 471.1 (*)    All other components within normal limits  CBC -  Abnormal; Notable for the following components:   RBC 2.46 (*)    Hemoglobin 8.6 (*)    HCT 23.8 (*)    MCH 35.0 (*)    MCHC 36.1 (*)    All other components within normal limits  CREATININE, SERUM - Abnormal; Notable for the following components:   Creatinine, Ser 6.27 (*)    GFR, Estimated 9 (*)    All other components within normal limits  BASIC METABOLIC PANEL - Abnormal; Notable for the following components:   Sodium 134 (*)    Potassium 3.4 (*)    CO2 21 (*)    Glucose, Bld 69 (*)    BUN 63 (*)    Creatinine, Ser 5.68 (*)    Calcium 7.0 (*)    GFR, Estimated 10 (*)    All other components within normal limits  GLUCOSE, CAPILLARY - Abnormal; Notable for the following components:   Glucose-Capillary 62 (*)    All other components within normal limits  GLUCOSE, CAPILLARY - Abnormal; Notable for the following components:   Glucose-Capillary 186 (*)    All other components within normal limits  CBG MONITORING, ED - Abnormal; Notable for the following components:   Glucose-Capillary 124 (*)    All other components within normal limits  CBG MONITORING, ED - Abnormal; Notable for the following components:   Glucose-Capillary 66 (*)    All other components within normal limits  I-STAT VENOUS BLOOD GAS, ED - Abnormal; Notable for the following components:   pCO2, Ven 22.3 (*)    pO2, Ven 48.0 (*)    Bicarbonate 12.1 (*)    TCO2 13 (*)    Acid-base deficit 12.0 (*)    Sodium 130 (*)    Calcium, Ion 0.94 (*)    HCT 31.0 (*)    Hemoglobin 10.5 (*)    All other components within normal limits  TROPONIN I (HIGH SENSITIVITY) - Abnormal; Notable for the following components:   Troponin I (High Sensitivity) 34 (*)    All other components within normal limits  RESP PANEL BY RT-PCR (FLU A&B, COVID) ARPGX2  CULTURE, BLOOD (ROUTINE X 2)  CULTURE, BLOOD (ROUTINE X 2)  URINE CULTURE  ETHANOL  TSH  T4, FREE  HIV ANTIBODY (ROUTINE TESTING W REFLEX)  SODIUM, URINE, RANDOM   CREATININE, URINE, RANDOM  LIPASE, BLOOD  CK  HCV RNA QUANT  RHEUMATOID FACTOR  CBC  BASIC METABOLIC PANEL  MAGNESIUM  PHOSPHORUS    EKG EKG Interpretation  Date/Time:  Friday June 12 2020 22:51:36 EST Ventricular Rate:  101 PR Interval:    QRS Duration: 96 QT Interval:  385 QTC Calculation: 500 R Axis:   -70 Text Interpretation: Sinus tachycardia Right atrial enlargement Abnormal R-wave progression, early transition Inferior infarct, old Confirmed by Thayer Jew [phone]) on 06/13/2020 12:02:15 PM   Radiology CT HEAD WO CONTRAST  Result Date: 06/13/2020  CLINICAL DATA:  Altered mental status.  Lethargy. EXAM: CT HEAD WITHOUT CONTRAST TECHNIQUE: Contiguous axial images were obtained from the base of the skull through the vertex without intravenous contrast. COMPARISON:  Head CT 06/22/2015 FINDINGS: Brain: Generalized atrophy is slightly advanced for age. Mild periventricular and deep white matter hypodensity typical of chronic small vessel ischemia. No intracranial hemorrhage, mass effect, or midline shift. No hydrocephalus. The basilar cisterns are patent. No evidence of territorial infarct or acute ischemia. No extra-axial or intracranial fluid collection. Vascular: Atherosclerosis of skullbase vasculature without hyperdense vessel or abnormal calcification. Skull: No fracture or focal lesion. Sinuses/Orbits: Chronic paranasal sinus disease with postsurgical change. Scattered areas of mucosal thickening as well as heterogeneous debris in the right greater than left maxillary sinus. Minimal opacification of lower right mastoid air cells. Other: None. IMPRESSION: 1. No acute intracranial abnormality. 2. Generalized atrophy and chronic small vessel ischemia. 3. Chronic paranasal sinus disease. Bubbly debris within the right greater than left maxillary sinus may be acute on chronic sinus disease. Electronically Signed   By: Keith Rake M.D.   On: 06/13/2020 00:06   US  Renal  Result Date: 06/13/2020 CLINICAL DATA:  Acute renal failure EXAM: RENAL / URINARY TRACT ULTRASOUND COMPLETE COMPARISON:  CT 12/03/2019, ultrasound 11/30/2016 FINDINGS: Right Kidney: Renal measurements: 8.6 x 5.6 x 5.0 cm = volume: 127.1 mL. Renal cortical echogenicity may be mildly increased though difficult to completely assess given what is likely a degree of up attic steatosis as well. There is a 1.1 x 1.2 x 1.4 cm predominantly anechoic, cystic appearing lesion with increased posterior through transmission. A single thin internal septation is noted within this structure. No concerning internal vascularity or vascular mural nodularity. No other focal renal lesions. No hydronephrosis or shadowing calculus. Left Kidney: Renal measurements: 9.0 x 5.0 x 4.2 cm = volume: 90.6 mL. Slightly increased renal cortical echogenicity. Simple appearing anechoic 1.6 x 1.4 x 1.2 cm cyst in the upper pole with increased posterior through transmission. No concerning internal complexity. No other concerning renal lesions. No hydronephrosis or shadowing calculus. Bladder: Appears normal for degree of bladder distention. Other: Diffuse hepatic hypoattenuation compatible with hepatic steatosis. IMPRESSION: Technically challenging exam given the presence of extensive support devices and patient's or clinical status. Likely increased bilateral renal cortical echogenicity compatible with medical renal disease. Minimally complex cyst in the upper pole right kidney measuring 1.4 cm, not significantly changed from comparison Imaging in 2018. No dedicated follow-up imaging is typically warranted. Additional simple appearing cyst is seen in the upper pole left kidney as well. Incidental note made of hepatic steatosis. Electronically Signed   By: Lovena Le M.D.   On: 06/13/2020 06:46   DG Chest Portable 1 View  Result Date: 06/24/2020 CLINICAL DATA:  Lethargy, bradycardia EXAM: PORTABLE CHEST 1 VIEW COMPARISON:  Radiograph  10/17/2019 FINDINGS: Small stable left midlung granuloma. Mild bronchitic changes are similar to comparison images. No consolidation, features of edema, pneumothorax, or effusion. Pulmonary vascularity is normally distributed. The cardiomediastinal contours are unremarkable. No acute osseous or soft tissue abnormality. Telemetry leads overlie the chest. IMPRESSION: No acute cardiopulmonary abnormality. Electronically Signed   By: Lovena Le M.D.   On: 06/25/2020 23:57   US Abdomen Limited RUQ (LIVER/GB)  Result Date: 06/13/2020 CLINICAL DATA:  Cirrhosis of liver. EXAM: ULTRASOUND ABDOMEN LIMITED RIGHT UPPER QUADRANT COMPARISON:  None. FINDINGS: Gallbladder: No gallstones or wall thickening visualized. No sonographic Murphy sign noted by sonographer. Common bile duct: Diameter: Not seen Liver: Liver is  diffusely echogenic indicating fatty infiltration. No focal mass or lesion is seen within the liver. Portal vein is patent on color Doppler imaging with normal direction of blood flow towards the liver. Other: All 4 quadrants evaluated.  No free fluid is identified. IMPRESSION: 1. No acute findings. No gallstones. No evidence of acute cholecystitis. No ascites. 2. Fatty infiltration of the liver. 3. Common bile duct is not seen, presumably nondistended. Electronically Signed   By: Franki Cabot M.D.   On: 06/13/2020 10:33    Procedures .Critical Care Performed by: Merrily Pew, MD Authorized by: Merrily Pew, MD   Critical care provider statement:    Critical care time (minutes):  45   Critical care was necessary to treat or prevent imminent or life-threatening deterioration of the following conditions:  Renal failure, respiratory failure, sepsis, shock, dehydration, circulatory failure and hepatic failure   Critical care was time spent personally by me on the following activities:  Discussions with consultants, evaluation of patient's response to treatment, examination of patient, ordering and  performing treatments and interventions, ordering and review of laboratory studies, ordering and review of radiographic studies, pulse oximetry, re-evaluation of patient's condition, obtaining history from patient or surrogate and review of old charts   (including critical care time)  Medications Ordered in ED Medications  thiamine (B-1) injection 100 mg (has no administration in time range)    ED Course  I have reviewed the triage vital signs and the nursing notes.  Pertinent labs & imaging results that were available during my care of the patient were reviewed by me and considered in my medical decision making (see chart for details).    MDM Rules/Calculators/A&P                         Patient is very ill-appearing.  It is unclear what the etiology.  There is multiple possibilities.  His temperature was very low and he really was bradycardic and lethargic he always consider myxedema coma.  Patient does not have any focal deficits to suggest stroke but more of a global or metabolic etiology.  Could be hyponatremia, hyperammonemia or possibly infectious. Work-up with significant acute renal failure.  Lactic acid also elevated.  Blood pressures trending down.  Multiple use of fluid given and does not be fluid responsive.  We will plan for admission to hospitalist at this time.  Continued trend of fluids down.  Hospitalist prefers to wait for day team to come.  Continue to have low pressures will start peripheral Levophed.  Discussed with critical care for admission. Possibly hypovolemia from decreased intake, overdiiuresis, sepsis, cirrhosis.   Final Clinical Impression(s) / ED Diagnoses Final diagnoses:  Acute renal failure, unspecified acute renal failure type [facility])    Rx / DC Orders ED Discharge Orders    None       Yizel Canby, Corene Cornea, MD 06/14/20 0210

## 2020-06-13 ENCOUNTER — Inpatient Hospital Stay (HOSPITAL_COMMUNITY): Payer: Medicare HMO

## 2020-06-13 ENCOUNTER — Emergency Department (HOSPITAL_COMMUNITY): Payer: Medicare HMO

## 2020-06-13 DIAGNOSIS — R918 Other nonspecific abnormal finding of lung field: Secondary | ICD-10-CM | POA: Diagnosis not present

## 2020-06-13 DIAGNOSIS — L03116 Cellulitis of left lower limb: Secondary | ICD-10-CM | POA: Diagnosis present

## 2020-06-13 DIAGNOSIS — N170 Acute kidney failure with tubular necrosis: Secondary | ICD-10-CM | POA: Diagnosis not present

## 2020-06-13 DIAGNOSIS — E872 Acidosis: Secondary | ICD-10-CM | POA: Diagnosis not present

## 2020-06-13 DIAGNOSIS — K7460 Unspecified cirrhosis of liver: Secondary | ICD-10-CM | POA: Diagnosis present

## 2020-06-13 DIAGNOSIS — I959 Hypotension, unspecified: Secondary | ICD-10-CM | POA: Diagnosis not present

## 2020-06-13 DIAGNOSIS — J9690 Respiratory failure, unspecified, unspecified whether with hypoxia or hypercapnia: Secondary | ICD-10-CM | POA: Diagnosis not present

## 2020-06-13 DIAGNOSIS — Z515 Encounter for palliative care: Secondary | ICD-10-CM | POA: Diagnosis not present

## 2020-06-13 DIAGNOSIS — N179 Acute kidney failure, unspecified: Secondary | ICD-10-CM

## 2020-06-13 DIAGNOSIS — L03115 Cellulitis of right lower limb: Secondary | ICD-10-CM | POA: Diagnosis present

## 2020-06-13 DIAGNOSIS — R4182 Altered mental status, unspecified: Secondary | ICD-10-CM | POA: Diagnosis not present

## 2020-06-13 DIAGNOSIS — K7030 Alcoholic cirrhosis of liver without ascites: Secondary | ICD-10-CM | POA: Diagnosis not present

## 2020-06-13 DIAGNOSIS — D61818 Other pancytopenia: Secondary | ICD-10-CM | POA: Diagnosis not present

## 2020-06-13 DIAGNOSIS — K760 Fatty (change of) liver, not elsewhere classified: Secondary | ICD-10-CM | POA: Diagnosis not present

## 2020-06-13 DIAGNOSIS — J9601 Acute respiratory failure with hypoxia: Secondary | ICD-10-CM | POA: Diagnosis present

## 2020-06-13 DIAGNOSIS — I10 Essential (primary) hypertension: Secondary | ICD-10-CM | POA: Diagnosis present

## 2020-06-13 DIAGNOSIS — E871 Hypo-osmolality and hyponatremia: Secondary | ICD-10-CM | POA: Diagnosis not present

## 2020-06-13 DIAGNOSIS — Z7189 Other specified counseling: Secondary | ICD-10-CM | POA: Diagnosis not present

## 2020-06-13 DIAGNOSIS — R6521 Severe sepsis with septic shock: Secondary | ICD-10-CM | POA: Diagnosis present

## 2020-06-13 DIAGNOSIS — E43 Unspecified severe protein-calorie malnutrition: Secondary | ICD-10-CM | POA: Diagnosis present

## 2020-06-13 DIAGNOSIS — J810 Acute pulmonary edema: Secondary | ICD-10-CM | POA: Diagnosis not present

## 2020-06-13 DIAGNOSIS — Z681 Body mass index (BMI) 19 or less, adult: Secondary | ICD-10-CM | POA: Diagnosis not present

## 2020-06-13 DIAGNOSIS — R1310 Dysphagia, unspecified: Secondary | ICD-10-CM | POA: Diagnosis not present

## 2020-06-13 DIAGNOSIS — R5383 Other fatigue: Secondary | ICD-10-CM | POA: Diagnosis not present

## 2020-06-13 DIAGNOSIS — Y92239 Unspecified place in hospital as the place of occurrence of the external cause: Secondary | ICD-10-CM | POA: Diagnosis not present

## 2020-06-13 DIAGNOSIS — G928 Other toxic encephalopathy: Secondary | ICD-10-CM | POA: Diagnosis not present

## 2020-06-13 DIAGNOSIS — Y95 Nosocomial condition: Secondary | ICD-10-CM | POA: Diagnosis not present

## 2020-06-13 DIAGNOSIS — E860 Dehydration: Secondary | ICD-10-CM | POA: Diagnosis present

## 2020-06-13 DIAGNOSIS — J90 Pleural effusion, not elsewhere classified: Secondary | ICD-10-CM | POA: Diagnosis not present

## 2020-06-13 DIAGNOSIS — J189 Pneumonia, unspecified organism: Secondary | ICD-10-CM | POA: Diagnosis not present

## 2020-06-13 DIAGNOSIS — D6959 Other secondary thrombocytopenia: Secondary | ICD-10-CM | POA: Diagnosis not present

## 2020-06-13 DIAGNOSIS — Z20822 Contact with and (suspected) exposure to covid-19: Secondary | ICD-10-CM | POA: Diagnosis not present

## 2020-06-13 DIAGNOSIS — L03119 Cellulitis of unspecified part of limb: Secondary | ICD-10-CM | POA: Diagnosis not present

## 2020-06-13 DIAGNOSIS — E861 Hypovolemia: Secondary | ICD-10-CM | POA: Diagnosis not present

## 2020-06-13 DIAGNOSIS — A419 Sepsis, unspecified organism: Secondary | ICD-10-CM | POA: Diagnosis present

## 2020-06-13 DIAGNOSIS — K224 Dyskinesia of esophagus: Secondary | ICD-10-CM | POA: Diagnosis not present

## 2020-06-13 DIAGNOSIS — Z66 Do not resuscitate: Secondary | ICD-10-CM | POA: Diagnosis not present

## 2020-06-13 DIAGNOSIS — B182 Chronic viral hepatitis C: Secondary | ICD-10-CM | POA: Diagnosis not present

## 2020-06-13 DIAGNOSIS — E876 Hypokalemia: Secondary | ICD-10-CM | POA: Diagnosis present

## 2020-06-13 DIAGNOSIS — J45909 Unspecified asthma, uncomplicated: Secondary | ICD-10-CM | POA: Diagnosis present

## 2020-06-13 DIAGNOSIS — F1110 Opioid abuse, uncomplicated: Secondary | ICD-10-CM | POA: Diagnosis present

## 2020-06-13 LAB — CBC WITH DIFFERENTIAL/PLATELET
Abs Immature Granulocytes: 0.02 10*3/uL (ref 0.00–0.07)
Basophils Absolute: 0 10*3/uL (ref 0.0–0.1)
Basophils Relative: 0 %
Eosinophils Absolute: 0 10*3/uL (ref 0.0–0.5)
Eosinophils Relative: 0 %
HCT: 30.9 % — ABNORMAL LOW (ref 39.0–52.0)
Hemoglobin: 10.3 g/dL — ABNORMAL LOW (ref 13.0–17.0)
Immature Granulocytes: 0 %
Lymphocytes Relative: 4 %
Lymphs Abs: 0.3 10*3/uL — ABNORMAL LOW (ref 0.7–4.0)
MCH: 33.7 pg (ref 26.0–34.0)
MCHC: 33.3 g/dL (ref 30.0–36.0)
MCV: 101 fL — ABNORMAL HIGH (ref 80.0–100.0)
Monocytes Absolute: 0.3 10*3/uL (ref 0.1–1.0)
Monocytes Relative: 3 %
Neutro Abs: 7.4 10*3/uL (ref 1.7–7.7)
Neutrophils Relative %: 93 %
Platelets: 237 10*3/uL (ref 150–400)
RBC: 3.06 MIL/uL — ABNORMAL LOW (ref 4.22–5.81)
RDW: 15.5 % (ref 11.5–15.5)
WBC: 8 10*3/uL (ref 4.0–10.5)
nRBC: 0 % (ref 0.0–0.2)

## 2020-06-13 LAB — I-STAT VENOUS BLOOD GAS, ED
Acid-base deficit: 12 mmol/L — ABNORMAL HIGH (ref 0.0–2.0)
Bicarbonate: 12.1 mmol/L — ABNORMAL LOW (ref 20.0–28.0)
Calcium, Ion: 0.94 mmol/L — ABNORMAL LOW (ref 1.15–1.40)
HCT: 31 % — ABNORMAL LOW (ref 39.0–52.0)
Hemoglobin: 10.5 g/dL — ABNORMAL LOW (ref 13.0–17.0)
O2 Saturation: 83 %
Potassium: 4.4 mmol/L (ref 3.5–5.1)
Sodium: 130 mmol/L — ABNORMAL LOW (ref 135–145)
TCO2: 13 mmol/L — ABNORMAL LOW (ref 22–32)
pCO2, Ven: 22.3 mmHg — ABNORMAL LOW (ref 44.0–60.0)
pH, Ven: 7.344 (ref 7.250–7.430)
pO2, Ven: 48 mmHg — ABNORMAL HIGH (ref 32.0–45.0)

## 2020-06-13 LAB — COMPREHENSIVE METABOLIC PANEL
ALT: 20 U/L (ref 0–44)
AST: 35 U/L (ref 15–41)
Albumin: 1.7 g/dL — ABNORMAL LOW (ref 3.5–5.0)
Alkaline Phosphatase: 127 U/L — ABNORMAL HIGH (ref 38–126)
Anion gap: 22 — ABNORMAL HIGH (ref 5–15)
BUN: 67 mg/dL — ABNORMAL HIGH (ref 8–23)
CO2: 13 mmol/L — ABNORMAL LOW (ref 22–32)
Calcium: 7.8 mg/dL — ABNORMAL LOW (ref 8.9–10.3)
Chloride: 96 mmol/L — ABNORMAL LOW (ref 98–111)
Creatinine, Ser: 7.2 mg/dL — ABNORMAL HIGH (ref 0.61–1.24)
GFR, Estimated: 8 mL/min — ABNORMAL LOW (ref >60)
Glucose, Bld: 125 mg/dL — ABNORMAL HIGH (ref 70–99)
Potassium: 4.6 mmol/L (ref 3.5–5.1)
Sodium: 131 mmol/L — ABNORMAL LOW (ref 135–145)
Total Bilirubin: 2 mg/dL — ABNORMAL HIGH (ref 0.3–1.2)
Total Protein: 4.6 g/dL — ABNORMAL LOW (ref 6.5–8.1)

## 2020-06-13 LAB — LACTIC ACID, PLASMA
Lactic Acid, Venous: 5.8 mmol/L (ref 0.5–1.9)
Lactic Acid, Venous: 4.1 mmol/L (ref 0.5–1.9)
Lactic Acid, Venous: 2.8 mmol/L (ref 0.5–1.9)

## 2020-06-13 LAB — SODIUM, URINE, RANDOM: Sodium, Ur: 11 mmol/L

## 2020-06-13 LAB — CBC
HCT: 23.8 % — ABNORMAL LOW (ref 39.0–52.0)
Hemoglobin: 8.6 g/dL — ABNORMAL LOW (ref 13.0–17.0)
MCH: 35 pg — ABNORMAL HIGH (ref 26.0–34.0)
MCHC: 36.1 g/dL — ABNORMAL HIGH (ref 30.0–36.0)
MCV: 96.7 fL (ref 80.0–100.0)
Platelets: 174 10*3/uL (ref 150–400)
RBC: 2.46 MIL/uL — ABNORMAL LOW (ref 4.22–5.81)
RDW: 15.5 % (ref 11.5–15.5)
WBC: 6.9 10*3/uL (ref 4.0–10.5)
nRBC: 0 % (ref 0.0–0.2)

## 2020-06-13 LAB — MAGNESIUM: Magnesium: 1.6 mg/dL — ABNORMAL LOW (ref 1.7–2.4)

## 2020-06-13 LAB — RESP PANEL BY RT-PCR (FLU A&B, COVID) ARPGX2
Influenza A by PCR: NEGATIVE
Influenza B by PCR: NEGATIVE
SARS Coronavirus 2 by RT PCR: NEGATIVE

## 2020-06-13 LAB — RAPID URINE DRUG SCREEN, HOSP PERFORMED
Amphetamines: NOT DETECTED
Barbiturates: NOT DETECTED
Benzodiazepines: NOT DETECTED
Cocaine: NOT DETECTED
Opiates: NOT DETECTED
Tetrahydrocannabinol: POSITIVE — AB

## 2020-06-13 LAB — TSH: TSH: 2.068 u[IU]/mL (ref 0.350–4.500)

## 2020-06-13 LAB — BASIC METABOLIC PANEL
Anion gap: 15 (ref 5–15)
BUN: 63 mg/dL — ABNORMAL HIGH (ref 8–23)
CO2: 21 mmol/L — ABNORMAL LOW (ref 22–32)
Calcium: 7 mg/dL — ABNORMAL LOW (ref 8.9–10.3)
Chloride: 98 mmol/L (ref 98–111)
Creatinine, Ser: 5.68 mg/dL — ABNORMAL HIGH (ref 0.61–1.24)
GFR, Estimated: 10 mL/min — ABNORMAL LOW (ref >60)
Glucose, Bld: 69 mg/dL — ABNORMAL LOW (ref 70–99)
Potassium: 3.4 mmol/L — ABNORMAL LOW (ref 3.5–5.1)
Sodium: 134 mmol/L — ABNORMAL LOW (ref 135–145)

## 2020-06-13 LAB — CREATININE, SERUM
Creatinine, Ser: 6.27 mg/dL — ABNORMAL HIGH (ref 0.61–1.24)
GFR, Estimated: 9 mL/min — ABNORMAL LOW (ref >60)

## 2020-06-13 LAB — URINALYSIS, COMPLETE (UACMP) WITH MICROSCOPIC
Bacteria, UA: NONE SEEN
Bilirubin Urine: NEGATIVE
Glucose, UA: NEGATIVE mg/dL
Ketones, ur: NEGATIVE mg/dL
Leukocytes,Ua: NEGATIVE
Nitrite: NEGATIVE
Protein, ur: 30 mg/dL — AB
Specific Gravity, Urine: 1.016 (ref 1.005–1.030)
pH: 5 (ref 5.0–8.0)

## 2020-06-13 LAB — GLUCOSE, CAPILLARY
Glucose-Capillary: 186 mg/dL — ABNORMAL HIGH (ref 70–99)
Glucose-Capillary: 62 mg/dL — ABNORMAL LOW (ref 70–99)

## 2020-06-13 LAB — CBG MONITORING, ED: Glucose-Capillary: 66 mg/dL — ABNORMAL LOW (ref 70–99)

## 2020-06-13 LAB — AMMONIA: Ammonia: 48 umol/L — ABNORMAL HIGH (ref 9–35)

## 2020-06-13 LAB — BRAIN NATRIURETIC PEPTIDE: B Natriuretic Peptide: 471.1 pg/mL — ABNORMAL HIGH (ref 0.0–100.0)

## 2020-06-13 LAB — LIPASE, BLOOD: Lipase: 16 U/L (ref 11–51)

## 2020-06-13 LAB — CREATININE, URINE, RANDOM: Creatinine, Urine: 147.84 mg/dL

## 2020-06-13 LAB — T4, FREE: Free T4: 0.93 ng/dL (ref 0.61–1.12)

## 2020-06-13 LAB — HIV ANTIBODY (ROUTINE TESTING W REFLEX): HIV Screen 4th Generation wRfx: NONREACTIVE

## 2020-06-13 LAB — ETHANOL: Alcohol, Ethyl (B): <10 mg/dL (ref <10)

## 2020-06-13 LAB — CK: Total CK: 208 U/L (ref 49–397)

## 2020-06-13 LAB — TROPONIN I (HIGH SENSITIVITY): Troponin I (High Sensitivity): 34 ng/L — ABNORMAL HIGH (ref <18)

## 2020-06-13 MED ORDER — MAGNESIUM SULFATE IN D5W 1-5 GM/100ML-% IV SOLN
1.0000 g | Freq: Once | INTRAVENOUS | Status: AC
  Administered 2020-06-13: 1 g via INTRAVENOUS
  Filled 2020-06-13: qty 100

## 2020-06-13 MED ORDER — DEXTROSE 50 % IV SOLN
50.0000 mL | Freq: Once | INTRAVENOUS | Status: AC
  Administered 2020-06-13: 50 mL via INTRAVENOUS

## 2020-06-13 MED ORDER — DOCUSATE SODIUM 100 MG PO CAPS: 100.0000 mg | ORAL_CAPSULE | Freq: Two times a day (BID) | ORAL | Status: DC | PRN

## 2020-06-13 MED ORDER — LACTATED RINGERS IV SOLN: INTRAVENOUS | Status: DC

## 2020-06-13 MED ORDER — LACTATED RINGERS IV BOLUS
1000.0000 mL | Freq: Once | INTRAVENOUS | Status: AC
  Administered 2020-06-13: 1000 mL via INTRAVENOUS

## 2020-06-13 MED ORDER — SODIUM CHLORIDE 0.9 % IV SOLN: INTRAVENOUS | Status: DC

## 2020-06-13 MED ORDER — PANTOPRAZOLE SODIUM 40 MG IV SOLR
40.0000 mg | Freq: Every day | INTRAVENOUS | Status: DC
  Administered 2020-06-13 – 2020-06-16 (×4): 40 mg via INTRAVENOUS
  Filled 2020-06-13 (×4): qty 40

## 2020-06-13 MED ORDER — VANCOMYCIN HCL IN DEXTROSE 1-5 GM/200ML-% IV SOLN: 1000.0000 mg | Freq: Once | INTRAVENOUS | Status: DC

## 2020-06-13 MED ORDER — VANCOMYCIN HCL 1250 MG/250ML IV SOLN
1250.0000 mg | Freq: Once | INTRAVENOUS | Status: AC
  Administered 2020-06-13: 1250 mg via INTRAVENOUS
  Filled 2020-06-13: qty 250

## 2020-06-13 MED ORDER — ALBUMIN HUMAN 25 % IV SOLN
25.0000 g | Freq: Four times a day (QID) | INTRAVENOUS | Status: AC
  Administered 2020-06-13: 25 g via INTRAVENOUS
  Filled 2020-06-13 (×3): qty 100

## 2020-06-13 MED ORDER — POLYETHYLENE GLYCOL 3350 17 G PO PACK: 17.0000 g | PACK | Freq: Every day | ORAL | Status: DC | PRN

## 2020-06-13 MED ORDER — LACTATED RINGERS IV BOLUS (SEPSIS)
1000.0000 mL | Freq: Once | INTRAVENOUS | Status: AC
  Administered 2020-06-12: 1000 mL via INTRAVENOUS

## 2020-06-13 MED ORDER — NOREPINEPHRINE 4 MG/250ML-% IV SOLN
2.0000 ug/min | INTRAVENOUS | Status: DC
  Administered 2020-06-13 – 2020-06-16 (×3): 2 ug/min via INTRAVENOUS
  Filled 2020-06-13 (×3): qty 250

## 2020-06-13 MED ORDER — MOMETASONE FURO-FORMOTEROL FUM 100-5 MCG/ACT IN AERO
2.0000 | INHALATION_SPRAY | Freq: Two times a day (BID) | RESPIRATORY_TRACT | Status: DC
  Administered 2020-06-15 – 2020-06-20 (×9): 2 via RESPIRATORY_TRACT
  Filled 2020-06-13 (×2): qty 8.8

## 2020-06-13 MED ORDER — SODIUM CHLORIDE 0.9 % IV SOLN
2.0000 g | Freq: Once | INTRAVENOUS | Status: AC
  Administered 2020-06-13: 2 g via INTRAVENOUS
  Filled 2020-06-13: qty 2

## 2020-06-13 MED ORDER — DEXTROSE 5 % IV SOLN
0.5000 g | Freq: Three times a day (TID) | INTRAVENOUS | Status: DC
  Administered 2020-06-13 – 2020-06-15 (×7): 0.5 g via INTRAVENOUS
  Filled 2020-06-13 (×9): qty 0.5

## 2020-06-13 MED ORDER — NOREPINEPHRINE 4 MG/250ML-% IV SOLN: 0.0000 ug/min | INTRAVENOUS | Status: DC

## 2020-06-13 MED ORDER — POTASSIUM CHLORIDE 10 MEQ/100ML IV SOLN
10.0000 meq | INTRAVENOUS | Status: AC
  Administered 2020-06-13 – 2020-06-14 (×2): 10 meq via INTRAVENOUS
  Filled 2020-06-13 (×2): qty 100

## 2020-06-13 MED ORDER — IPRATROPIUM-ALBUTEROL 0.5-2.5 (3) MG/3ML IN SOLN: 3.0000 mL | RESPIRATORY_TRACT | Status: DC | PRN

## 2020-06-13 MED ORDER — HEPARIN SODIUM (PORCINE) 5000 UNIT/ML IJ SOLN
5000.0000 [IU] | Freq: Three times a day (TID) | INTRAMUSCULAR | Status: DC
  Administered 2020-06-13 – 2020-06-19 (×18): 5000 [IU] via SUBCUTANEOUS
  Filled 2020-06-13 (×18): qty 1

## 2020-06-13 MED ORDER — VANCOMYCIN VARIABLE DOSE PER UNSTABLE RENAL FUNCTION (PHARMACIST DOSING): Status: DC

## 2020-06-13 MED ORDER — SODIUM CHLORIDE 0.9 % IV SOLN
250.0000 mL | INTRAVENOUS | Status: DC
  Administered 2020-06-13 – 2020-06-18 (×3): 250 mL via INTRAVENOUS

## 2020-06-13 MED ORDER — DEXTROSE 50 % IV SOLN
INTRAVENOUS | Status: AC
  Filled 2020-06-13: qty 50

## 2020-06-13 MED ORDER — CALCIUM GLUCONATE-NACL 1-0.675 GM/50ML-% IV SOLN
1.0000 g | Freq: Once | INTRAVENOUS | Status: AC
  Administered 2020-06-13: 1000 mg via INTRAVENOUS
  Filled 2020-06-13: qty 50

## 2020-06-13 MED ORDER — METRONIDAZOLE IN NACL 5-0.79 MG/ML-% IV SOLN
500.0000 mg | Freq: Once | INTRAVENOUS | Status: AC
  Administered 2020-06-13: 500 mg via INTRAVENOUS
  Filled 2020-06-13: qty 100

## 2020-06-13 MED ORDER — LACTATED RINGERS IV BOLUS (SEPSIS)
1000.0000 mL | Freq: Once | INTRAVENOUS | Status: AC
  Administered 2020-06-13: 1000 mL via INTRAVENOUS

## 2020-06-13 MED ORDER — SODIUM BICARBONATE 8.4 % IV SOLN
INTRAVENOUS | Status: AC
  Filled 2020-06-13 (×3): qty 850

## 2020-06-13 MED ORDER — FENTANYL CITRATE (PF) 100 MCG/2ML IJ SOLN
25.0000 ug | INTRAMUSCULAR | Status: DC | PRN
Start: 2020-06-13 — End: 2020-06-16

## 2020-06-13 NOTE — H&P (Signed)
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  I have reviewed prior notes from infectious disease, GI, cardiology.  Cardiology work-up has shown normal LVEF and normal right heart pressures.  He is maintained on torsemide and Aldactone.  Last creatinine 03/2020 was normal. For the last 2 weeks he has developed nausea and vomiting and was unable to take much orally.  He reports drinking Smirnoff lites but for the past month has changed to a shot of brandy every morning to prevent shakes.  For the past few days he was unable to take this.  Developed dyspnea which brought him to the emergency room found to have AKI and hypotension, blood pressure improved with 3 L of fluids and has not required pressors so far. Received empiric antibiotics for lactate of 5.8 with aztreonam/vancomycin/Flagyl PCCM consulted for further management  Past Medical History:  Hep C/EtOH cirrhosis -no varices on last EGD, no overt hepatic encephalopathy Chronic hypoalbuminemia with pedal edema Normal LV function, normal right heart pressures EtOH use disorder Opiate use disorder, on Suboxone Asthma  Significant Hospital Events:    Consults:  renal  Procedures:    Significant Diagnostic Tests:  Renal US 1/8 >> [place] abd 1/8 >>   Micro Data:  BC 1/8>> Urine 1/8 >>   Antimicrobials:  Aztreonam 1/8 >> Vancomycin 1/8>> Flagyl 1/8  Interim History / Subjective:    Objective   Blood pressure (!) 85/65, pulse (!) 116,  temperature 98 F (36.7 C), temperature source Oral, resp. rate 15, height 5\' 10"  (1.778 m), weight 61 kg, SpO2 100 %.       No intake or output data in the 24 hours ending 06/13/20 0853 Filed Weights   06/28/2020 2320  Weight: 61 kg    Examination: General: Chronically ill-appearing, weak man, supine in ED stretcher, mild distress HENT: Mild pallor, no icterus, dry oral mucosa Lungs: Faint rhonchi scattered, no accessory muscle use Cardiovascular: S1-S2 normal, no rub Abdomen: Soft mildly distended, no fluid thrill Extremities: Erythema up to mid shin, 1+ edema, mild tenderness, no skip lesions , gangrenous 1 inch area over the left third toe Neuro: Awake oriented x3, mild tremors, nonfocal GU: No Foley  Resolved Hospital Problem list     Assessment & Plan:  AKI and hypotension likely hypovolemia , but possible component of sepsis causing lactic acidosis, likely source being cellulitis both feet, no skip lesions so no evidence of necrotizing fasciitis   Septic shock -empiric aztreonam/vancomycin to cover cellulitis Repeat lactate is decreasing which is reassuring, will follow. Continue fluids, prefer normal saline at 125/hour, will add bicarb drip at 50/hour Current MAP acceptable but if drops less than 65 can use peripheral Levophed to achieve that goal Previous LV function recorded normal  AKI -likely hypovolemia induced from overdiuresis and GI losses. Check urine lites and urinalysis to rule out hep C-induced cryoglobulinemia , doubt hepatorenal Trend bmet, no indication for dialysis at this time but will follow. Renal consultation Renal ultrasound Hypocalcemia and hypomagnesemia will be repleted  Chronic pedal edema -previous work-up  includes normal LV function, normal right heart pressures, attributed to hypoalbuminemia, TSH normal , venous duplex - 12/2019 -Hold diuretics  EtOH/hep C related cirrhosis -Zofran as needed nausea -Ultrasound abdomen -Protonix  Asthma  -instead of Flovent and albuterol, will use Dulera while in-house Albuterol nebs as needed  Pain/chronic opiate use disorder on Suboxone -Use fentanyl as needed. At some point, can restart Suboxone     Best practice (evaluated daily)  Diet: Clear liquids Pain/Anxiety/Delirium protocol (if indicated): Fentanyl as needed VAP protocol (if indicated): N/A DVT prophylaxis: Subcu heparin GI prophylaxis: N/A Glucose control: N/A Mobility: Bedrest Disposition: ICU  Goals of Care:  Last date of multidisciplinary goals of care discussion:NA Family and staff present: NA Summary of discussion: NA Follow up goals of care discussion due: NA Code Status: Full  Labs   CBC: Recent Labs  Lab 06/13/20 0141 06/13/20 0154  WBC 8.0  --   NEUTROABS 7.4  --   HGB 10.3* 10.5*  HCT 30.9* 31.0*  MCV 101.0*  --   PLT 237  --     Basic Metabolic Panel: Recent Labs  Lab 06/13/20 0141 06/13/20 0154  NA 131* 130*  K 4.6 4.4  CL 96*  --   CO2 13*  --   GLUCOSE 125*  --   BUN 67*  --   CREATININE 7.20*  --   CALCIUM 7.8*  --   MG 1.6*  --    GFR: Estimated Creatinine Clearance: 8.6 mL/min (A) (by C-G formula based on SCr of 7.2 mg/dL (H)). Recent Labs  Lab 06/13/20 0141 06/13/20 0425  WBC 8.0  --   LATICACIDVEN 5.8* 4.1*    Liver Function Tests: Recent Labs  Lab 06/13/20 0141  AST 35  ALT 20  ALKPHOS 127*  BILITOT 2.0*  PROT 4.6*  ALBUMIN 1.7*   No results for input(s): LIPASE, AMYLASE in the last 168 hours. Recent Labs  Lab 06/13/20 0141  AMMONIA 48*    ABG    Component Value Date/Time   HCO3 12.1 (L) 06/13/2020 0154   TCO2 13 (L) 06/13/2020 0154   ACIDBASEDEF 12.0 (H) 06/13/2020 0154   O2SAT 83.0 06/13/2020 0154     Coagulation Profile: No results for input(s): INR, PROTIME in the last 168 hours.  Cardiac Enzymes: No results for input(s): CKTOTAL, CKMB, CKMBINDEX, TROPONINI in the last 168 hours.  HbA1C: No results found for: HGBA1C  CBG: Recent  Labs  Lab 06/18/2020 2316  GLUCAP 124*    Review of Systems:   Pain in lower extremities Nausea, vomiting, unable to take p.o. Generalized weakness No fevers, chills Regarding micturition Withdrawal symptoms every morning "shakes"  Past Medical History:  He,  has a past medical history of Abnormal CXR (09/16/2016), Alcohol dependence with unspecified alcohol-induced disorder (HCC), Alcoholic hepatitis without ascites, ALLERGIC RHINITIS (09/14/2006), Anxiety, Asthma, chronic, severe persistent, uncomplicated (09/14/2006), BPH (benign prostatic hyperplasia), Concentric left ventricular hypertrophy, DEPRESSION (09/14/2006), Echocardiogram, GERD (gastroesophageal reflux disease), Gout (03/28/2012), Hepatitis C, History of nuclear stress test, History of paroxysmal supraventricular tachycardia, Hypertension, Hypoalbuminemia, Hypokalemia, Nausea, Opioid abuse (HCC), Simple chronic bronchitis (HCC), SVT (supraventricular tachycardia) (HCC), SVT (supraventricular tachycardia) (HCC) (08/12/2018), and Vertigo.   Surgical History:   Past Surgical History:  Procedure Laterality Date  . polyps remvoed from nose    . RIGHT HEART CATH N/A 03/13/2020   Procedure: RIGHT HEART CATH;  Surgeon: Yvonne Kendall, MD;  Location: [facility]VASIVE [facility];  Service: Cardiovascular;  Laterality: N/A;  . UPPER GI ENDOSCOPY  approx 10 years     Social History:   reports that he has never smoked. He has never used smokeless tobacco. He reports current alcohol use of about 3.0 standard drinks of alcohol per week. He reports current drug use. Frequency: 7.00 times per week. Drug: Marijuana.   Family History:  His family history includes Asthma in his brother; Heart disease in his brother and father; Heart murmur in his mother; Liver cancer in his brother. There is no history of Colon cancer, Esophageal cancer, Rectal cancer, or Stomach cancer.   Allergies Allergies  Allergen Reactions  . Aspirin Shortness Of Breath and  Swelling    REACTION: ASTHMA  . Penicillins Swelling    Makes lips and tongue swell up Has patient had a PCN reaction causing immediate rash, facial/tongue/throat swelling, SOB or lightheadedness with hypotension: yes Has patient had a PCN reaction causing severe rash involving mucus membranes or skin necrosis: no Has patient had a PCN reaction that required hospitalization: no Has patient had a PCN reaction occurring within the last 10 years: no, a little over 10 years If all of the above answers are "NO", then may proceed with Cephalosporin use.      Home Medications  Prior to Admission medications   Medication Sig Start Date End Date Taking? Authorizing Provider  albuterol (PROAIR HFA) 108 (90 Base) MCG/ACT inhaler Inhale 2 puffs into the lungs every 6 (six) hours as needed for wheezing or shortness of breath. 02/25/20  Yes Spero Geralds, MD  albuterol (PROVENTIL) (2.5 MG/3ML) 0.083% nebulizer solution Take 3 mLs (2.5 mg total) by nebulization every 6 (six) hours as needed for wheezing or shortness of breath. 09/15/16  Yes Tanda Rockers, MD  Buprenorphine HCl-Naloxone HCl 8-2 MG FILM Place 1 Film under the tongue every other day.  08/06/18  Yes [provider]  diphenhydrAMINE (BENADRYL) 25 MG tablet Take 25 mg by mouth every 6 (six) hours as needed for itching or allergies.   Yes [provider]  FLUoxetine (PROZAC) 20 MG capsule Take 20 mg by mouth daily.   Yes [provider]  fluticasone (FLONASE) 50 MCG/ACT nasal spray Place 1 spray into both nostrils daily. 02/25/20  Yes Spero Geralds, MD  fluticasone (FLOVENT HFA) 110 MCG/ACT inhaler Inhale 2 puffs into the lungs at bedtime. 02/25/20  Yes Spero Geralds, MD  Nebulizers (NEBULIZER COMPRESSOR) MISC Use as directed Dx: 01/10/17  Yes Tanda Rockers, MD  ondansetron (ZOFRAN) 4 MG tablet Take 1 tablet (4 mg total) by mouth every 6 (six) hours. Patient taking differently: Take 4 mg by mouth every 8 (eight) hours  as needed for nausea. 09/15/16  Yes Tanda Rockers, MD  pantoprazole (PROTONIX) 40 MG tablet TAKE 1 TABLET (40 MG TOTAL) BY MOUTH DAILY. TAKE 30-60 MIN BEFORE FIRST MEAL OF THE DAY Patient taking differently: Take 40 mg by mouth at bedtime. Take 30-60 min before first meal of the day 07/07/17  Yes Tanda Rockers, MD  potassium chloride 20 MEQ/15ML (10%) SOLN Take 15 mLs (20 mEq total) by mouth daily. 02/19/20  Yes Richardson Dopp T, PA-C  Spacer/Aero-Holding Chambers (AEROCHAMBER MV) inhaler Use as instructed 10/28/19  Yes Spero Geralds, MD  spironolactone (ALDACTONE) 100 MG tablet Take 100 mg by mouth daily.  10/17/19  Yes [provider]  torsemide (DEMADEX) 20 MG tablet Take 1 tablet (20 mg total) by mouth daily. 01/17/20  Yes Richardson Dopp T, PA-C     Critical care time:  59m     Kara Mead MD. [facility] Flood. [facility] See Amion for pager  If no response to pager , please call [phone]  After 7:00 pm call Elink  [phone]   06/13/2020

## 2020-06-13 NOTE — Progress Notes (Signed)
[facility] Progress Note Patient Name: Gabriel Ramos DOB: [birth year] MRN: [id number]   Date of Service  06/13/2020  HPI/Events of Note  New patient evaluation. 5M with hx of active EtOH abuse, EtOH/HCV cirrhosis who p/w n/v for 2 weeks. Found to be hypotensive with an AKI. Hypotension responded to 3L IVF initially, but in ICU he is now requiring levophed at 2 mcg/min (0.03 mcg/kg/min) to maintain BP of 90/64 (MAP 72). He is on vanc/aztreonam empirically for presumed sepsis (severe PCN allergy).  Labs notable for WBC 8k, BUN 67, Cr 7.2, HCO3 13, AG 22, lactate 5.8. He was started on a bicarb drip at 75 cc/hr + NS at 75cc/hr. VBG 7.34/22/--/13/BE -12. Lactate downtrending (now 2.8 on most recent labs). Repeat BMP pending.  On my exam, patient is sleeping. SpO2 on 4L [place] is 100%. BP 90/64 (72), HR 100-106 bpm (ST). No fevers since admission. Not in any respiratory distress.   eICU Interventions   # Cardiac: - Shock: Levophed as needed to maintain MAP 65. Continue with isotonic fluid at 150cc/hr cumulatively.  # Respiratory: - Patient has appropriate respiratory compensation for his metabolic acidosis. No respiratory distress at this time.  # Renal: - AKI + Metabolic acidosis: Bicarb drip at 75cc/hr + NS at 75cc/hr. F/u 1900 hrs BMP. S/p volume resuscitation.  # ID: - Empiric aztreonam and vancomycin for now, though likely will de-escalate in coming days if cultures negative. - No ascites on U/S to tap. - F/u cultures.  # GI: - N/V: NPO. Zofran.  DVT PPX: Heparin [place] ppx GI PPX: Protonix (home med)      Intervention Category Evaluation Type: New Patient Evaluation  Gabriel Ramos 06/13/2020, 7:43 PM

## 2020-06-13 NOTE — ED Provider Notes (Incomplete)
[facility] EMERGENCY DEPARTMENT Provider Note   CSN: [id number] Arrival date & time: 06/25/2020  2243     History Chief Complaint  Patient presents with  . Bradycardia    Gabriel Ramos is a 70 y.o. male.  70 year old male with medical problems as documented below who presents to the emergency department today for lethargy.  The history is not entirely clear.  Sounds like he has been feeling bad for a few days but ultimately states that he has been in bed for a week.  No known illnesses.  States that he quit drinking alcohol a week ago.  Patient states he has been nauseous since that time but no throwing up.  Has had some diarrhea.  No other drugs.  There was some questionable bradycardia in route and was being paced apparently but not now.  From wife: diagnosed with hepatitis C recently, always nauseous --> has had EGD, cardiac workup, colonoscopy --> fatty liver --> over last few weeks has had progressively worsening edema intermittently worsening and in the last couple days became even more nauseous and decreased appetite, today fatigued and continued nauseous.         Past Medical History:  Diagnosis Date  . Abnormal CXR 09/16/2016   Report 09/07/16 c/w granuloma  . Alcohol dependence with unspecified alcohol-induced disorder ([place])   . Alcoholic hepatitis without ascites   . ALLERGIC RHINITIS 09/14/2006   Qualifier: Diagnosis of  By: [name]    . Anxiety   . Asthma, chronic, severe persistent, uncomplicated 0000000   Allergy profile 09/15/2016 >  Eos 0.5/  IgE  271 Grass > dog  - 09/15/2016    try symbicort 160 2bid  - 10/17/2016  After extensive coaching HFA effectiveness =    75%  (ti too short)  - sinus CT 03/02/2017 >  Pos polyp    . BPH (benign prostatic hyperplasia)   . Concentric left ventricular hypertrophy   . DEPRESSION 09/14/2006   Qualifier: Diagnosis of  By: [name]    . Echocardiogram    Echocardiogram 9/21: EF 60-65, no RWMA, mild  LVH, Gr 1 DD, normal RVSF, RVSP 23.1, trivial MR  . GERD (gastroesophageal reflux disease)   . Gout 03/28/2012  . Hepatitis C   . History of nuclear stress test    Myoview 9/21: EF 84, normal perfusion; low risk  . History of paroxysmal supraventricular tachycardia   . Hypertension   . Hypoalbuminemia   . Hypokalemia   . Nausea   . Opioid abuse ([place])   . Simple chronic bronchitis ([place])   . SVT (supraventricular tachycardia) ([place])    2013,echo [place] NI LV EF ,mild MR: nl stress cl 3/13  . SVT (supraventricular tachycardia) ([place]) 08/12/2018   Cardiac work-up in 2013 normal  . Vertigo     Patient Active Problem List   Diagnosis Date Noted  . Edema 03/13/2020  . Shortness of breath   . Chronic hepatitis C without hepatic coma ([place]) 08/07/2019  . Palpitations 08/12/2018  . SVT (supraventricular tachycardia) ([place]) 08/12/2018  . Upper respiratory infection 10/17/2016  . Essential hypertension 09/16/2016  . Abnormal CXR 09/16/2016  . Gout 03/28/2012  . DEPRESSION 09/14/2006  . ALLERGIC RHINITIS 09/14/2006  . Asthma, chronic, severe persistent, uncomplicated [id number]    Past Surgical History:  Procedure Laterality Date  . polyps remvoed from nose    . RIGHT HEART CATH N/A 03/13/2020   Procedure: RIGHT HEART CATH;  Surgeon: Nelva Bush, MD;  Location: [facility];  Service: Cardiovascular;  Laterality: N/A;  . UPPER GI ENDOSCOPY     approx 10 years       Family History  Problem Relation Age of Onset  . Heart murmur Mother   . Heart disease Father   . Heart disease Brother   . Asthma Brother   . Liver cancer Brother        Stage 4   . Colon cancer Neg Hx   . Esophageal cancer Neg Hx   . Rectal cancer Neg Hx   . Stomach cancer Neg Hx     Social History   Tobacco Use  . Smoking status: Never Smoker  . Smokeless tobacco: Never Used  Vaping Use  . Vaping Use: Never used  Substance Use Topics  . Alcohol use: Yes    Alcohol/week: 3.0 standard drinks     Types: 3 Shots of liquor per week    Comment: smirnoffs, BRANDY BOTTLE EVERY OTHER DAY  . Drug use: Yes    Frequency: 7.0 times per week    Types: Marijuana    Comment: this morning 08/27/19    Home Medications Prior to Admission medications   Medication Sig Start Date End Date Taking? Authorizing Provider  albuterol (PROAIR HFA) 108 (90 Base) MCG/ACT inhaler Inhale 2 puffs into the lungs every 6 (six) hours as needed for wheezing or shortness of breath. 02/25/20   Spero Geralds, MD  albuterol (PROVENTIL) (2.5 MG/3ML) 0.083% nebulizer solution Take 3 mLs (2.5 mg total) by nebulization every 6 (six) hours as needed for wheezing or shortness of breath. 09/15/16   Tanda Rockers, MD  Buprenorphine HCl-Naloxone HCl 8-2 MG FILM Place 1 Film under the tongue every other day.  08/06/18   [provider]  diphenhydrAMINE (BENADRYL) 25 MG tablet Take 25 mg by mouth every 6 (six) hours as needed.    [provider]  FLUoxetine (PROZAC) 20 MG capsule Take 20 mg by mouth daily.    [provider]  fluticasone (FLONASE) 50 MCG/ACT nasal spray Place 1 spray into both nostrils daily. 02/25/20   Spero Geralds, MD  fluticasone (FLOVENT HFA) 110 MCG/ACT inhaler Inhale 2 puffs into the lungs at bedtime. 02/25/20   Spero Geralds, MD  Nebulizers (NEBULIZER COMPRESSOR) MISC Use as directed Dx: 01/10/17   Tanda Rockers, MD  ondansetron (ZOFRAN) 4 MG tablet Take 1 tablet (4 mg total) by mouth every 6 (six) hours. Patient taking differently: Take 4 mg by mouth every 8 (eight) hours as needed for nausea.  09/15/16   Tanda Rockers, MD  pantoprazole (PROTONIX) 40 MG tablet TAKE 1 TABLET (40 MG TOTAL) BY MOUTH DAILY. TAKE 30-60 MIN BEFORE FIRST MEAL OF THE DAY Patient taking differently: Take 40 mg by mouth at bedtime. Take 30-60 min before first meal of the day 07/07/17   Tanda Rockers, MD  potassium chloride 20 MEQ/15ML (10%) SOLN Take 15 mLs (20 mEq total) by mouth daily. 02/19/20    Richardson Dopp T, PA-C  Spacer/Aero-Holding Chambers (AEROCHAMBER MV) inhaler Use as instructed 10/28/19   Spero Geralds, MD  spironolactone (ALDACTONE) 100 MG tablet Take 100 mg by mouth daily.  10/17/19   [provider]  torsemide (DEMADEX) 20 MG tablet Take 1 tablet (20 mg total) by mouth daily. 01/17/20   Richardson Dopp T, PA-C    Allergies    Aspirin and Penicillins  Review of Systems   Review of Systems  All other systems reviewed and are negative.   Physical Exam Updated Vital Signs BP 104/67 (BP Location: Right Arm)   Pulse 97   Temp (!) 93.1 F (33.9 C) (Rectal)   Resp 20   Ht 5\' 10"  (1.778 m)   Wt 61 kg   SpO2 100%   BMI 19.30 kg/m   Physical Exam Vitals and nursing note reviewed.  Constitutional:      Appearance: He is well-developed and well-nourished. He is ill-appearing.  HENT:     Head: Normocephalic and atraumatic.     Nose: Nose normal. No congestion or rhinorrhea.     Mouth/Throat:     Mouth: Mucous membranes are moist.     Pharynx: Oropharynx is clear.  Eyes:     General: Scleral icterus present.     Pupils: Pupils are equal, round, and reactive to light.  Cardiovascular:     Rate and Rhythm: Normal rate.  Pulmonary:     Effort: Pulmonary effort is normal. No respiratory distress.  Abdominal:     General: Abdomen is flat. There is no distension.  Musculoskeletal:        General: Normal range of motion.     Cervical back: Normal range of motion.  Skin:    General: Skin is warm and dry.     Coloration: Skin is jaundiced.     Findings: Rash (diffuse erythematous rash. also with significant erythema to both legs. questionable embolic wounds to toes. sacral wound with surrounding erythema) present.  Neurological:     General: No focal deficit present.     Mental Status: He is alert.     ED Results / Procedures / Treatments   Labs (all labs ordered are listed, but only abnormal results are displayed) Labs Reviewed  CBC WITH  DIFFERENTIAL/PLATELET - Abnormal; Notable for the following components:      Result Value   RBC 3.06 (*)    Hemoglobin 10.3 (*)    HCT 30.9 (*)    MCV 101.0 (*)    Lymphs Abs 0.3 (*)    All other components within normal limits  COMPREHENSIVE METABOLIC PANEL - Abnormal; Notable for the following components:   Sodium 131 (*)    Chloride 96 (*)    CO2 13 (*)    Glucose, Bld 125 (*)    BUN 67 (*)    Creatinine, Ser 7.20 (*)    Calcium 7.8 (*)    Total Protein 4.6 (*)    Albumin 1.7 (*)    Alkaline Phosphatase 127 (*)    Total Bilirubin 2.0 (*)    GFR, Estimated 8 (*)    Anion gap 22 (*)    All other components within normal limits  LACTIC ACID, PLASMA - Abnormal; Notable for the following components:   Lactic Acid, Venous 5.8 (*)    All other components within normal limits  AMMONIA - Abnormal; Notable for the following components:   Ammonia 48 (*)    All other components within normal limits  RAPID URINE DRUG SCREEN, HOSP PERFORMED - Abnormal; Notable for the following components:   Tetrahydrocannabinol POSITIVE (*)    All other components within normal limits  URINALYSIS, COMPLETE (UACMP) WITH MICROSCOPIC - Abnormal; Notable for the following components:   Color, Urine AMBER (*)    APPearance HAZY (*)    Hgb urine dipstick SMALL (*)    Protein, ur 30 (*)    All other components within normal limits  MAGNESIUM - Abnormal; Notable for the  following components:   Magnesium 1.6 (*)    All other components within normal limits  LACTIC ACID, PLASMA - Abnormal; Notable for the following components:   Lactic Acid, Venous 4.1 (*)    All other components within normal limits  LACTIC ACID, PLASMA - Abnormal; Notable for the following components:   Lactic Acid, Venous 2.8 (*)    All other components within normal limits  BRAIN NATRIURETIC PEPTIDE - Abnormal; Notable for the following components:   B Natriuretic Peptide 471.1 (*)    All other components within normal limits  CBC -  Abnormal; Notable for the following components:   RBC 2.46 (*)    Hemoglobin 8.6 (*)    HCT 23.8 (*)    MCH 35.0 (*)    MCHC 36.1 (*)    All other components within normal limits  CREATININE, SERUM - Abnormal; Notable for the following components:   Creatinine, Ser 6.27 (*)    GFR, Estimated 9 (*)    All other components within normal limits  BASIC METABOLIC PANEL - Abnormal; Notable for the following components:   Sodium 134 (*)    Potassium 3.4 (*)    CO2 21 (*)    Glucose, Bld 69 (*)    BUN 63 (*)    Creatinine, Ser 5.68 (*)    Calcium 7.0 (*)    GFR, Estimated 10 (*)    All other components within normal limits  GLUCOSE, CAPILLARY - Abnormal; Notable for the following components:   Glucose-Capillary 62 (*)    All other components within normal limits  GLUCOSE, CAPILLARY - Abnormal; Notable for the following components:   Glucose-Capillary 186 (*)    All other components within normal limits  CBG MONITORING, ED - Abnormal; Notable for the following components:   Glucose-Capillary 124 (*)    All other components within normal limits  CBG MONITORING, ED - Abnormal; Notable for the following components:   Glucose-Capillary 66 (*)    All other components within normal limits  I-STAT VENOUS BLOOD GAS, ED - Abnormal; Notable for the following components:   pCO2, Ven 22.3 (*)    pO2, Ven 48.0 (*)    Bicarbonate 12.1 (*)    TCO2 13 (*)    Acid-base deficit 12.0 (*)    Sodium 130 (*)    Calcium, Ion 0.94 (*)    HCT 31.0 (*)    Hemoglobin 10.5 (*)    All other components within normal limits  TROPONIN I (HIGH SENSITIVITY) - Abnormal; Notable for the following components:   Troponin I (High Sensitivity) 34 (*)    All other components within normal limits  RESP PANEL BY RT-PCR (FLU A&B, COVID) ARPGX2  CULTURE, BLOOD (ROUTINE X 2)  CULTURE, BLOOD (ROUTINE X 2)  URINE CULTURE  ETHANOL  TSH  T4, FREE  HIV ANTIBODY (ROUTINE TESTING W REFLEX)  SODIUM, URINE, RANDOM   CREATININE, URINE, RANDOM  LIPASE, BLOOD  CK  HCV RNA QUANT  RHEUMATOID FACTOR  CBC  BASIC METABOLIC PANEL  MAGNESIUM  PHOSPHORUS    EKG None  Radiology CT HEAD WO CONTRAST  Result Date: 06/13/2020 CLINICAL DATA:  Altered mental status.  Lethargy. EXAM: CT HEAD WITHOUT CONTRAST TECHNIQUE: Contiguous axial images were obtained from the base of the skull through the vertex without intravenous contrast. COMPARISON:  Head CT 06/22/2015 FINDINGS: Brain: Generalized atrophy is slightly advanced for age. Mild periventricular and deep white matter hypodensity typical of chronic small vessel ischemia. No intracranial hemorrhage, mass effect,  or midline shift. No hydrocephalus. The basilar cisterns are patent. No evidence of territorial infarct or acute ischemia. No extra-axial or intracranial fluid collection. Vascular: Atherosclerosis of skullbase vasculature without hyperdense vessel or abnormal calcification. Skull: No fracture or focal lesion. Sinuses/Orbits: Chronic paranasal sinus disease with postsurgical change. Scattered areas of mucosal thickening as well as heterogeneous debris in the right greater than left maxillary sinus. Minimal opacification of lower right mastoid air cells. Other: None. IMPRESSION: 1. No acute intracranial abnormality. 2. Generalized atrophy and chronic small vessel ischemia. 3. Chronic paranasal sinus disease. Bubbly debris within the right greater than left maxillary sinus may be acute on chronic sinus disease. Electronically Signed   By: Keith Rake M.D.   On: 06/13/2020 00:06   US Renal  Result Date: 06/13/2020 CLINICAL DATA:  Acute renal failure EXAM: RENAL / URINARY TRACT ULTRASOUND COMPLETE COMPARISON:  CT 12/03/2019, ultrasound 11/30/2016 FINDINGS: Right Kidney: Renal measurements: 8.6 x 5.6 x 5.0 cm = volume: 127.1 mL. Renal cortical echogenicity may be mildly increased though difficult to completely assess given what is likely a degree of up attic  steatosis as well. There is a 1.1 x 1.2 x 1.4 cm predominantly anechoic, cystic appearing lesion with increased posterior through transmission. A single thin internal septation is noted within this structure. No concerning internal vascularity or vascular mural nodularity. No other focal renal lesions. No hydronephrosis or shadowing calculus. Left Kidney: Renal measurements: 9.0 x 5.0 x 4.2 cm = volume: 90.6 mL. Slightly increased renal cortical echogenicity. Simple appearing anechoic 1.6 x 1.4 x 1.2 cm cyst in the upper pole with increased posterior through transmission. No concerning internal complexity. No other concerning renal lesions. No hydronephrosis or shadowing calculus. Bladder: Appears normal for degree of bladder distention. Other: Diffuse hepatic hypoattenuation compatible with hepatic steatosis. IMPRESSION: Technically challenging exam given the presence of extensive support devices and patient's or clinical status. Likely increased bilateral renal cortical echogenicity compatible with medical renal disease. Minimally complex cyst in the upper pole right kidney measuring 1.4 cm, not significantly changed from comparison Imaging in 2018. No dedicated follow-up imaging is typically warranted. Additional simple appearing cyst is seen in the upper pole left kidney as well. Incidental note made of hepatic steatosis. Electronically Signed   By: Lovena Le M.D.   On: 06/13/2020 06:46   DG Chest Portable 1 View  Result Date: 07/06/2020 CLINICAL DATA:  Lethargy, bradycardia EXAM: PORTABLE CHEST 1 VIEW COMPARISON:  Radiograph 10/17/2019 FINDINGS: Small stable left midlung granuloma. Mild bronchitic changes are similar to comparison images. No consolidation, features of edema, pneumothorax, or effusion. Pulmonary vascularity is normally distributed. The cardiomediastinal contours are unremarkable. No acute osseous or soft tissue abnormality. Telemetry leads overlie the chest. IMPRESSION: No acute  cardiopulmonary abnormality. Electronically Signed   By: Lovena Le M.D.   On: 06/19/2020 23:57   US Abdomen Limited RUQ (LIVER/GB)  Result Date: 06/13/2020 CLINICAL DATA:  Cirrhosis of liver. EXAM: ULTRASOUND ABDOMEN LIMITED RIGHT UPPER QUADRANT COMPARISON:  None. FINDINGS: Gallbladder: No gallstones or wall thickening visualized. No sonographic Murphy sign noted by sonographer. Common bile duct: Diameter: Not seen Liver: Liver is diffusely echogenic indicating fatty infiltration. No focal mass or lesion is seen within the liver. Portal vein is patent on color Doppler imaging with normal direction of blood flow towards the liver. Other: All 4 quadrants evaluated.  No free fluid is identified. IMPRESSION: 1. No acute findings. No gallstones. No evidence of acute cholecystitis. No ascites. 2. Fatty infiltration of the liver.  3. Common bile duct is not seen, presumably nondistended. Electronically Signed   By: Franki Cabot M.D.   On: 06/13/2020 10:33    Procedures Procedures (including critical care time)  Medications Ordered in ED Medications  thiamine (B-1) injection 100 mg (has no administration in time range)    ED Course  I have reviewed the triage vital signs and the nursing notes.  Pertinent labs & imaging results that were available during my care of the patient were reviewed by me and considered in my medical decision making (see chart for details).    MDM Rules/Calculators/A&P                         Patient is very ill-appearing.  It is unclear what the etiology.  There is multiple possibilities.  His temperature was very low and he really was bradycardic and lethargic he always consider myxedema coma.  Patient does not have any focal deficits to suggest stroke but more of a global or metabolic etiology.  Could be hyponatremia, hyperammonemia or possibly infectious. Work-up with significant acute renal failure.  Lactic acid also elevated.  Blood pressures trending down.  Multiple  use of fluid given and does not be fluid responsive.  We will plan for admission to hospitalist at this time.  Continued trend of fluids down.  Hospitalist prefers to wait for day team to come.  Continue to have low pressures will start peripheral Levophed.  Discussed with critical care for admission. Possibly hypovolemia from decreased intake, overdiiuresis, sepsis, cirrhosis.   Final Clinical Impression(s) / ED Diagnoses Final diagnoses:  Acute renal failure, unspecified acute renal failure type ([place])    Rx / DC Orders ED Discharge Orders    None

## 2020-06-13 NOTE — Progress Notes (Addendum)
[facility] Progress Note Patient Name: ZANE PELLECCHIA DOB: 21-Oct-1950 MRN: [id number]   Date of Service  06/13/2020  HPI/Events of Note  BMP shows K 3.4. Down from 4.4 (likely in part due to serum bicarbonate improving from 13 to 21). Cr down to 5.68 (from 7.20 on admission).   eICU Interventions  Ordered 20 mEq KCl IV in 200 mL.  Discontinue NS (running at 75 mL/hr).  Adjusted sodium bicarbonate drip (running at 75 mL/hr) to stop after 2 hours from now given improvement in metabolic acidosis.  Ordered LR at 150 mL/hr (to start upon completion of sodium bicarbonate infusion).   ADDENDUM 06/14/20 12:23 AM  - On review of chart, it appears he has had several CBGs in 60s requiring treatment. - Will change LR @ 150cc/hr to D5 LR @ 150cc/hr in above plan to help prevent recurrent hypoglycemia.  Intervention Category Intermediate Interventions: Electrolyte abnormality - evaluation and management  Marily Lente Elwanda Moger 06/13/2020, 11:04 PM

## 2020-06-13 NOTE — Progress Notes (Signed)
Pharmacy Antibiotic Note  Gabriel Ramos is a 70 y.o. male admitted on 07/06/20 with sepsis.  Pharmacy has been consulted for Vanc and Aztreonam dosing.  Patient with penicillin allergy of swelling.  No history in out system of other agents.   Patient with AKI - CrCl <10 ml/min  Plan: Aztreonam 500 mg IV q8hr Vanc 1250 mg x 1 in the ED, vanc variable dosing per pharmacy Monitor renal function, cultures and vanc levels as needed  Height: 5\' 10"  (177.8 cm) Weight: 61 kg (134 lb 7.7 oz) IBW/kg (Calculated) : 73  Temp (24hrs), Avg:95.6 F (35.3 C), Min:93.1 F (33.9 C), Max:98 F (36.7 C)  Recent Labs  Lab 06/13/20 0141 06/13/20 0425  WBC 8.0  --   CREATININE 7.20*  --   LATICACIDVEN 5.8* 4.1*    Estimated Creatinine Clearance: 8.6 mL/min (A) (by C-G formula based on SCr of 7.2 mg/dL (H)).    Allergies  Allergen Reactions  . Aspirin Shortness Of Breath and Swelling    REACTION: ASTHMA  . Penicillins Swelling    Makes lips and tongue swell up Has patient had a PCN reaction causing immediate rash, facial/tongue/throat swelling, SOB or lightheadedness with hypotension: yes Has patient had a PCN reaction causing severe rash involving mucus membranes or skin necrosis: no Has patient had a PCN reaction that required hospitalization: no Has patient had a PCN reaction occurring within the last 10 years: no, a little over 10 years If all of the above answers are "NO", then may proceed with Cephalosporin use.     Antimicrobials this admission: Vanc 1/8 >> Aztreonam 1/8 >>  Thank you for allowing pharmacy to be a part of this patient's care.  Alanda Slim, PharmD, [facility] Clinical Pharmacist Please see AMION for all Pharmacists' Contact Phone Numbers 06/13/2020, 9:16 AM

## 2020-06-13 NOTE — ED Notes (Signed)
Pt fiance would like a callback if possible, Patty [phone]

## 2020-06-13 NOTE — Progress Notes (Signed)
Code Sepsis initiated @ 0009 AM, ELINK following.

## 2020-06-13 NOTE — Consult Note (Signed)
Nephrology Consult   Requesting provider: Kara Mead Service requesting consult: CCM Reason for consult: AKI   Assessment/Recommendations: Gabriel Ramos is a/an 70 y.o. male with a past medical history hepatitis C, alcohol use disorder, liver disease who present w/ AKI  Non-Oliguric AKI: Likely secondary to dehydration and hypotension. BL normal. Urine Na low at 11 which likely indicated poor perfusion and makes ATN less likely at this time but this may develop in the coming days. He was urinating in the ER so hopefully will improve with time -Continue fluids; NS at 75cc/hr and Nabicarb at 75cc/hr -50 G of albumin as below -Consider pressors to maintain MAP >65 for optimal renal perfusion -Crt already improving down to 6.2 -UA and RUS reassuring  -Continue to monitor daily Cr, Dose meds for GFR -Monitor Daily I/Os, Daily weight  -Avoid nephrotoxic medications including NSAIDs and Vanc/Zosyn combo -Currently no indication for HD  Metabolic acidosis: Anion gap 22. Lactic acidosis and renal failure contributing -Continue sodium bicarb 150 M EQ 75 cc/h -Treat as needed  Hyponatremia: Likely associated with AKI and free water retention. Level 130. Continue to monitor  Hypoalbuminemia: Serum albumin 1.7. 50 g of albumin today in the setting of AKI and continue to monitor  Hepatitis C: Status post treatment based on notes. Wife states this was a recent diagnosis. Will recheck hepatitis C titers  Shock: Concerning for septic shock with possible cellulitis based on lower extremities. Antibiotics per primary team. Consider pressors   Recommendations conveyed to primary service.    [facility] 06/13/2020 1:43 PM   _____________________________________________________________________________________ CC: AKI  History of Present Illness: Gabriel Ramos is a/an 70 y.o. male with a past medical history of hepatitis C status post treatment, alcohol  use disorder, cirrhosis who presents with fatigue.  Patient presented to the hospital today with worsening fatigue.  Unclear history and patient provides minimal information.  He states that he has been very weak for several months and that he has not been able to get out of the bed for the past week.  He has not had alcohol for the past week.  He has had some nausea but denies shortness of breath (although dyspnea was reported to other physicians).  Possibly some fevers but difficult to say.  Also having some diarrhea but no abdominal pain.  It was possible that he had bradycardia in route to the hospital.  The wife states that the patient was recently diagnosed with hepatitis C.  However on chart review it appears that the patient has already finished treatment for hepatitis C and he has some liver disease, questionable cirrhosis related to alcohol use disorder.  In the emergency department he was found to be hypotensive with a creatinine of 7.2.  In the past his baseline has been 0.81.  He was given 3 L of fluid in the emergency department.  He also received antibiotics.  Lactate was noted to be elevated as well.  When I was in the emergency department he had about 300-400 cc of urine in the Foley bag   Medications:  Current Facility-Administered Medications  Medication Dose Route Frequency Provider Last Rate Last Admin  . 0.9 %  sodium chloride infusion  250 mL Intravenous Continuous Wyvonnia Dusky, MD 20 mL/hr at 06/13/20 1102 250 mL at 06/13/20 1102  . 0.9 %  sodium chloride infusion   Intravenous Continuous Reesa Chew, MD 75 mL/hr at 06/13/20 1102 New Bag at 06/13/20 1102  .  aztreonam (AZACTAM) 0.5 g in dextrose 5 % 50 mL IVPB  0.5 g Intravenous Q8H Kara Mead V, MD 100 mL/hr at 06/13/20 1105 0.5 g at 06/13/20 1105  . docusate sodium (COLACE) capsule 100 mg  100 mg Oral BID PRN Merlene Laughter F, NP      . fentaNYL (SUBLIMAZE) injection 25 mcg  25 mcg Intravenous Q3H PRN Rigoberto Noel, MD      . heparin injection 5,000 Units  5,000 Units Subcutaneous Q8H Davis, Whitney F, NP      . ipratropium-albuterol (DUONEB) 0.5-2.5 (3) MG/3ML nebulizer solution 3 mL  3 mL Nebulization Q4H PRN Rigoberto Noel, MD      . mometasone-formoterol (DULERA) 100-5 MCG/ACT inhaler 2 puff  2 puff Inhalation BID Rigoberto Noel, MD      . norepinephrine (LEVOPHED) 4mg  in 271mL premix infusion  2-10 mcg/min Intravenous Titrated Wyvonnia Dusky, MD   Held at 06/13/20 0932  . pantoprazole (PROTONIX) injection 40 mg  40 mg Intravenous QHS Merlene Laughter F, NP      . polyethylene glycol (MIRALAX / GLYCOLAX) packet 17 g  17 g Oral Daily PRN Merlene Laughter F, NP      . sodium bicarbonate 150 mEq in dextrose 5 % 1,000 mL infusion   Intravenous Continuous Rigoberto Noel, MD 75 mL/hr at 06/13/20 1101 New Bag at 06/13/20 1101  . vancomycin variable dose per unstable renal function (pharmacist dosing)   Does not apply See admin instructions Rigoberto Noel, MD       Current Outpatient Medications  Medication Sig Dispense Refill  . albuterol (PROAIR HFA) 108 (90 Base) MCG/ACT inhaler Inhale 2 puffs into the lungs every 6 (six) hours as needed for wheezing or shortness of breath. 18 g 5  . albuterol (PROVENTIL) (2.5 MG/3ML) 0.083% nebulizer solution Take 3 mLs (2.5 mg total) by nebulization every 6 (six) hours as needed for wheezing or shortness of breath. 75 mL 1  . Buprenorphine HCl-Naloxone HCl 8-2 MG FILM Place 1 Film under the tongue every other day.     . diphenhydrAMINE (BENADRYL) 25 MG tablet Take 25 mg by mouth every 6 (six) hours as needed for itching or allergies.    Marland Kitchen FLUoxetine (PROZAC) 20 MG capsule Take 20 mg by mouth daily.    . fluticasone (FLONASE) 50 MCG/ACT nasal spray Place 1 spray into both nostrils daily. 16 g 11  . fluticasone (FLOVENT HFA) 110 MCG/ACT inhaler Inhale 2 puffs into the lungs at bedtime. 1 each 11  . Nebulizers (NEBULIZER COMPRESSOR) MISC Use as directed Dx: 1 each 0   . ondansetron (ZOFRAN) 4 MG tablet Take 1 tablet (4 mg total) by mouth every 6 (six) hours. (Patient taking differently: Take 4 mg by mouth every 8 (eight) hours as needed for nausea.) 12 tablet 0  . pantoprazole (PROTONIX) 40 MG tablet TAKE 1 TABLET (40 MG TOTAL) BY MOUTH DAILY. TAKE 30-60 MIN BEFORE FIRST MEAL OF THE DAY (Patient taking differently: Take 40 mg by mouth at bedtime. Take 30-60 min before first meal of the day) 90 tablet 1  . potassium chloride 20 MEQ/15ML (10%) SOLN Take 15 mLs (20 mEq total) by mouth daily. 450 mL 11  . Spacer/Aero-Holding Chambers (AEROCHAMBER MV) inhaler Use as instructed 1 each 0  . spironolactone (ALDACTONE) 100 MG tablet Take 100 mg by mouth daily.     Marland Kitchen torsemide (DEMADEX) 20 MG tablet Take 1 tablet (20 mg total) by mouth daily. [place]  tablet 6     ALLERGIES Aspirin and Penicillins  MEDICAL HISTORY Past Medical History:  Diagnosis Date  . Abnormal CXR 09/16/2016   Report 09/07/16 c/w granuloma  . Alcohol dependence with unspecified alcohol-induced disorder ([place])   . Alcoholic hepatitis without ascites   . ALLERGIC RHINITIS 09/14/2006   Qualifier: Diagnosis of  By: [name]    . Anxiety   . Asthma, chronic, severe persistent, uncomplicated 1/60/7371   Allergy profile 09/15/2016 >  Eos 0.5/  IgE  271 Grass > dog  - 09/15/2016    try symbicort 160 2bid  - 10/17/2016  After extensive coaching HFA effectiveness =    75%  (ti too short)  - sinus CT 03/02/2017 >  Pos polyp    . BPH (benign prostatic hyperplasia)   . Concentric left ventricular hypertrophy   . DEPRESSION 09/14/2006   Qualifier: Diagnosis of  By: [name]    . Echocardiogram    Echocardiogram 9/21: EF 60-65, no RWMA, mild LVH, Gr 1 DD, normal RVSF, RVSP 23.1, trivial MR  . GERD (gastroesophageal reflux disease)   . Gout 03/28/2012  . Hepatitis C   . History of nuclear stress test    Myoview 9/21: EF 84, normal perfusion; low risk  . History of paroxysmal supraventricular tachycardia    . Hypertension   . Hypoalbuminemia   . Hypokalemia   . Nausea   . Opioid abuse ([place])   . Simple chronic bronchitis ([place])   . SVT (supraventricular tachycardia) ([place])    2013,echo [place] NI LV EF ,mild MR: nl stress cl 3/13  . SVT (supraventricular tachycardia) ([place]) 08/12/2018   Cardiac work-up in 2013 normal  . Vertigo      SOCIAL HISTORY Social History   Socioeconomic History  . Marital status: Significant Other    Spouse name: Not on file  . Number of children: 1  . Years of education: Not on file  . Highest education level: Not on file  Occupational History  . Occupation: retired  Tobacco Use  . Smoking status: Never Smoker  . Smokeless tobacco: Never Used  Vaping Use  . Vaping Use: Never used  Substance and Sexual Activity  . Alcohol use: Yes    Alcohol/week: 3.0 standard drinks    Types: 3 Shots of liquor per week    Comment: smirnoffs, BRANDY BOTTLE EVERY OTHER DAY  . Drug use: Yes    Frequency: 7.0 times per week    Types: Marijuana    Comment: this morning 08/27/19  . Sexual activity: Yes    Birth control/protection: None  Other Topics Concern  . Not on file  Social History Narrative  . Not on file   Social Determinants of Health   Financial Resource Strain: Not on file  Food Insecurity: Not on file  Transportation Needs: Not on file  Physical Activity: Not on file  Stress: Not on file  Social Connections: Not on file  Intimate Partner Violence: Not on file     FAMILY HISTORY Family History  Problem Relation Age of Onset  . Heart murmur Mother   . Heart disease Father   . Heart disease Brother   . Asthma Brother   . Liver cancer Brother        Stage 4   . Colon cancer Neg Hx   . Esophageal cancer Neg Hx   . Rectal cancer Neg Hx   . Stomach cancer Neg Hx       Review of Systems:  12 systems reviewed Otherwise as per HPI, all other systems reviewed and negative  Physical Exam: Vitals:   06/13/20 1230 06/13/20 1300  BP: (!) 80/56  105/74  Pulse: (!) 109 97  Resp: 16 17  Temp: 98 F (36.7 C) 98.1 F (36.7 C)  SpO2: 100% 99%   No intake/output data recorded. No intake or output data in the 24 hours ending 06/13/20 1343 General: ill appearing, lying in bed HEENT: anicteric sclera, oropharynx clear without lesions CV: tachycardia, no obvious murmur, 1+ edema in the bilateral lower extremities Lungs: clear to auscultation bilaterally, normal work of breathing Abd: soft, non-tender, non-distended Skin: Significant erythema of the bilateral lower extremities up to the knee, otherwise no visible lesions or rashes Psych: alert, engaged, appropriate mood and affect Musculoskeletal: no obvious deformities Neuro: normal speech, no gross focal deficits   Test Results Reviewed Lab Results  Component Value Date   NA 130 (L) 06/13/2020   K 4.4 06/13/2020   CL 96 (L) 06/13/2020   CO2 13 (L) 06/13/2020   BUN 67 (H) 06/13/2020   CREATININE 6.27 (H) 06/13/2020   GFR 83.16 12/16/2019   CALCIUM 7.8 (L) 06/13/2020   ALBUMIN 1.7 (L) 06/13/2020     I have reviewed all relevant outside healthcare records related to the patient's current hospitalization

## 2020-06-13 NOTE — Progress Notes (Signed)
Elink Code Sepsis Completion Note:  Pt had BC drawn before receiving ABX. Had the allotted IVF boluses per weight, but LA only came down to 4.1 from 5.8, still above 2.0. Additional IVF bolus and gtt ordered but not marked as administered on the MAR. Messaged ED RN to inquire if IVF was contraindicated.   Imya Mance elink RN

## 2020-06-14 DIAGNOSIS — K7460 Unspecified cirrhosis of liver: Secondary | ICD-10-CM | POA: Diagnosis not present

## 2020-06-14 DIAGNOSIS — R6521 Severe sepsis with septic shock: Secondary | ICD-10-CM | POA: Diagnosis not present

## 2020-06-14 DIAGNOSIS — L8990 Pressure ulcer of unspecified site, unspecified stage: Secondary | ICD-10-CM | POA: Diagnosis present

## 2020-06-14 DIAGNOSIS — B182 Chronic viral hepatitis C: Secondary | ICD-10-CM | POA: Diagnosis not present

## 2020-06-14 DIAGNOSIS — I959 Hypotension, unspecified: Secondary | ICD-10-CM | POA: Diagnosis not present

## 2020-06-14 DIAGNOSIS — A419 Sepsis, unspecified organism: Secondary | ICD-10-CM | POA: Diagnosis not present

## 2020-06-14 DIAGNOSIS — E872 Acidosis: Secondary | ICD-10-CM | POA: Diagnosis not present

## 2020-06-14 DIAGNOSIS — N179 Acute kidney failure, unspecified: Secondary | ICD-10-CM | POA: Diagnosis not present

## 2020-06-14 DIAGNOSIS — E871 Hypo-osmolality and hyponatremia: Secondary | ICD-10-CM | POA: Diagnosis not present

## 2020-06-14 LAB — BASIC METABOLIC PANEL
Anion gap: 15 (ref 5–15)
BUN: 61 mg/dL — ABNORMAL HIGH (ref 8–23)
CO2: 20 mmol/L — ABNORMAL LOW (ref 22–32)
Calcium: 6.9 mg/dL — ABNORMAL LOW (ref 8.9–10.3)
Chloride: 99 mmol/L (ref 98–111)
Creatinine, Ser: 5.3 mg/dL — ABNORMAL HIGH (ref 0.61–1.24)
GFR, Estimated: 11 mL/min — ABNORMAL LOW (ref >60)
Glucose, Bld: 124 mg/dL — ABNORMAL HIGH (ref 70–99)
Potassium: 3.3 mmol/L — ABNORMAL LOW (ref 3.5–5.1)
Sodium: 134 mmol/L — ABNORMAL LOW (ref 135–145)

## 2020-06-14 LAB — GLUCOSE, CAPILLARY
Glucose-Capillary: 112 mg/dL — ABNORMAL HIGH (ref 70–99)
Glucose-Capillary: 140 mg/dL — ABNORMAL HIGH (ref 70–99)
Glucose-Capillary: 157 mg/dL — ABNORMAL HIGH (ref 70–99)
Glucose-Capillary: 164 mg/dL — ABNORMAL HIGH (ref 70–99)
Glucose-Capillary: 166 mg/dL — ABNORMAL HIGH (ref 70–99)
Glucose-Capillary: 74 mg/dL (ref 70–99)
Glucose-Capillary: 76 mg/dL (ref 70–99)

## 2020-06-14 LAB — CBC
HCT: 22.6 % — ABNORMAL LOW (ref 39.0–52.0)
Hemoglobin: 8.3 g/dL — ABNORMAL LOW (ref 13.0–17.0)
MCH: 35.8 pg — ABNORMAL HIGH (ref 26.0–34.0)
MCHC: 36.7 g/dL — ABNORMAL HIGH (ref 30.0–36.0)
MCV: 97.4 fL (ref 80.0–100.0)
Platelets: 136 10*3/uL — ABNORMAL LOW (ref 150–400)
RBC: 2.32 MIL/uL — ABNORMAL LOW (ref 4.22–5.81)
RDW: 15.9 % — ABNORMAL HIGH (ref 11.5–15.5)
WBC: 7.8 10*3/uL (ref 4.0–10.5)
nRBC: 0 % (ref 0.0–0.2)

## 2020-06-14 LAB — URINE CULTURE: Culture: NO GROWTH

## 2020-06-14 LAB — MAGNESIUM: Magnesium: 1.6 mg/dL — ABNORMAL LOW (ref 1.7–2.4)

## 2020-06-14 LAB — VANCOMYCIN, RANDOM: Vancomycin Rm: 13

## 2020-06-14 LAB — ALBUMIN: Albumin: 1.5 g/dL — ABNORMAL LOW (ref 3.5–5.0)

## 2020-06-14 LAB — PHOSPHORUS: Phosphorus: 4.7 mg/dL — ABNORMAL HIGH (ref 2.5–4.6)

## 2020-06-14 MED ORDER — MAGNESIUM SULFATE 2 GM/50ML IV SOLN
2.0000 g | Freq: Once | INTRAVENOUS | Status: AC
  Administered 2020-06-14: 2 g via INTRAVENOUS
  Filled 2020-06-14: qty 50

## 2020-06-14 MED ORDER — DEXTROSE IN LACTATED RINGERS 5 % IV SOLN: INTRAVENOUS | Status: DC

## 2020-06-14 MED ORDER — VANCOMYCIN HCL IN DEXTROSE 1-5 GM/200ML-% IV SOLN
1000.0000 mg | Freq: Once | INTRAVENOUS | Status: AC
  Administered 2020-06-14: 1000 mg via INTRAVENOUS
  Filled 2020-06-14: qty 200

## 2020-06-14 MED ORDER — ALBUMIN HUMAN 25 % IV SOLN
25.0000 g | Freq: Four times a day (QID) | INTRAVENOUS | Status: AC
  Administered 2020-06-14 (×2): 25 g via INTRAVENOUS
  Filled 2020-06-14 (×2): qty 100

## 2020-06-14 MED ORDER — MIDODRINE HCL 5 MG PO TABS
5.0000 mg | ORAL_TABLET | Freq: Three times a day (TID) | ORAL | Status: DC
  Administered 2020-06-14 – 2020-06-15 (×6): 5 mg via ORAL
  Filled 2020-06-14 (×5): qty 1

## 2020-06-14 MED ORDER — ALBUTEROL SULFATE HFA 108 (90 BASE) MCG/ACT IN AERS
1.0000 | INHALATION_SPRAY | Freq: Four times a day (QID) | RESPIRATORY_TRACT | Status: DC | PRN
  Administered 2020-06-17 – 2020-06-18 (×2): 2 via RESPIRATORY_TRACT
  Filled 2020-06-14 (×2): qty 6.7

## 2020-06-14 MED ORDER — WHITE PETROLATUM EX OINT
TOPICAL_OINTMENT | CUTANEOUS | Status: AC
  Administered 2020-06-14: 0.2
  Filled 2020-06-14: qty 28.35

## 2020-06-14 MED ORDER — THIAMINE HCL 100 MG PO TABS
50.0000 mg | ORAL_TABLET | Freq: Every day | ORAL | Status: DC
  Administered 2020-06-14 – 2020-06-16 (×3): 50 mg via ORAL
  Filled 2020-06-14 (×4): qty 1

## 2020-06-14 MED ORDER — FOLIC ACID 1 MG PO TABS
1.0000 mg | ORAL_TABLET | Freq: Every day | ORAL | Status: DC
  Administered 2020-06-14 – 2020-06-16 (×3): 1 mg via ORAL
  Filled 2020-06-14 (×4): qty 1

## 2020-06-14 MED ORDER — CHLORHEXIDINE GLUCONATE CLOTH 2 % EX PADS
6.0000 | MEDICATED_PAD | Freq: Every day | CUTANEOUS | Status: DC
  Administered 2020-06-14 – 2020-06-19 (×5): 6 via TOPICAL

## 2020-06-14 NOTE — Progress Notes (Signed)
NAME:  Gabriel Ramos, MRN:  [id number], DOB:  [birth year], LOS: 1 ADMISSION DATE:  2020-06-23, CONSULTATION DATE:  06/14/2020  REFERRING MD:  ED, mesner, CHIEF COMPLAINT: Hypotension, renal failure  Brief History:  70 year old EtOH, opiate use disorder, 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 admitted with bilateral lower extremity cellulitis, hypertension, nausea and vomiting for 2 weeks and AKI  History of Present Illness:  He has hep C/EtOH related cirrhosis, chronic active hepatitis and chronic pedal edema likely on this basis.  I have reviewed prior notes from infectious disease, GI, cardiology.  Cardiology work-up has shown normal LVEF and normal right heart pressures.  He is maintained on torsemide and Aldactone.  Last creatinine 03/2020 was normal. For the last 2 weeks he has developed nausea and vomiting and was unable to take much orally.  He reports drinking Smirnoff lites but for the past month has changed to a shot of brandy every morning to prevent shakes.  For the past few days he was unable to take this.  Developed dyspnea which brought him to the emergency room found to have AKI and hypotension, blood pressure improved with 3 L of fluids and has not required pressors so far. Received empiric antibiotics for lactate of 5.8 with aztreonam/vancomycin/Flagyl. PCCM consulted for further management  Past Medical History:  Hep C/EtOH cirrhosis -no varices on last EGD, no overt hepatic encephalopathy Chronic hypoalbuminemia with pedal edema Normal LV function, normal right heart pressures EtOH use disorder Opiate use disorder, on Suboxone Asthma  Significant Hospital Events:  1/8 > Admission  Consults:  Nephrology  Procedures:  n/a  Significant Diagnostic Tests:  CT head 1/7 >> No acute intracranial abnormality. Generalized atrophy and chronic small vessel ischemia. Chronic paranasal sinus disease. Bubbly debris within the right greater than left maxillary sinus may be acute  on chronic sinus disease.  [place] abd 1/8 >> No acute findings. No gallstones. No evidence of acute cholecystitis. No ascites. Fatty infiltration of the liver.   Renal US 1/8 >> Technically challenging exam given the presence of extensive support devices and patient's or clinical status. Likely increased bilateral renal cortical echogenicity compatible with medical renal disease. Minimally complex cyst in the upper pole right kidney measuring 1.4cm, not significantly changed from comparison Imaging in 2018. No dedicated follow-up imaging is typically warranted. Additional simple appearing cyst is seen in the upper pole left kidney as well  Micro Data:  BCx 1/8 >> NGTD UCx 1/8 >> NGTD   Antimicrobials:  Aztreonam 1/8 >> Vancomycin 1/8>> Flagyl 1/8  Interim History / Subjective:  No acute events overnight. Patient reports he's doing "okay" this AM.  Objective   Blood pressure 106/60, pulse 92, temperature 98 F (36.7 C), temperature source Axillary, resp. rate 18, height 5\' 10"  (1.778 m), weight 55.9 kg, SpO2 100 %.        Intake/Output Summary (Last 24 hours) at 06/14/2020 1030 Last data filed at 06/14/2020 0600 Gross per 24 hour  Intake 1232.5 ml  Output 530 ml  Net 702.5 ml   Filed Weights   Jun 23, 2020 2320 06/14/20 0500  Weight: 61 kg 55.9 kg    Examination: General: Chronically ill-appearing, no acute distress HENT: Mild pallor, no icterus, dry oral mucosa Lungs: Faint rhonchi scattered, no increased work of breathing Cardiovascular: Regular rate, rhythm. No m/r/g Abdomen: Soft, non-tender. No ascites appreciated Extremities: Erythema up to mid shin, 1+ edema, mild tenderness, gangrenous 1 inch area over the left third toe Neuro: Awake oriented x3, mild tremors,  nonfocal  GU: No Foley  Resolved Hospital Problem list     Assessment & Plan:  Gabriel Ramos is 70yo male w/ cirrhosis, alcohol and opiate use disorder admitted 1/8 w/ septic shock from bilateral lower extremity  cellulitis w/ AKI.  #Septic shock 2/2 BLE cellulitis Patient afebrile, no leukocytosis. MAP's 70's on minimal peripheral vasopressor support. Starting midodrine today in hopes to wean off pressors. Lactic down-trend from 5.8>2.8. Continues to be on fluids for now, hopeful he will be able to tolerate po later today. No concern for SBP given no ascites on u/s or complaints of abdominal pain. Continuing with empiric antibiotics. - C/w vancomycin, aztreonam - IVF: D5 w/ LR, can stop if tolerating po - MAP goal >65, wean Levo as tolerated  #Acute kidney injury #Anion gap metabolic acidosis Nephrology following, appreciate recs. sCr down-trending, 5.68 > 5.3. Acidosis improving as well, this AM w/ bicarb 20 and gap 13. Likely 2/2 lactic acidosis. No need for CRRT at this time. Renal u/s yesterday revealed chronic medical disease. Most likely pre-renal AKI 2/2 septic shock. Will continue with fluids w/ D5 until he can tolerate po. - C/w IVF  - Daily BMP - Strict I&O's - Avoid nephrotoxic medications  #EtOH/HepC cirrhosis #Chronic pedal edema On exam, no ascites present and patient afebrile without signs of abdominal infection. Edema likely related to hypoalbuminemia 2/2 liver disease. Received 25g albumin yesterday, will re-check level this AM. Ultrasound w/o ascites or gallbladder disease. Holding diuretics for now, symptomatically treat nausea as needed. Unclear when last drink, will monitor for signs of withdrawal. - F/u albumin level - Start thiamine, folate qd - Zofran PRN - Hold diuretics   #Asthma Patient requested albuterol inhaler this AM. On 4L [place], no supplemental O2 at home. CXR on arrival neg. - Albuterol inhaler q6 PRN  #Pain/chronic opiate use disorder on Suboxone -Fentanyl as needed.  Best practice (evaluated daily)  Diet: Clear liquids Pain/Anxiety/Delirium protocol (if indicated): Fentanyl as needed VAP protocol (if indicated): N/A DVT prophylaxis: SubQ heparin GI  prophylaxis: N/A Glucose control: N/A Mobility: Bedrest Disposition: ICU  Goals of Care:  Last date of multidisciplinary goals of care discussion:NA Family and staff present: NA Summary of discussion: NA Follow up goals of care discussion due: NA Code Status: Full  Labs   CBC: Recent Labs  Lab 06/13/20 0141 06/13/20 0154 06/13/20 1128 06/14/20 0128  WBC 8.0  --  6.9 7.8  NEUTROABS 7.4  --   --   --   HGB 10.3* 10.5* 8.6* 8.3*  HCT 30.9* 31.0* 23.8* 22.6*  MCV 101.0*  --  96.7 97.4  PLT 237  --  174 136*    Basic Metabolic Panel: Recent Labs  Lab 06/13/20 0141 06/13/20 0154 06/13/20 1128 06/13/20 2022 06/14/20 0128  NA 131* 130*  --  134* 134*  K 4.6 4.4  --  3.4* 3.3*  CL 96*  --   --  98 99  CO2 13*  --   --  21* 20*  GLUCOSE 125*  --   --  69* 124*  BUN 67*  --   --  63* 61*  CREATININE 7.20*  --  6.27* 5.68* 5.30*  CALCIUM 7.8*  --   --  7.0* 6.9*  MG 1.6*  --   --   --  1.6*  PHOS  --   --   --   --  4.7*   GFR: Estimated Creatinine Clearance: 10.7 mL/min (A) (by C-G formula based on SCr  of 5.3 mg/dL (H)). Recent Labs  Lab 06/13/20 0141 06/13/20 0425 06/13/20 0840 06/13/20 1128 06/14/20 0128  WBC 8.0  --   --  6.9 7.8  LATICACIDVEN 5.8* 4.1* 2.8*  --   --     Liver Function Tests: Recent Labs  Lab 06/13/20 0141  AST 35  ALT 20  ALKPHOS 127*  BILITOT 2.0*  PROT 4.6*  ALBUMIN 1.7*   Recent Labs  Lab 06/13/20 1128  LIPASE 16   Recent Labs  Lab 06/13/20 0141  AMMONIA 48*    ABG    Component Value Date/Time   HCO3 12.1 (L) 06/13/2020 0154   TCO2 13 (L) 06/13/2020 0154   ACIDBASEDEF 12.0 (H) 06/13/2020 0154   O2SAT 83.0 06/13/2020 0154     Coagulation Profile: No results for input(s): INR, PROTIME in the last 168 hours.  Cardiac Enzymes: Recent Labs  Lab 06/13/20 1128  CKTOTAL 208    HbA1C: No results found for: HGBA1C  CBG: Recent Labs  Lab 06/13/20 2030 06/13/20 2049 06/14/20 0016 06/14/20 0417  06/14/20 0835  GLUCAP 62* 186* 112* 157* 140*    Review of Systems:   Pain in lower extremities Nausea, vomiting, unable to take p.o. Generalized weakness No fevers, chills, chest pain, palpitations, shortness of breath  Past Medical History:  He,  has a past medical history of Abnormal CXR (09/16/2016), Alcohol dependence with unspecified alcohol-induced disorder ([place]), Alcoholic hepatitis without ascites, ALLERGIC RHINITIS (09/14/2006), Anxiety, Asthma, chronic, severe persistent, uncomplicated (09/22/3788), BPH (benign prostatic hyperplasia), Concentric left ventricular hypertrophy, DEPRESSION (09/14/2006), Echocardiogram, GERD (gastroesophageal reflux disease), Gout (03/28/2012), Hepatitis C, History of nuclear stress test, History of paroxysmal supraventricular tachycardia, Hypertension, Hypoalbuminemia, Hypokalemia, Nausea, Opioid abuse ([place]), Simple chronic bronchitis ([place]), SVT (supraventricular tachycardia) ([place]), SVT (supraventricular tachycardia) ([place]) (08/12/2018), and Vertigo.   Surgical History:   Past Surgical History:  Procedure Laterality Date  . polyps remvoed from nose    . RIGHT HEART CATH N/A 03/13/2020   Procedure: RIGHT HEART CATH;  Surgeon: Nelva Bush, MD;  Location: [facility];  Service: Cardiovascular;  Laterality: N/A;  . UPPER GI ENDOSCOPY     approx 10 years     Social History:   reports that he has never smoked. He has never used smokeless tobacco. He reports current alcohol use of about 3.0 standard drinks of alcohol per week. He reports current drug use. Frequency: 7.00 times per week. Drug: Marijuana.   Family History:  His family history includes Asthma in his brother; Heart disease in his brother and father; Heart murmur in his mother; Liver cancer in his brother. There is no history of Colon cancer, Esophageal cancer, Rectal cancer, or Stomach cancer.   Allergies Allergies  Allergen Reactions  . Aspirin Shortness Of Breath and Swelling     REACTION: ASTHMA  . Penicillins Swelling    Makes lips and tongue swell up Has patient had a PCN reaction causing immediate rash, facial/tongue/throat swelling, SOB or lightheadedness with hypotension: yes Has patient had a PCN reaction causing severe rash involving mucus membranes or skin necrosis: no Has patient had a PCN reaction that required hospitalization: no Has patient had a PCN reaction occurring within the last 10 years: no, a little over 10 years If all of the above answers are "NO", then may proceed with Cephalosporin use.      Home Medications  Prior to Admission medications   Medication Sig Start Date End Date Taking? Authorizing Provider  albuterol [facility] HFA) 108 (90 Base)  MCG/ACT inhaler Inhale 2 puffs into the lungs every 6 (six) hours as needed for wheezing or shortness of breath. 02/25/20  Yes Spero Geralds, MD  albuterol (PROVENTIL) (2.5 MG/3ML) 0.083% nebulizer solution Take 3 mLs (2.5 mg total) by nebulization every 6 (six) hours as needed for wheezing or shortness of breath. 09/15/16  Yes Tanda Rockers, MD  Buprenorphine HCl-Naloxone HCl 8-2 MG FILM Place 1 Film under the tongue every other day.  08/06/18  Yes [provider]  diphenhydrAMINE (BENADRYL) 25 MG tablet Take 25 mg by mouth every 6 (six) hours as needed for itching or allergies.   Yes [provider]  FLUoxetine (PROZAC) 20 MG capsule Take 20 mg by mouth daily.   Yes [provider]  fluticasone (FLONASE) 50 MCG/ACT nasal spray Place 1 spray into both nostrils daily. 02/25/20  Yes Spero Geralds, MD  fluticasone (FLOVENT HFA) 110 MCG/ACT inhaler Inhale 2 puffs into the lungs at bedtime. 02/25/20  Yes Spero Geralds, MD  Nebulizers (NEBULIZER COMPRESSOR) MISC Use as directed Dx: 01/10/17  Yes Tanda Rockers, MD  ondansetron (ZOFRAN) 4 MG tablet Take 1 tablet (4 mg total) by mouth every 6 (six) hours. Patient taking differently: Take 4 mg by mouth every 8 (eight) hours as needed  for nausea. 09/15/16  Yes Tanda Rockers, MD  pantoprazole (PROTONIX) 40 MG tablet TAKE 1 TABLET (40 MG TOTAL) BY MOUTH DAILY. TAKE 30-60 MIN BEFORE FIRST MEAL OF THE DAY Patient taking differently: Take 40 mg by mouth at bedtime. Take 30-60 min before first meal of the day 07/07/17  Yes Tanda Rockers, MD  potassium chloride 20 MEQ/15ML (10%) SOLN Take 15 mLs (20 mEq total) by mouth daily. 02/19/20  Yes Richardson Dopp T, PA-C  Spacer/Aero-Holding Chambers (AEROCHAMBER MV) inhaler Use as instructed 10/28/19  Yes Spero Geralds, MD  spironolactone (ALDACTONE) 100 MG tablet Take 100 mg by mouth daily.  10/17/19  Yes [provider]  torsemide (DEMADEX) 20 MG tablet Take 1 tablet (20 mg total) by mouth daily. 01/17/20  Yes Liliane Shi, PA-C     Critical care time: 30 minutes     Sanjuan Dame, MD Internal Medicine PGY-1 Pager: [phone]

## 2020-06-14 NOTE — Progress Notes (Signed)
Pharmacy Antibiotic Note  Gabriel Ramos is a 70 y.o. male admitted on 06/20/2020 with sepsis.  Pharmacy has been consulted for Vanc and Aztreonam dosing.  Patient with penicillin allergy of swelling.  No history in our system of other agents.   Patient with AKI - Scr 5.3 today, random vanc level came back at 13 mcg/ml. Will redose and check level again tomorrow am  Plan: Vancomycin 1000 mg IV x 1 Vanc random tomorrow at noon  Height: 5\' 10"  (177.8 cm) Weight: 55.9 kg (123 lb 3.8 oz) IBW/kg (Calculated) : 73  Temp (24hrs), Avg:98.4 F (36.9 C), Min:98 F (36.7 C), Max:98.6 F (37 C)  Recent Labs  Lab 06/13/20 0141 06/13/20 0425 06/13/20 0840 06/13/20 1128 06/13/20 2022 06/14/20 0128 06/14/20 1131  WBC 8.0  --   --  6.9  --  7.8  --   CREATININE 7.20*  --   --  6.27* 5.68* 5.30*  --   LATICACIDVEN 5.8* 4.1* 2.8*  --   --   --   --   VANCORANDOM  --   --   --   --   --   --  13    Estimated Creatinine Clearance: 10.7 mL/min (A) (by C-G formula based on SCr of 5.3 mg/dL (H)).    Allergies  Allergen Reactions  . Aspirin Shortness Of Breath and Swelling    REACTION: ASTHMA  . Penicillins Swelling    Makes lips and tongue swell up Has patient had a PCN reaction causing immediate rash, facial/tongue/throat swelling, SOB or lightheadedness with hypotension: yes Has patient had a PCN reaction causing severe rash involving mucus membranes or skin necrosis: no Has patient had a PCN reaction that required hospitalization: no Has patient had a PCN reaction occurring within the last 10 years: no, a little over 10 years If all of the above answers are "NO", then may proceed with Cephalosporin use.     Antimicrobials this admission: Vanc 1/8 >> Aztreonam 1/8 >>  Thank you for allowing pharmacy to be a part of this patient's care.  Alanda Slim, PharmD, [facility] Clinical Pharmacist Please see AMION for all Pharmacists' Contact Phone Numbers 06/14/2020, 1:03 PM

## 2020-06-14 NOTE — H&P (Deleted)
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  I have reviewed prior notes from infectious disease, GI, cardiology.  Cardiology work-up has shown normal LVEF and normal right heart pressures.  He is maintained on torsemide and Aldactone.  Last creatinine 03/2020 was normal. For the last 2 weeks he has developed nausea and vomiting and was unable to take much orally.  He reports drinking Smirnoff lites but for the past month has changed to a shot of brandy every morning to prevent shakes.  For the past few days he was unable to take this.  Developed dyspnea which brought him to the emergency room found to have AKI and hypotension, blood pressure improved with 3 L of fluids and has not required pressors so far. Received empiric antibiotics for lactate of 5.8 with aztreonam/vancomycin/Flagyl. PCCM consulted for further management  Past Medical History:  Hep C/EtOH cirrhosis -no varices on last EGD, no overt hepatic encephalopathy Chronic hypoalbuminemia with pedal edema Normal LV function, normal right heart pressures EtOH use disorder Opiate use disorder, on Suboxone Asthma  Significant Hospital Events:  1/8 > Admission  Consults:  Nephrology  Procedures:  n/a  Significant Diagnostic Tests:  CT head 1/7 >> No acute intracranial abnormality. Generalized atrophy and chronic small vessel ischemia. Chronic paranasal sinus disease. Bubbly debris within the right greater than left maxillary sinus may be acute  on chronic sinus disease.  [place] abd 1/8 >> No acute findings. No gallstones. No evidence of acute cholecystitis. No ascites. Fatty infiltration of the liver.   Renal US 1/8 >> Technically challenging exam given the presence of extensive support devices and patient's or clinical status. Likely increased bilateral renal cortical echogenicity compatible with medical renal disease. Minimally complex cyst in the upper pole right kidney measuring 1.4cm, not significantly changed from comparison Imaging in 2018. No dedicated follow-up imaging is typically warranted. Additional simple appearing cyst is seen in the upper pole left kidney as well  Micro Data:  BCx 1/8 >> NGTD UCx 1/8 >> NGTD   Antimicrobials:  Aztreonam 1/8 >> Vancomycin 1/8>> Flagyl 1/8  Interim History / Subjective:  No acute events overnight. Patient reports he's doing "okay" this AM.  Objective   Blood pressure 106/60, pulse 92, temperature 98 F (36.7 C), temperature source Axillary, resp. rate 18, height 5\' 10"  (1.778 m), weight 55.9 kg, SpO2 100 %.        Intake/Output Summary (Last 24 hours) at 06/14/2020 0934 Last data filed at 06/14/2020 0600 Gross per 24 hour  Intake 1232.5 ml  Output 530 ml  Net 702.5 ml   Filed Weights   06/29/2020 2320 06/14/20 0500  Weight: 61 kg 55.9 kg    Examination: General: Chronically ill-appearing, no acute distress HENT: Mild pallor, no icterus, dry oral mucosa Lungs: Faint rhonchi scattered, no increased work of breathing Cardiovascular: Regular rate, rhythm. No m/r/g Abdomen: Soft, non-tender. No ascites appreciated Extremities: Erythema up to mid shin, 1+ edema, mild tenderness, gangrenous 1 inch area over the left third toe Neuro: Awake oriented x3, mild tremors,  nonfocal  GU: No Foley  Resolved Hospital Problem list     Assessment & Plan:  [name] is 70yo male w/ cirrhosis, alcohol and opiate use disorder admitted 1/8 w/ septic shock from bilateral lower extremity  cellulitis w/ AKI.  #Septic shock 2/2 BLE cellulitis Patient afebrile, no leukocytosis. MAP's 70's on minimal peripheral vasopressor support. Starting midodrine today in hopes to wean off pressors. Lactic down-trend from 5.8>2.8. Continues to be on fluids for now, hopeful he will be able to tolerate po later today. No concern for SBP given no ascites on u/s or complaints of abdominal pain. Continuing with empiric antibiotics. - C/w vancomycin, aztreonam - IVF: D5 w/ LR, can stop if tolerating po - MAP goal >65, wean Levo as tolerated  #Acute kidney injury #Anion gap metabolic acidosis Nephrology following, appreciate recs. sCr down-trending, 5.68 > 5.3. Acidosis improving as well, this AM w/ bicarb 20 and gap 13. Likely 2/2 lactic acidosis. No need for CRRT at this time. Renal u/s yesterday revealed chronic medical disease. Most likely pre-renal AKI 2/2 septic shock. Will continue with fluids w/ D5 until he can tolerate po. - C/w IVF  - Daily BMP - Strict I&O's - Avoid nephrotoxic medications  #EtOH/HepC cirrhosis #Chronic pedal edema On exam, no ascites present and patient afebrile without signs of abdominal infection. Edema likely related to hypoalbuminemia 2/2 liver disease. Received 25g albumin yesterday, will re-check level this AM. Ultrasound w/o ascites or gallbladder disease. Holding diuretics for now, symptomatically treat nausea as needed. Unclear when last drink, will monitor for signs of withdrawal. - F/u albumin level - Start thiamine, folate qd - Zofran PRN - Hold diuretics   #Asthma Patient requested albuterol inhaler this AM. On 4L [place], no supplemental O2 at home. CXR on arrival neg. - Albuterol inhaler q6 PRN  #Pain/chronic opiate use disorder on Suboxone -Fentanyl as needed.  Best practice (evaluated daily)  Diet: Clear liquids Pain/Anxiety/Delirium protocol (if indicated): Fentanyl as needed VAP protocol (if indicated): N/A DVT prophylaxis: SubQ heparin GI  prophylaxis: N/A Glucose control: N/A Mobility: Bedrest Disposition: ICU  Goals of Care:  Last date of multidisciplinary goals of care discussion:NA Family and staff present: NA Summary of discussion: NA Follow up goals of care discussion due: NA Code Status: Full  Labs   CBC: Recent Labs  Lab 06/13/20 0141 06/13/20 0154 06/13/20 1128 06/14/20 0128  WBC 8.0  --  6.9 7.8  NEUTROABS 7.4  --   --   --   HGB 10.3* 10.5* 8.6* 8.3*  HCT 30.9* 31.0* 23.8* 22.6*  MCV 101.0*  --  96.7 97.4  PLT 237  --  174 136*    Basic Metabolic Panel: Recent Labs  Lab 06/13/20 0141 06/13/20 0154 06/13/20 1128 06/13/20 2022 06/14/20 0128  NA 131* 130*  --  134* 134*  K 4.6 4.4  --  3.4* 3.3*  CL 96*  --   --  98 99  CO2 13*  --   --  21* 20*  GLUCOSE 125*  --   --  69* 124*  BUN 67*  --   --  63* 61*  CREATININE 7.20*  --  6.27* 5.68* 5.30*  CALCIUM 7.8*  --   --  7.0* 6.9*  MG 1.6*  --   --   --  1.6*  PHOS  --   --   --   --  4.7*   GFR: Estimated Creatinine Clearance: 10.7 mL/min (A) (by C-G formula based on SCr  of 5.3 mg/dL (H)). Recent Labs  Lab 06/13/20 0141 06/13/20 0425 06/13/20 0840 06/13/20 1128 06/14/20 0128  WBC 8.0  --   --  6.9 7.8  LATICACIDVEN 5.8* 4.1* 2.8*  --   --     Liver Function Tests: Recent Labs  Lab 06/13/20 0141  AST 35  ALT 20  ALKPHOS 127*  BILITOT 2.0*  PROT 4.6*  ALBUMIN 1.7*   Recent Labs  Lab 06/13/20 1128  LIPASE 16   Recent Labs  Lab 06/13/20 0141  AMMONIA 48*    ABG    Component Value Date/Time   HCO3 12.1 (L) 06/13/2020 0154   TCO2 13 (L) 06/13/2020 0154   ACIDBASEDEF 12.0 (H) 06/13/2020 0154   O2SAT 83.0 06/13/2020 0154     Coagulation Profile: No results for input(s): INR, PROTIME in the last 168 hours.  Cardiac Enzymes: Recent Labs  Lab 06/13/20 1128  CKTOTAL 208    HbA1C: No results found for: HGBA1C  CBG: Recent Labs  Lab 06/13/20 2030 06/13/20 2049 06/14/20 0016 06/14/20 0417  06/14/20 0835  GLUCAP 62* 186* 112* 157* 140*    Review of Systems:   Pain in lower extremities Nausea, vomiting, unable to take p.o. Generalized weakness No fevers, chills, chest pain, palpitations, shortness of breath  Past Medical History:  He,  has a past medical history of Abnormal CXR (09/16/2016), Alcohol dependence with unspecified alcohol-induced disorder ([place]), Alcoholic hepatitis without ascites, ALLERGIC RHINITIS (09/14/2006), Anxiety, Asthma, chronic, severe persistent, uncomplicated (09/22/3788), BPH (benign prostatic hyperplasia), Concentric left ventricular hypertrophy, DEPRESSION (09/14/2006), Echocardiogram, GERD (gastroesophageal reflux disease), Gout (03/28/2012), Hepatitis C, History of nuclear stress test, History of paroxysmal supraventricular tachycardia, Hypertension, Hypoalbuminemia, Hypokalemia, Nausea, Opioid abuse ([place]), Simple chronic bronchitis ([place]), SVT (supraventricular tachycardia) ([place]), SVT (supraventricular tachycardia) ([place]) (08/12/2018), and Vertigo.   Surgical History:   Past Surgical History:  Procedure Laterality Date  . polyps remvoed from nose    . RIGHT HEART CATH N/A 03/13/2020   Procedure: RIGHT HEART CATH;  Surgeon: Nelva Bush, MD;  Location: [place] [facility];  Service: Cardiovascular;  Laterality: N/A;  . UPPER GI ENDOSCOPY     approx 10 years     Social History:   reports that he has never smoked. He has never used smokeless tobacco. He reports current alcohol use of about 3.0 standard drinks of alcohol per week. He reports current drug use. Frequency: 7.00 times per week. Drug: Marijuana.   Family History:  His family history includes Asthma in his brother; Heart disease in his brother and father; Heart murmur in his mother; Liver cancer in his brother. There is no history of Colon cancer, Esophageal cancer, Rectal cancer, or Stomach cancer.   Allergies Allergies  Allergen Reactions  . Aspirin Shortness Of Breath and Swelling     REACTION: ASTHMA  . Penicillins Swelling    Makes lips and tongue swell up Has patient had a PCN reaction causing immediate rash, facial/tongue/throat swelling, SOB or lightheadedness with hypotension: yes Has patient had a PCN reaction causing severe rash involving mucus membranes or skin necrosis: no Has patient had a PCN reaction that required hospitalization: no Has patient had a PCN reaction occurring within the last 10 years: no, a little over 10 years If all of the above answers are "NO", then may proceed with Cephalosporin use.      Home Medications  Prior to Admission medications   Medication Sig Start Date End Date Taking? Authorizing Provider  albuterol [facility] HFA) 108 (90 Base)  MCG/ACT inhaler Inhale 2 puffs into the lungs every 6 (six) hours as needed for wheezing or shortness of breath. 02/25/20  Yes Spero Geralds, MD  albuterol (PROVENTIL) (2.5 MG/3ML) 0.083% nebulizer solution Take 3 mLs (2.5 mg total) by nebulization every 6 (six) hours as needed for wheezing or shortness of breath. 09/15/16  Yes Tanda Rockers, MD  Buprenorphine HCl-Naloxone HCl 8-2 MG FILM Place 1 Film under the tongue every other day.  08/06/18  Yes [provider]  diphenhydrAMINE (BENADRYL) 25 MG tablet Take 25 mg by mouth every 6 (six) hours as needed for itching or allergies.   Yes [provider]  FLUoxetine (PROZAC) 20 MG capsule Take 20 mg by mouth daily.   Yes [provider]  fluticasone (FLONASE) 50 MCG/ACT nasal spray Place 1 spray into both nostrils daily. 02/25/20  Yes Spero Geralds, MD  fluticasone (FLOVENT HFA) 110 MCG/ACT inhaler Inhale 2 puffs into the lungs at bedtime. 02/25/20  Yes Spero Geralds, MD  Nebulizers (NEBULIZER COMPRESSOR) MISC Use as directed Dx: 01/10/17  Yes Tanda Rockers, MD  ondansetron (ZOFRAN) 4 MG tablet Take 1 tablet (4 mg total) by mouth every 6 (six) hours. Patient taking differently: Take 4 mg by mouth every 8 (eight) hours as needed  for nausea. 09/15/16  Yes Tanda Rockers, MD  pantoprazole (PROTONIX) 40 MG tablet TAKE 1 TABLET (40 MG TOTAL) BY MOUTH DAILY. TAKE 30-60 MIN BEFORE FIRST MEAL OF THE DAY Patient taking differently: Take 40 mg by mouth at bedtime. Take 30-60 min before first meal of the day 07/07/17  Yes Tanda Rockers, MD  potassium chloride 20 MEQ/15ML (10%) SOLN Take 15 mLs (20 mEq total) by mouth daily. 02/19/20  Yes Richardson Dopp T, PA-C  Spacer/Aero-Holding Chambers (AEROCHAMBER MV) inhaler Use as instructed 10/28/19  Yes Spero Geralds, MD  spironolactone (ALDACTONE) 100 MG tablet Take 100 mg by mouth daily.  10/17/19  Yes [provider]  torsemide (DEMADEX) 20 MG tablet Take 1 tablet (20 mg total) by mouth daily. 01/17/20  Yes Liliane Shi, PA-C     Critical care time: 30 minutes     Sanjuan Dame, MD Internal Medicine PGY-1 Pager: [phone]

## 2020-06-14 NOTE — Progress Notes (Signed)
Nephrology Follow-Up Consult note   Assessment/Recommendations: Gabriel Ramos is a/an 70 y.o. male with a past medical history significant for hepatitis C, alcohol use disorder, liver disease who present w/ AKI  Non-Oliguric AKI: Likely secondary to dehydration and hypotension. BL normal. Urine Na low at 11 which likely indicated poor perfusion and makes ATN less likely at this time but this may develop in the coming days.  He has some urine output and creatinine is improving. -Continue fluids; D5 LR 150 cc/h - continue norepinephrine goal MAP >65 for optimal renal perfusion -Creatinine downtrending -UA and RUS reassuring  -Continue to monitor daily Cr, Dose meds for GFR -Monitor Daily I/Os, Daily weight  -Avoid nephrotoxic medications including NSAIDs and Vanc/Zosyn combo -Currently no indication for HD  Shock: Concerning for septic shock with possible cellulitis based on lower extremities. Antibiotics per primary team.  Continue norepinephrine as above   Metabolic acidosis: Nonanion gap today mild with bicarbonate of 20.  On lactated Ringer's as above.  Continue to monitor  Hyponatremia: Sodium improved to 134 today.  Continue to monitor  hypokalemia: Level 3.3 today undergoing repletion  Hypoalbuminemia: Status post 50 g of albumin yesterday.  Repeat level today.  Hepatitis C: Status post treatment based on notes. Wife states this was a recent diagnosis. Will recheck hepatitis C titers    Recommendations conveyed to primary service.    [facility] 06/14/2020 9:54 AM  ___________________________________________________________  CC: AKI  Interval History/Subjective: Patient relatively stable over the past 24 hours on norepinephrine.  Creatinine improved from 6.3 to 5.3 today.  530 cc of urine output documented.  Patient feels about the same as yesterday.  Remains ill   Medications:  Current Facility-Administered Medications   Medication Dose Route Frequency Provider Last Rate Last Admin  . 0.9 %  sodium chloride infusion  250 mL Intravenous Continuous Wyvonnia Dusky, MD 20 mL/hr at 06/13/20 1102 250 mL at 06/13/20 1102  . aztreonam (AZACTAM) 0.5 g in dextrose 5 % 50 mL IVPB  0.5 g Intravenous Q8H Kara Mead V, MD 100 mL/hr at 06/14/20 0510 0.5 g at 06/14/20 0510  . Chlorhexidine Gluconate Cloth 2 % PADS 6 each  6 each Topical Daily Stretch, Marily Lente, MD      . dextrose 5 % in lactated ringers infusion   Intravenous Continuous Stretch, Marily Lente, MD 150 mL/hr at 06/14/20 0106 New Bag at 06/14/20 0106  . docusate sodium (COLACE) capsule 100 mg  100 mg Oral BID PRN Merlene Laughter F, NP      . fentaNYL (SUBLIMAZE) injection 25 mcg  25 mcg Intravenous Q3H PRN Rigoberto Noel, MD      . heparin injection 5,000 Units  5,000 Units Subcutaneous Q8H Merlene Laughter F, NP   5,000 Units at 06/14/20 0517  . ipratropium-albuterol (DUONEB) 0.5-2.5 (3) MG/3ML nebulizer solution 3 mL  3 mL Nebulization Q4H PRN Rigoberto Noel, MD      . magnesium sulfate IVPB 2 g 50 mL  2 g Intravenous Once Sanjuan Dame, MD      . midodrine (PROAMATINE) tablet 5 mg  5 mg Oral TID WC Reesa Chew, MD   5 mg at 06/14/20 0800  . mometasone-formoterol (DULERA) 100-5 MCG/ACT inhaler 2 puff  2 puff Inhalation BID Kara Mead V, MD      . norepinephrine (LEVOPHED) 4mg  in 237mL premix infusion  2-10 mcg/min Intravenous Titrated Wyvonnia Dusky, MD 7.5 mL/hr at 06/13/20 1900 2 mcg/min  at 06/13/20 1900  . pantoprazole (PROTONIX) injection 40 mg  40 mg Intravenous QHS Merlene Laughter F, NP   40 mg at 06/13/20 2141  . polyethylene glycol (MIRALAX / GLYCOLAX) packet 17 g  17 g Oral Daily PRN Merlene Laughter F, NP      . vancomycin variable dose per unstable renal function (pharmacist dosing)   Does not apply See admin instructions Rigoberto Noel, MD          Review of Systems: 10 systems reviewed and negative except per interval  history/subjective  Physical Exam: Vitals:   06/14/20 0645 06/14/20 0800  BP: 106/60   Pulse: 91 92  Resp: 15 18  Temp:    SpO2: 100%    No intake/output data recorded.  Intake/Output Summary (Last 24 hours) at 06/14/2020 0954 Last data filed at 06/14/2020 0600 Gross per 24 hour  Intake 1232.5 ml  Output 530 ml  Net 702.5 ml   General: Chronically ill appearing, lying in bed HEENT: anicteric sclera, oropharynx clear without lesions CV: tachycardia, no obvious murmur, 1+ edema in the bilateral lower extremities Lungs: Bilateral chest rise, normal work of breathing Abd: soft, non-tender, non-distended Skin:  Bilateral lower extremity erythema improved today, scattered abrasions, no obvious rashes otherwise Psych: alert, engaged, appropriate mood and affect Musculoskeletal: no obvious deformities Neuro: normal speech, no gross focal deficits    Test Results I personally reviewed new and old clinical labs and radiology tests Lab Results  Component Value Date   NA 134 (L) 06/14/2020   K 3.3 (L) 06/14/2020   CL 99 06/14/2020   CO2 20 (L) 06/14/2020   BUN 61 (H) 06/14/2020   CREATININE 5.30 (H) 06/14/2020   GFR 83.16 12/16/2019   CALCIUM 6.9 (L) 06/14/2020   ALBUMIN 1.7 (L) 06/13/2020   PHOS 4.7 (H) 06/14/2020

## 2020-06-15 DIAGNOSIS — I959 Hypotension, unspecified: Secondary | ICD-10-CM | POA: Diagnosis present

## 2020-06-15 DIAGNOSIS — L03119 Cellulitis of unspecified part of limb: Secondary | ICD-10-CM | POA: Diagnosis not present

## 2020-06-15 DIAGNOSIS — B182 Chronic viral hepatitis C: Secondary | ICD-10-CM | POA: Diagnosis not present

## 2020-06-15 DIAGNOSIS — K7460 Unspecified cirrhosis of liver: Secondary | ICD-10-CM | POA: Diagnosis present

## 2020-06-15 DIAGNOSIS — E43 Unspecified severe protein-calorie malnutrition: Secondary | ICD-10-CM | POA: Diagnosis not present

## 2020-06-15 DIAGNOSIS — E871 Hypo-osmolality and hyponatremia: Secondary | ICD-10-CM | POA: Diagnosis not present

## 2020-06-15 DIAGNOSIS — K7030 Alcoholic cirrhosis of liver without ascites: Secondary | ICD-10-CM | POA: Diagnosis not present

## 2020-06-15 DIAGNOSIS — A419 Sepsis, unspecified organism: Secondary | ICD-10-CM | POA: Diagnosis not present

## 2020-06-15 DIAGNOSIS — N179 Acute kidney failure, unspecified: Secondary | ICD-10-CM | POA: Diagnosis not present

## 2020-06-15 DIAGNOSIS — R6521 Severe sepsis with septic shock: Secondary | ICD-10-CM | POA: Diagnosis not present

## 2020-06-15 DIAGNOSIS — E872 Acidosis: Secondary | ICD-10-CM | POA: Diagnosis not present

## 2020-06-15 LAB — HCV RNA QUANT: HCV Quantitative: NOT DETECTED IU/mL (ref >50)

## 2020-06-15 LAB — BASIC METABOLIC PANEL
Anion gap: 13 (ref 5–15)
BUN: 49 mg/dL — ABNORMAL HIGH (ref 8–23)
CO2: 20 mmol/L — ABNORMAL LOW (ref 22–32)
Calcium: 7.4 mg/dL — ABNORMAL LOW (ref 8.9–10.3)
Chloride: 100 mmol/L (ref 98–111)
Creatinine, Ser: 3.72 mg/dL — ABNORMAL HIGH (ref 0.61–1.24)
GFR, Estimated: 17 mL/min — ABNORMAL LOW (ref >60)
Glucose, Bld: 104 mg/dL — ABNORMAL HIGH (ref 70–99)
Potassium: 3.5 mmol/L (ref 3.5–5.1)
Sodium: 133 mmol/L — ABNORMAL LOW (ref 135–145)

## 2020-06-15 LAB — RENAL FUNCTION PANEL
Albumin: 2 g/dL — ABNORMAL LOW (ref 3.5–5.0)
Anion gap: 13 (ref 5–15)
BUN: 52 mg/dL — ABNORMAL HIGH (ref 8–23)
CO2: 20 mmol/L — ABNORMAL LOW (ref 22–32)
Calcium: 7.4 mg/dL — ABNORMAL LOW (ref 8.9–10.3)
Chloride: 102 mmol/L (ref 98–111)
Creatinine, Ser: 4.22 mg/dL — ABNORMAL HIGH (ref 0.61–1.24)
GFR, Estimated: 15 mL/min — ABNORMAL LOW (ref >60)
Glucose, Bld: 84 mg/dL (ref 70–99)
Phosphorus: 3.4 mg/dL (ref 2.5–4.6)
Potassium: 2.5 mmol/L — CL (ref 3.5–5.1)
Sodium: 135 mmol/L (ref 135–145)

## 2020-06-15 LAB — MAGNESIUM: Magnesium: 2 mg/dL (ref 1.7–2.4)

## 2020-06-15 LAB — GLUCOSE, CAPILLARY
Glucose-Capillary: 112 mg/dL — ABNORMAL HIGH (ref 70–99)
Glucose-Capillary: 115 mg/dL — ABNORMAL HIGH (ref 70–99)
Glucose-Capillary: 118 mg/dL — ABNORMAL HIGH (ref 70–99)
Glucose-Capillary: 126 mg/dL — ABNORMAL HIGH (ref 70–99)
Glucose-Capillary: 141 mg/dL — ABNORMAL HIGH (ref 70–99)
Glucose-Capillary: 86 mg/dL (ref 70–99)

## 2020-06-15 LAB — POTASSIUM: Potassium: 3.1 mmol/L — ABNORMAL LOW (ref 3.5–5.1)

## 2020-06-15 LAB — RHEUMATOID FACTOR: Rheumatoid fact SerPl-aCnc: 20.4 IU/mL — ABNORMAL HIGH (ref <14.0)

## 2020-06-15 MED ORDER — ENSURE ENLIVE PO LIQD
237.0000 mL | Freq: Three times a day (TID) | ORAL | Status: DC
  Administered 2020-06-15 – 2020-06-16 (×4): 237 mL via ORAL

## 2020-06-15 MED ORDER — POTASSIUM CHLORIDE 10 MEQ/100ML IV SOLN
10.0000 meq | INTRAVENOUS | Status: AC
  Administered 2020-06-15 (×2): 10 meq via INTRAVENOUS
  Filled 2020-06-15 (×2): qty 100

## 2020-06-15 MED ORDER — JUVEN PO PACK
1.0000 | PACK | Freq: Two times a day (BID) | ORAL | Status: DC
  Administered 2020-06-16 (×2): 1 via ORAL
  Filled 2020-06-15 (×2): qty 1

## 2020-06-15 MED ORDER — CEFAZOLIN SODIUM-DEXTROSE 2-4 GM/100ML-% IV SOLN
2.0000 g | Freq: Two times a day (BID) | INTRAVENOUS | Status: AC
  Administered 2020-06-15 – 2020-06-17 (×6): 2 g via INTRAVENOUS
  Filled 2020-06-15 (×7): qty 100

## 2020-06-15 MED ORDER — PROSOURCE PLUS PO LIQD
30.0000 mL | Freq: Two times a day (BID) | ORAL | Status: DC
  Administered 2020-06-16 (×2): 30 mL via ORAL
  Filled 2020-06-15 (×4): qty 30

## 2020-06-15 MED ORDER — POTASSIUM CHLORIDE 10 MEQ/100ML IV SOLN
10.0000 meq | INTRAVENOUS | Status: AC
  Administered 2020-06-15 (×4): 10 meq via INTRAVENOUS
  Filled 2020-06-15 (×5): qty 100

## 2020-06-15 NOTE — Consult Note (Signed)
[place] Nurse Consult Note: Reason for Consult: "unstageable; worsening cellulitis" Wound type: 1. Sacral wound; Stage 2 Pressure Injury 2. Right buttock wound; Stage 2 Pressure Injury 3. Left buttock wound; Stage 2 Pressure Injury 4-7. Right dorsal foot; right lateral foot x3; partial thickness ulcerations 8. Right 5th toe; arterial ulceration 9. Left 2nd toe; arterial ulceration 10. Left 5th toe; arterial ulceration 11. Right great toe: arterial ulceration 12. Right medial heel: full thickness ulceration; most likely related to arterial disease 13. Left  lateral calf; partial thickness ulceration 14. Right medial calf: partial thickness ulceration    Pressure Injury POA: Yes/No/NA Measurement:  1. Sacrum;2cm x 1.5cm x 0.2cm; 100% pink 2. Right buttock: 2cmx 1cm x 0.1cm x 100% yellow with epithelial buds 3. Left buttock: 1cm x 0.5cm x 0.1cm x 100% yellow with epithelial buds 4.-7. Largest right dorsal foot: 8cm x 6cm x 0.1cm; right lateral foot average of each 1.0cm x 0.5cm x 0.1cm  8. 0.3cm x 0.3cm x 0cm : 100% black 9. 0.1cm x 0.2cm x 0cm: 100% black 10. 0.2cm x 0.2cm x 0cm: 100% black 11. 0.1cm x 0.2cm x 0cm : 100% black 12. 1.5cm x 1.0cm x 0cm: 100% black 13. 5cm x 2.5cm x 0.2cm; 100% yellow 14. Did not get measurement: yellow   Wound bed: Drainage (amount, consistency, odor) minimal from all sites, not purulent  Periwound: intact; weak pulses  Dressing procedure/placement/frequency: 1. Paint black areas on the toe tips and the left heel with betadine daily, allow to air dry   2. Clean sacral/buttock wounds (3) with saline; protect with silicone foam; change foam every 3 days; lift to inspect for acute change each shift   3. Apply single layer of xeroform to the right dorsal foot wound; right lateral foot wounds; right medial and left leg wounds, top with ABD or dry gauze. Secure with kerlix   4. Paint bilateral knees with betadine; allow to air dry. Cover with silicone foam.  Ok to peel back foam daily to paint with betadine.   Discussed POC with patient and bedside nurse.  Re consult if needed, will not follow at this time. Thanks  Ketsia Linebaugh [employer], RN,CWOCN, CNS, [place] [phone])

## 2020-06-15 NOTE — Progress Notes (Signed)
Initial Nutrition Assessment  DOCUMENTATION CODES:   Severe malnutrition in context of social or environmental circumstances  INTERVENTION:   - Recommend Cortrak placement and initiation of enteral nutrition given poor appetite and significantly increased kcal and protein needs related to wound healing and chronic illness. Pt has declined Cortrak at this time and would prefer to try to eat.  - Ensure Enlive po TID, each supplement provides 350 kcal and 20 grams of protein  - 1 packet Juven BID, each packet provides 95 calories, 2.5 grams of protein, and 9.8 grams of carbohydrate; also contains 7 grams of L-arginine and L-glutamine, 300 mg vitamin C, 15 mg vitamin E, 1.2 mcg vitamin B-12, 9.5 mg zinc, 200 mg calcium, and 1.5 g  Calcium Beta-hydroxy-Beta-methylbutyrate to support wound healing  - ProSource Plus 30 ml po BID, each supplement provides 100 kcal and 15 grams of protein  - Magic Cup TID with meals, each supplement provides 290 kcal and 9 grams of protein  - Encourage adequate PO intake  Monitor magnesium, potassium, and phosphorus daily for at least 3 days, MD to replete as needed, as pt is at risk for refeeding syndrome given severe malnutrition, EtOH abuse.  NUTRITION DIAGNOSIS:   Severe Malnutrition related to social / environmental circumstances (EtOH abuse, poor social support) as evidenced by severe muscle depletion,severe fat depletion.  GOAL:   Patient will meet greater than or equal to 90% of their needs  MONITOR:   PO intake,Supplement acceptance,Diet advancement,Labs,Weight trends,Skin,I & O's  REASON FOR ASSESSMENT:   Rounds    ASSESSMENT:   70 year old male who presented to the ED on 1/07with lethargy. PMH of EtOH abuse, opiate use disorder, hepatitis C with cirrhosis, chronic pedal edema, HTN. Pt admitted with AKI, septic shock.   Discussed pt with RN and during ICU rounds. Per discussion with CCM, okay to advance pt's diet from clear liquid to full  liquids. Per RN, pt with very minimal PO intake and has tolerated a few sips of juice today. Levophed off this AM.  Spoke with CCM prior to entering pt's room. CCM has ordered both SLP and Palliative Care consult. Pt with poor prognosis per CCM.  Spoke with pt at bedside. Pt reports no intake of solid foods for "several weeks" due to being sick. Pt states that over the last few weeks, he has just been drinking liquids (soda and juice). Pt reports that prior to this, he did not have much of an appetite but did eat small portions at meal times.  Pt is unsure of his UBW and does not know if he has lost any weight. He reports that his weight fluctuates related to fluid status. Reviewed weight history in chart. Pt with a 2.7 kg weight loss since 03/25/20. This is a 4.4% weight loss which is not significant for timeframe. Suspect pt with additional weight loss that is being masked by edema.  Pt reports that he utilizes a walker for mobility at home.  Medications reviewed and include: folic acid, IV protonix, thiamine, IV abx, IV KCl 10 mEq x 2 runs IVF: D5 in LR @ 85 ml/hr  Labs reviewed: sodium 133, BUN 49, creatinine 3.72, hemoglobin 8.3 CBG's: 74-166 x 24 hours  UOP: 630 ml x 24 hours I/O's: +3.0 L since admit  NUTRITION - FOCUSED PHYSICAL EXAM:  Flowsheet Row Most Recent Value  Orbital Region Moderate depletion  Upper Arm Region Moderate depletion  Thoracic and Lumbar Region Severe depletion  Buccal Region Severe depletion  [place] Region  Severe depletion  Clavicle Bone Region Severe depletion  Clavicle and Acromion Bone Region Severe depletion  Scapular Bone Region Unable to assess  Dorsal Hand Severe depletion  Patellar Region Severe depletion  Anterior Thigh Region Severe depletion  Posterior Calf Region Moderate depletion  Edema (RD Assessment) Mild  [BLE]  Hair Reviewed  Eyes Reviewed  Mouth Reviewed  Skin Reviewed  Nails Reviewed       Diet Order:   Diet Order             Diet full liquid Room service appropriate? Yes; Fluid consistency: Thin  Diet effective now                 EDUCATION NEEDS:   Education needs have been addressed  Skin:  Skin Assessment: Skin Integrity Issues: Stage II: left buttock, sacrum, coccyx, right foot x 2 Stage III: right foot Unstageable: left heel, right knee, left knee Other: non-pressure wounds to left 2nd toe, left 4th toe, left pretibial, right foot x 2, right great toe  Last BM:  2020-06-13  Height:   Ht Readings from Last 1 Encounters:  06/13/20 5\' 10"  (1.778 m)    Weight:   Wt Readings from Last 1 Encounters:  06/15/20 59.1 kg    BMI:  Body mass index is 18.69 kg/m.  Estimated Nutritional Needs:   Kcal:  2100-2300  Protein:  110-130 grams  Fluid:  1.8 L/day    Gustavus Bryant, MS, RD, LDN Inpatient Clinical Dietitian Please see AMiON for contact information.

## 2020-06-15 NOTE — Progress Notes (Signed)
Nephrology Follow-Up Consult note   Assessment/Recommendations: Gabriel Ramos is a/an 70 y.o. male with a past medical history significant for hepatitis C, alcohol use disorder, liver disease who present w/ AKI  Non-Oliguric AKI: Likely secondary to dehydration and hypotension. BL normal. Urine Na low at 11 which likely indicated poor perfusion and makes ATN less likely at this time.  He has some urine output and creatinine continues to improve. -Continue fluids at 85 cc/hr - off pressors today -Creatinine downtrending -UA and RUS reassuring  - will follow  Shock: Concerning for septic shock with possible cellulitis based on lower extremities. Antibiotics per primary team.  Continue norepinephrine as above   Metabolic acidosis: improved  Hyponatremia:  improved to 134 today.  Continue to monitor  hypokalemia: Level high 2's today undergoing repletion  Hepatitis C: Status post treatment based on notes. Wife states this was a recent diagnosis. Will recheck hepatitis C titers    Sandy Salaam [facility] 06/15/2020 12:07 PM  ___________________________________________________________  Interval History/Subjective: pt has no new c/o's   Medications:  Current Facility-Administered Medications  Medication Dose Route Frequency Provider Last Rate Last Admin  . 0.9 %  sodium chloride infusion  250 mL Intravenous Continuous Wyvonnia Dusky, MD 20 mL/hr at 06/13/20 1102 250 mL at 06/13/20 1102  . albuterol (VENTOLIN HFA) 108 (90 Base) MCG/ACT inhaler 1-2 puff  1-2 puff Inhalation Q6H PRN Sanjuan Dame, MD      . ceFAZolin (ANCEF) IVPB 2g/100 mL premix  2 g Intravenous Q12H Sanjuan Dame, MD      . Chlorhexidine Gluconate Cloth 2 % PADS 6 each  6 each Topical Daily Stretch, Marily Lente, MD   6 each at 06/14/20 1000  . dextrose 5 % in lactated ringers infusion   Intravenous Continuous Stretch, Marily Lente, MD 150 mL/hr at 06/14/20 2358 Restarted at 06/14/20  2358  . docusate sodium (COLACE) capsule 100 mg  100 mg Oral BID PRN Merlene Laughter F, NP      . feeding supplement (ENSURE ENLIVE / ENSURE PLUS) liquid 237 mL  237 mL Oral TID BM Hunsucker, Bonna Gains, MD      . fentaNYL (SUBLIMAZE) injection 25 mcg  25 mcg Intravenous Q3H PRN Rigoberto Noel, MD      . folic acid (FOLVITE) tablet 1 mg  1 mg Oral Daily Sanjuan Dame, MD   1 mg at 06/15/20 0934  . heparin injection 5,000 Units  5,000 Units Subcutaneous Q8H Merlene Laughter F, NP   5,000 Units at 06/15/20 0502  . midodrine (PROAMATINE) tablet 5 mg  5 mg Oral TID WC Reesa Chew, MD   5 mg at 06/15/20 0800  . mometasone-formoterol (DULERA) 100-5 MCG/ACT inhaler 2 puff  2 puff Inhalation BID Kara Mead V, MD      . norepinephrine (LEVOPHED) 4mg  in 265mL premix infusion  2-10 mcg/min Intravenous Titrated Wyvonnia Dusky, MD   Paused at 06/15/20 [phone]  . pantoprazole (PROTONIX) injection 40 mg  40 mg Intravenous QHS Merlene Laughter F, NP   40 mg at 06/14/20 2222  . polyethylene glycol (MIRALAX / GLYCOLAX) packet 17 g  17 g Oral Daily PRN Merlene Laughter F, NP      . thiamine tablet 50 mg  50 mg Oral Daily Sanjuan Dame, MD   50 mg at 06/15/20 0935      Review of Systems: 10 systems reviewed and negative except per interval history/subjective  Physical Exam: Vitals:   06/15/20 0700  06/15/20 1135  BP: (!) 89/67   Pulse: 86   Resp: 16   Temp:  97.6 F (36.4 C)  SpO2: 100%    No intake/output data recorded.  Intake/Output Summary (Last 24 hours) at 06/15/2020 1207 Last data filed at 06/15/2020 0600 Gross per 24 hour  Intake 1984.93 ml  Output 630 ml  Net 1354.93 ml   General: Chronically ill appearing, lying in bed HEENT: anicteric sclera, oropharynx clear without lesions CV: tachycardia, no obvious murmur Lungs: Bilateral chest rise, normal work of breathing Abd: soft, non-tender, non-distended, no sig ascites Skin:  Bilateral lower extremity erythema persists, mild edema in  legs today, scattered abrasions Psych: alert, engaged, appropriate mood and affect Musculoskeletal: no obvious deformities Neuro: normal speech, no gross focal deficits    Test Results I personally reviewed new and old clinical labs and radiology tests Lab Results  Component Value Date   NA 135 06/15/2020   K 3.1 (L) 06/15/2020   CL 102 06/15/2020   CO2 20 (L) 06/15/2020   BUN 52 (H) 06/15/2020   CREATININE 4.22 (H) 06/15/2020   GFR 83.16 12/16/2019   CALCIUM 7.4 (L) 06/15/2020   ALBUMIN 2.0 (L) 06/15/2020   PHOS 3.4 06/15/2020

## 2020-06-15 NOTE — Progress Notes (Addendum)
NAME:  SUDEYS PRUNER, MRN:  [id number], DOB:  January 04, 1951, LOS: 2 ADMISSION DATE:  06/11/2020, CONSULTATION DATE:  06/15/2020  REFERRING MD:  ED, mesner, CHIEF COMPLAINT: Hypotension, renal failure  Brief History:  70 year old EtOH, opiate use disorder, 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 admitted with bilateral lower extremity cellulitis, hypertension, nausea and vomiting for 2 weeks and AKI  History of Present Illness:  He has hep C/EtOH related cirrhosis, chronic active hepatitis and chronic pedal edema likely on this basis.  I have reviewed prior notes from infectious disease, GI, cardiology.  Cardiology work-up has shown normal LVEF and normal right heart pressures.  He is maintained on torsemide and Aldactone.  Last creatinine 03/2020 was normal. For the last 2 weeks he has developed nausea and vomiting and was unable to take much orally.  He reports drinking Smirnoff lites but for the past month has changed to a shot of brandy every morning to prevent shakes.  For the past few days he was unable to take this.  Developed dyspnea which brought him to the emergency room found to have AKI and hypotension, blood pressure improved with 3 L of fluids and has not required pressors so far. Received empiric antibiotics for lactate of 5.8 with aztreonam/vancomycin/Flagyl. PCCM consulted for further management  Past Medical History:  Hep C/EtOH cirrhosis -no varices on last EGD, no overt hepatic encephalopathy Chronic hypoalbuminemia with pedal edema Normal LV function, normal right heart pressures EtOH use disorder Opiate use disorder, on Suboxone Asthma  Significant Hospital Events:  1/8 > Admission  Consults:  Nephrology  Procedures:  n/a  Significant Diagnostic Tests:  CT head 1/7 >> No acute intracranial abnormality. Generalized atrophy and chronic small vessel ischemia. Chronic paranasal sinus disease. Bubbly debris within the right greater than left maxillary sinus may be acute  on chronic sinus disease.  [place] abd 1/8 >> No acute findings. No gallstones. No evidence of acute cholecystitis. No ascites. Fatty infiltration of the liver.   Renal US 1/8 >> Technically challenging exam given the presence of extensive support devices and patient's or clinical status. Likely increased bilateral renal cortical echogenicity compatible with medical renal disease. Minimally complex cyst in the upper pole right kidney measuring 1.4cm, not significantly changed from comparison Imaging in 2018. No dedicated follow-up imaging is typically warranted. Additional simple appearing cyst is seen in the upper pole left kidney as well  Micro Data:  BCx 1/8 >> NGTD UCx 1/8 >> NGTD   Antimicrobials:  Aztreonam 1/8 >> Vancomycin 1/8>> Flagyl 1/8  Interim History / Subjective:  No acute events overnight. Patient states he has not eaten anything, no appetite. Denies nausea, vomiting.  Objective   Blood pressure (!) 89/67, pulse 86, temperature 98.1 F (36.7 C), temperature source Axillary, resp. rate 16, height 5\' 10"  (1.778 m), weight 59.1 kg, SpO2 100 %.        Intake/Output Summary (Last 24 hours) at 06/15/2020 0843 Last data filed at 06/15/2020 0600 Gross per 24 hour  Intake 2688.94 ml  Output 630 ml  Net 2058.94 ml   Filed Weights   06/17/2020 2320 06/14/20 0500 06/15/20 0500  Weight: 61 kg 55.9 kg 59.1 kg    Examination: General: Chronically ill-appearing, no acute distress HENT: Normocephalic, atraumatic. Lungs: Normal WOB. Bilateral wheezing upper lung fields. Cardiovascular: Regular rate, rhythm. No m/r/g Abdomen: Soft, non-tender. No ascites appreciated Extremities: Erythema up to mid shin, 1+ edema, tender, gangrenous 1 inch area over the left third toe Neuro: Awake, alert,  oriented x4. GU: No Foley  Resolved Hospital Problem list     Assessment & Plan:  Mr. Mabie is 70yo male w/ cirrhosis, alcohol and opiate use disorder admitted 1/8 w/ septic shock from  bilateral lower extremity cellulitis w/ AKI.  #Septic shock 2/2 BLE cellulitis Patient weaning off pressors, continues to have some pain in bilateral legs. Otherwise stable on broad-spectrum antibiotics. Will continue with fluids until he can tolerate po. Will work on adequate nutrition for good wound healing. - C/w vancomycin, aztreonam - Midodrine 5mg  TID - IVF: D5 w/ LR, can stop if tolerating po - MAP goal >65  #Acute kidney injury #Anion gap metabolic acidosis Nephrology following, appreciate recs. sCr continues to improve, 4.2 < 5.3. UOP 0.4 cc/kg/hr. Bicarb stable at 20, AG decreasing. Will ensure renal perfusion w/ midodrine, MAP's have been >65 off pressors. - C/w IVF  - Daily BMP - Strict I&O's - Avoid nephrotoxic medications  #EtOH/HepC cirrhosis #Chronic pedal edema Afebrile, no signs or symptoms concerning for SBP/abdominal infection that is not covered on current antibiotic regimen. Continue folate, thiamine. - C/w thiamine, folate qd - Zofran PRN - Hold diuretics   #Asthma Weaned off O2 this AM. Continue w/ nebs PRN. - Duonebs q4 PRN  #Pain/chronic opiate use disorder on Suboxone -Fentanyl as needed.  Best practice (evaluated daily)  Diet: Clear liquids Pain/Anxiety/Delirium protocol (if indicated): Fentanyl as needed VAP protocol (if indicated): N/A DVT prophylaxis: SubQ heparin GI prophylaxis: N/A Glucose control: N/A Mobility: Bedrest Disposition: ICU  Goals of Care:  Last date of multidisciplinary goals of care discussion:NA  Family and staff present: NA Summary of discussion: NA Follow up goals of care discussion due: NA Code Status: Full  Labs   CBC: Recent Labs  Lab 06/13/20 0141 06/13/20 0154 06/13/20 1128 06/14/20 0128  WBC 8.0  --  6.9 7.8  NEUTROABS 7.4  --   --   --   HGB 10.3* 10.5* 8.6* 8.3*  HCT 30.9* 31.0* 23.8* 22.6*  MCV 101.0*  --  96.7 97.4  PLT 237  --  174 136*    Basic Metabolic Panel: Recent Labs  Lab  06/13/20 0141 06/13/20 0154 06/13/20 1128 06/13/20 2022 06/14/20 0128 06/15/20 0129 06/15/20 0742  NA 131* 130*  --  134* 134* 135  --   K 4.6 4.4  --  3.4* 3.3* 2.5* 3.1*  CL 96*  --   --  98 99 102  --   CO2 13*  --   --  21* 20* 20*  --   GLUCOSE 125*  --   --  69* 124* 84  --   BUN 67*  --   --  63* 61* 52*  --   CREATININE 7.20*  --  6.27* 5.68* 5.30* 4.22*  --   CALCIUM 7.8*  --   --  7.0* 6.9* 7.4*  --   MG 1.6*  --   --   --  1.6*  --   --   PHOS  --   --   --   --  4.7* 3.4  --    GFR: Estimated Creatinine Clearance: 14.2 mL/min (A) (by C-G formula based on SCr of 4.22 mg/dL (H)). Recent Labs  Lab 06/13/20 0141 06/13/20 0425 06/13/20 0840 06/13/20 1128 06/14/20 0128  WBC 8.0  --   --  6.9 7.8  LATICACIDVEN 5.8* 4.1* 2.8*  --   --     Liver Function Tests: Recent Labs  Lab 06/13/20 0141 06/14/20  0128 06/15/20 0129  AST 35  --   --   ALT 20  --   --   ALKPHOS 127*  --   --   BILITOT 2.0*  --   --   PROT 4.6*  --   --   ALBUMIN 1.7* 1.5* 2.0*   Recent Labs  Lab 06/13/20 1128  LIPASE 16   Recent Labs  Lab 06/13/20 0141  AMMONIA 48*    ABG    Component Value Date/Time   HCO3 12.1 (L) 06/13/2020 0154   TCO2 13 (L) 06/13/2020 0154   ACIDBASEDEF 12.0 (H) 06/13/2020 0154   O2SAT 83.0 06/13/2020 0154     Coagulation Profile: No results for input(s): INR, PROTIME in the last 168 hours.  Cardiac Enzymes: Recent Labs  Lab 06/13/20 1128  CKTOTAL 208    HbA1C: No results found for: HGBA1C  CBG: Recent Labs  Lab 06/14/20 1529 06/14/20 2004 06/14/20 2316 06/15/20 0309 06/15/20 0748  GLUCAP 166* 76 74 86 115*    Review of Systems:   Pain in lower extremities Nausea, vomiting, unable to take p.o. Generalized weakness No fevers, chills, chest pain, palpitations, shortness of breath  Past Medical History:  He,  has a past medical history of Abnormal CXR (09/16/2016), Alcohol dependence with unspecified alcohol-induced disorder ([employer]),  Alcoholic hepatitis without ascites, ALLERGIC RHINITIS (09/14/2006), Anxiety, Asthma, chronic, severe persistent, uncomplicated (0000000), BPH (benign prostatic hyperplasia), Concentric left ventricular hypertrophy, DEPRESSION (09/14/2006), Echocardiogram, GERD (gastroesophageal reflux disease), Gout (03/28/2012), Hepatitis C, History of nuclear stress test, History of paroxysmal supraventricular tachycardia, Hypertension, Hypoalbuminemia, Hypokalemia, Nausea, Opioid abuse ([place]), Simple chronic bronchitis ([place]), SVT (supraventricular tachycardia) ([place]), SVT (supraventricular tachycardia) ([place]) (08/12/2018), and Vertigo.   Surgical History:   Past Surgical History:  Procedure Laterality Date  . polyps remvoed from nose    . RIGHT HEART CATH N/A 03/13/2020   Procedure: RIGHT HEART CATH;  Surgeon: Nelva Bush, MD;  Location: [place] [facility];  Service: Cardiovascular;  Laterality: N/A;  . UPPER GI ENDOSCOPY     approx 10 years     Social History:   reports that he has never smoked. He has never used smokeless tobacco. He reports current alcohol use of about 3.0 standard drinks of alcohol per week. He reports current drug use. Frequency: 7.00 times per week. Drug: Marijuana.   Family History:  His family history includes Asthma in his brother; Heart disease in his brother and father; Heart murmur in his mother; Liver cancer in his brother. There is no history of Colon cancer, Esophageal cancer, Rectal cancer, or Stomach cancer.   Allergies Allergies  Allergen Reactions  . Aspirin Shortness Of Breath and Swelling    REACTION: ASTHMA  . Penicillins Swelling    Makes lips and tongue swell up Has patient had a PCN reaction causing immediate rash, facial/tongue/throat swelling, SOB or lightheadedness with hypotension: yes Has patient had a PCN reaction causing severe rash involving mucus membranes or skin necrosis: no Has patient had a PCN reaction that required hospitalization: no Has  patient had a PCN reaction occurring within the last 10 years: no, a little over 10 years If all of the above answers are "NO", then may proceed with Cephalosporin use.      Home Medications  Prior to Admission medications   Medication Sig Start Date End Date Taking? Authorizing Provider  albuterol (PROAIR HFA) 108 (90 Base) MCG/ACT inhaler Inhale 2 puffs into the lungs every 6 (six) hours as needed for wheezing or  shortness of breath. 02/25/20  Yes Spero Geralds, MD  albuterol (PROVENTIL) (2.5 MG/3ML) 0.083% nebulizer solution Take 3 mLs (2.5 mg total) by nebulization every 6 (six) hours as needed for wheezing or shortness of breath. 09/15/16  Yes Tanda Rockers, MD  Buprenorphine HCl-Naloxone HCl 8-2 MG FILM Place 1 Film under the tongue every other day.  08/06/18  Yes [provider]  diphenhydrAMINE (BENADRYL) 25 MG tablet Take 25 mg by mouth every 6 (six) hours as needed for itching or allergies.   Yes [provider]  FLUoxetine (PROZAC) 20 MG capsule Take 20 mg by mouth daily.   Yes [provider]  fluticasone (FLONASE) 50 MCG/ACT nasal spray Place 1 spray into both nostrils daily. 02/25/20  Yes Spero Geralds, MD  fluticasone (FLOVENT HFA) 110 MCG/ACT inhaler Inhale 2 puffs into the lungs at bedtime. 02/25/20  Yes Spero Geralds, MD  Nebulizers (NEBULIZER COMPRESSOR) MISC Use as directed Dx: 01/10/17  Yes Tanda Rockers, MD  ondansetron (ZOFRAN) 4 MG tablet Take 1 tablet (4 mg total) by mouth every 6 (six) hours. Patient taking differently: Take 4 mg by mouth every 8 (eight) hours as needed for nausea. 09/15/16  Yes Tanda Rockers, MD  pantoprazole (PROTONIX) 40 MG tablet TAKE 1 TABLET (40 MG TOTAL) BY MOUTH DAILY. TAKE 30-60 MIN BEFORE FIRST MEAL OF THE DAY Patient taking differently: Take 40 mg by mouth at bedtime. Take 30-60 min before first meal of the day 07/07/17  Yes Tanda Rockers, MD  potassium chloride 20 MEQ/15ML (10%) SOLN Take 15 mLs (20 mEq total)  by mouth daily. 02/19/20  Yes Richardson Dopp T, PA-C  Spacer/Aero-Holding Chambers (AEROCHAMBER MV) inhaler Use as instructed 10/28/19  Yes Spero Geralds, MD  spironolactone (ALDACTONE) 100 MG tablet Take 100 mg by mouth daily.  10/17/19  Yes [provider]  torsemide (DEMADEX) 20 MG tablet Take 1 tablet (20 mg total) by mouth daily. 01/17/20  Yes Liliane Shi, PA-C     Critical care time: 25 minutes     Sanjuan Dame, MD Internal Medicine PGY-1 Pager: [phone]

## 2020-06-15 NOTE — Progress Notes (Signed)
[place] Progress Note Patient Name: Gabriel Ramos DOB: [birth year] MRN: [id number]   Date of Service  06/15/2020  HPI/Events of Note  Pt K is 2.5 but I cannot replace it because his B/C is 52/4.22  Off of biacr and on LR 150 ml/hr. 4 lit o2. Improving Creatinine/renal failure.  eICU Interventions  Kcl 10 meq , Q1 hr x 4 doses, follow K post.  Discussed with RN     Intervention Category Intermediate Interventions: Electrolyte abnormality - evaluation and management  Elmer Sow 06/15/2020, 3:19 AM

## 2020-06-16 DIAGNOSIS — E43 Unspecified severe protein-calorie malnutrition: Secondary | ICD-10-CM | POA: Diagnosis not present

## 2020-06-16 DIAGNOSIS — Z7189 Other specified counseling: Secondary | ICD-10-CM | POA: Diagnosis not present

## 2020-06-16 DIAGNOSIS — R1310 Dysphagia, unspecified: Secondary | ICD-10-CM

## 2020-06-16 DIAGNOSIS — N179 Acute kidney failure, unspecified: Secondary | ICD-10-CM | POA: Diagnosis not present

## 2020-06-16 DIAGNOSIS — I959 Hypotension, unspecified: Secondary | ICD-10-CM | POA: Diagnosis not present

## 2020-06-16 DIAGNOSIS — B182 Chronic viral hepatitis C: Secondary | ICD-10-CM | POA: Diagnosis not present

## 2020-06-16 DIAGNOSIS — K7460 Unspecified cirrhosis of liver: Secondary | ICD-10-CM | POA: Diagnosis not present

## 2020-06-16 DIAGNOSIS — R5383 Other fatigue: Secondary | ICD-10-CM | POA: Diagnosis not present

## 2020-06-16 DIAGNOSIS — K7030 Alcoholic cirrhosis of liver without ascites: Secondary | ICD-10-CM | POA: Diagnosis not present

## 2020-06-16 DIAGNOSIS — A419 Sepsis, unspecified organism: Secondary | ICD-10-CM | POA: Diagnosis not present

## 2020-06-16 DIAGNOSIS — E872 Acidosis: Secondary | ICD-10-CM | POA: Diagnosis not present

## 2020-06-16 DIAGNOSIS — Z515 Encounter for palliative care: Secondary | ICD-10-CM | POA: Diagnosis not present

## 2020-06-16 DIAGNOSIS — E871 Hypo-osmolality and hyponatremia: Secondary | ICD-10-CM | POA: Diagnosis not present

## 2020-06-16 DIAGNOSIS — L03119 Cellulitis of unspecified part of limb: Secondary | ICD-10-CM | POA: Diagnosis not present

## 2020-06-16 DIAGNOSIS — R6521 Severe sepsis with septic shock: Secondary | ICD-10-CM | POA: Diagnosis not present

## 2020-06-16 LAB — GLUCOSE, CAPILLARY
Glucose-Capillary: 102 mg/dL — ABNORMAL HIGH (ref 70–99)
Glucose-Capillary: 82 mg/dL (ref 70–99)
Glucose-Capillary: 123 mg/dL — ABNORMAL HIGH (ref 70–99)
Glucose-Capillary: 121 mg/dL — ABNORMAL HIGH (ref 70–99)
Glucose-Capillary: 90 mg/dL (ref 70–99)
Glucose-Capillary: 75 mg/dL (ref 70–99)

## 2020-06-16 LAB — RENAL FUNCTION PANEL
Albumin: 1.7 g/dL — ABNORMAL LOW (ref 3.5–5.0)
Anion gap: 10 (ref 5–15)
BUN: 43 mg/dL — ABNORMAL HIGH (ref 8–23)
CO2: 22 mmol/L (ref 22–32)
Calcium: 7.5 mg/dL — ABNORMAL LOW (ref 8.9–10.3)
Chloride: 104 mmol/L (ref 98–111)
Creatinine, Ser: 3.3 mg/dL — ABNORMAL HIGH (ref 0.61–1.24)
GFR, Estimated: 20 mL/min — ABNORMAL LOW (ref >60)
Glucose, Bld: 99 mg/dL (ref 70–99)
Phosphorus: 1.8 mg/dL — ABNORMAL LOW (ref 2.5–4.6)
Potassium: 3.3 mmol/L — ABNORMAL LOW (ref 3.5–5.1)
Sodium: 136 mmol/L (ref 135–145)

## 2020-06-16 LAB — CBC
HCT: 23.1 % — ABNORMAL LOW (ref 39.0–52.0)
Hemoglobin: 7.9 g/dL — ABNORMAL LOW (ref 13.0–17.0)
MCH: 33.8 pg (ref 26.0–34.0)
MCHC: 34.2 g/dL (ref 30.0–36.0)
MCV: 98.7 fL (ref 80.0–100.0)
Platelets: 77 10*3/uL — ABNORMAL LOW (ref 150–400)
RBC: 2.34 MIL/uL — ABNORMAL LOW (ref 4.22–5.81)
RDW: 16.5 % — ABNORMAL HIGH (ref 11.5–15.5)
WBC: 5.1 10*3/uL (ref 4.0–10.5)
nRBC: 0 % (ref 0.0–0.2)

## 2020-06-16 LAB — C-REACTIVE PROTEIN: CRP: 13.2 mg/dL — ABNORMAL HIGH (ref <1.0)

## 2020-06-16 LAB — MAGNESIUM: Magnesium: 1.7 mg/dL (ref 1.7–2.4)

## 2020-06-16 LAB — SEDIMENTATION RATE: Sed Rate: 13 mm/hr (ref 0–16)

## 2020-06-16 LAB — PHOSPHORUS: Phosphorus: 1.9 mg/dL — ABNORMAL LOW (ref 2.5–4.6)

## 2020-06-16 MED ORDER — ORAL CARE MOUTH RINSE
15.0000 mL | Freq: Two times a day (BID) | OROMUCOSAL | Status: DC
  Administered 2020-06-16 – 2020-06-18 (×6): 15 mL via OROMUCOSAL

## 2020-06-16 MED ORDER — WHITE PETROLATUM EX OINT
TOPICAL_OINTMENT | CUTANEOUS | Status: AC
  Filled 2020-06-16: qty 28.35

## 2020-06-16 MED ORDER — DIPHENHYDRAMINE HCL 25 MG PO CAPS
25.0000 mg | ORAL_CAPSULE | Freq: Four times a day (QID) | ORAL | Status: DC | PRN
  Administered 2020-06-16 – 2020-06-17 (×3): 25 mg via ORAL
  Filled 2020-06-16 (×3): qty 1

## 2020-06-16 MED ORDER — MIDODRINE HCL 5 MG PO TABS
10.0000 mg | ORAL_TABLET | Freq: Three times a day (TID) | ORAL | Status: DC
  Administered 2020-06-16 (×3): 10 mg via ORAL
  Filled 2020-06-16 (×4): qty 2

## 2020-06-16 MED ORDER — POTASSIUM CHLORIDE 10 MEQ/100ML IV SOLN: 10.0000 meq | INTRAVENOUS | Status: DC

## 2020-06-16 MED ORDER — HYDROMORPHONE HCL 1 MG/ML IJ SOLN
0.5000 mg | INTRAMUSCULAR | Status: DC | PRN
  Administered 2020-06-16 – 2020-06-21 (×14): 0.5 mg via INTRAVENOUS
  Filled 2020-06-16 (×15): qty 0.5

## 2020-06-16 NOTE — Progress Notes (Signed)
[place] Progress Note Patient Name: Gabriel Ramos DOB: 26-Feb-1951 MRN: [id number]   Date of Service  06/16/2020  HPI/Events of Note  K+ = 3.3, PO4--- = 1.9 and Creatinine = 3.30. GFR = 20.   eICU Interventions  Defer PO4--- replacement to nephrology service.      Intervention Category Major Interventions: Electrolyte abnormality - evaluation and management  Orry Sigl [place] 06/16/2020, 3:56 AM

## 2020-06-16 NOTE — Progress Notes (Signed)
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  I have reviewed prior notes from infectious disease, GI, cardiology.  Cardiology work-up has shown normal LVEF and normal right heart pressures.  He is maintained on torsemide and Aldactone.  Last creatinine 03/2020 was normal. For the last 2 weeks he has developed nausea and vomiting and was unable to take much orally.  He reports drinking Smirnoff lites but for the past month has changed to a shot of brandy every morning to prevent shakes.  For the past few days he was unable to take this.  Developed dyspnea which brought him to the emergency room found to have AKI and hypotension, blood pressure improved with 3 L of fluids and has not required pressors so far. Received empiric antibiotics for lactate of 5.8 with aztreonam/vancomycin/Flagyl. PCCM consulted for further management  Past Medical History:  Hep C/EtOH cirrhosis -no varices on last EGD, no overt hepatic encephalopathy Chronic hypoalbuminemia with pedal edema Normal LV function, normal right heart pressures EtOH use disorder Opiate use disorder, on Suboxone Asthma  Significant Hospital Events:  1/8 > Admission  Consults:  Nephrology  Procedures:  n/a  Significant Diagnostic Tests:  CT head 1/7 >> No acute intracranial abnormality. Generalized atrophy and chronic small vessel ischemia. Chronic paranasal sinus disease. Bubbly debris within the right greater than left maxillary sinus may be acute  on chronic sinus disease.  [place] abd 1/8 >> No acute findings. No gallstones. No evidence of acute cholecystitis. No ascites. Fatty infiltration of the liver.   Renal US 1/8 >> Technically challenging exam given the presence of extensive support devices and patient's or clinical status. Likely increased bilateral renal cortical echogenicity compatible with medical renal disease. Minimally complex cyst in the upper pole right kidney measuring 1.4cm, not significantly changed from comparison Imaging in 2018. No dedicated follow-up imaging is typically warranted. Additional simple appearing cyst is seen in the upper pole left kidney as well  Micro Data:  BCx 1/8 >> NGTD UCx 1/8 >> NGTD   Antimicrobials:  Aztreonam 1/8 >>1/10 Vancomycin 1/8>>1/10 Flagyl 1/8 Cefazolin 1/11>>  Interim History / Subjective:  No acute events overnight. Patient mentions he was able to drink Ensure yesterday without issues. States he feels his legs are improving.   Objective   Blood pressure 97/63, pulse (!) 109, temperature 98.5 F (36.9 C), temperature source Oral, resp. rate 19, height '5\' 10"'  (1.778 m), weight 57.2 kg, SpO2 99 %.        Intake/Output Summary (Last 24 hours) at 06/16/2020 0720 Last data filed at 06/16/2020 0700 Gross per 24 hour  Intake 2103.93 ml  Output 654 ml  Net 1449.93 ml   Filed Weights   06/14/20 0500 06/15/20 0500 06/16/20 0500  Weight: 55.9 kg 59.1 kg 57.2 kg    Examination: General: Chronically ill-appearing, no acute distress HENT: Normocephalic, atraumatic. Lungs: Normal WOB. Bilateral wheezing upper lung fields. Cardiovascular: Regular rate, rhythm. No m/r/g Abdomen: Soft, non-tender. No ascites appreciated Extremities: BLE erythema improved from yesterday, continues to be tender. Bandages over  wounds in place. Neuro: Awake, alert, oriented x4. GU: No Foley  Resolved Hospital Problem list   Anion gap metabolic acidosis  Assessment & Plan:  Gabriel Ramos is 70yo male  w/ alcohol-related fatty liver disease, alcohol and opiate use disorder admitted 1/8 w/ septic shock from bilateral lower extremity cellulitis w/ AKI.  #Septic shock 2/2 BLE cellulitis Patient w/ minimal pressor support, on 2 Levo. BP difficult to control given acute infection in setting of chronic liver disease. Will increase midodrine today in hopes to d/c levophed. Checking inflammatory markers, can discuss MRI for possible osteo. - C/w cefazolin (day 4) - Increase midodrine 41m TID - F/u ESR, CRP - MAP goal >65  #Acute kidney injury Nephrology following, appreciate recs. Renal function responding well to fluids. UOP 0.5 cc/kg/hr. Hopeful to wean off levophed with increase in midodrine. - Daily RFP - Strict I&O's - Avoid nephrotoxic medications  #EtOH fatty liver disease #Chronic pedal edema Patient has difficult social situation and home and appears to have poor insight into his chronic liver disease. He is open to discussion with Palliative. EGD in March 2021, follows with gastroenterology.  - C/w thiamine, folate qd - Zofran PRN - Hold diuretics  - Palliative consult  #Dysphagia Patient mentions he has had trouble swallowing in the past, says he was told he has a "ring in his throat" that makes swallowing difficult. 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 At bedside yesterday patient drinking Ensure without difficulties. Will have speech evaluate patient. - SLP eval  #Asthma Continue w/ nebs PRN. - Duonebs q4 PRN  #Pain/chronic opiate use disorder on Suboxone Patient has not requested Fentanyl over the last few days. Will switch to dilaudid PRN. - IV dilaudid [id number] PRN  #Hypophosphatemia #Hypokalemia Repleting this AM. Mg normal. Possibly component of refeeding syndrome as he was able to start eating some pudding, Ensure yesterday. Will f/u labs tomorrow. - Mg, Phos, BMP in AM  Best practice (evaluated daily)   Diet: Clear liquids Pain/Anxiety/Delirium protocol (if indicated): Dilaudid PRN VAP protocol (if indicated): N/A DVT prophylaxis: SubQ heparin GI prophylaxis: N/A Glucose control: N/A Mobility: Bedrest Disposition: ICU  Goals of Care:  Last date of multidisciplinary goals of care discussion: Palliative to see today, 1/11 Family and staff present: NA Summary of discussion: NA Follow up goals of care discussion due: NA Code Status: Full  Labs   CBC: Recent Labs  Lab 06/13/20 0141 06/13/20 0154 06/13/20 1128 06/14/20 0128 06/16/20 0216  WBC 8.0  --  6.9 7.8 5.1  NEUTROABS 7.4  --   --   --   --   HGB 10.3* 10.5* 8.6* 8.3* 7.9*  HCT 30.9* 31.0* 23.8* 22.6* 23.1*  MCV 101.0*  --  96.7 97.4 98.7  PLT 237  --  174 136* 77*    Basic Metabolic Panel: Recent Labs  Lab 06/13/20 0141 06/13/20 0154 06/13/20 2022 06/14/20 0128 06/15/20 0129 06/15/20 0742 06/15/20 1300 06/16/20 0216  NA 131*   < > 134* 134* 135  --  133* 136  K 4.6   < > 3.4* 3.3* 2.5* 3.1* 3.5 3.3*  CL 96*  --  98 99 102  --  100 104  CO2 13*  --  21* 20* 20*  --  20* 22  GLUCOSE 125*  --  69* 124* 84  --  104* 99  BUN 67*  --  63* 61* 52*  --  49* 43*  CREATININE 7.20*   < > 5.68* 5.30* 4.22*  --  3.72* 3.30*  CALCIUM 7.8*  --  7.0* 6.9* 7.4*  --  7.4* 7.5*  MG 1.6*  --   --  1.6*  --   --  2.0 1.7  PHOS  --   --   --  4.7* 3.4  --   --  1.9*  1.8*   < > = values in this interval not displayed.   GFR: Estimated Creatinine Clearance: 17.6 mL/min (A) (by C-G formula based on SCr of 3.3 mg/dL (H)). Recent Labs  Lab 06/13/20 0141 06/13/20 0425 06/13/20 0840 06/13/20 1128 06/14/20 0128 06/16/20 0216  WBC 8.0  --   --  6.9 7.8 5.1  LATICACIDVEN 5.8* 4.1* 2.8*  --   --   --     Liver Function Tests: Recent Labs  Lab 06/13/20 0141 06/14/20 0128 06/15/20 0129 06/16/20 0216  AST 35  --   --   --   ALT 20  --   --   --   ALKPHOS 127*  --   --   --   BILITOT 2.0*  --   --   --   PROT 4.6*   --   --   --   ALBUMIN 1.7* 1.5* 2.0* 1.7*   Recent Labs  Lab 06/13/20 1128  LIPASE 16   Recent Labs  Lab 06/13/20 0141  AMMONIA 48*    ABG    Component Value Date/Time   HCO3 12.1 (L) 06/13/2020 0154   TCO2 13 (L) 06/13/2020 0154   ACIDBASEDEF 12.0 (H) 06/13/2020 0154   O2SAT 83.0 06/13/2020 0154     Coagulation Profile: No results for input(s): INR, PROTIME in the last 168 hours.  Cardiac Enzymes: Recent Labs  Lab 06/13/20 1128  CKTOTAL 208    HbA1C: No results found for: HGBA1C  CBG: Recent Labs  Lab 06/15/20 1133 06/15/20 1534 06/15/20 1936 06/15/20 2331 06/16/20 0328  GLUCAP 112* 118* 126* 141* 102*    Review of Systems:   Pain in lower extremities No nausea/vomiting/chest pain/fevers overnight.  Past Medical History:  He,  has a past medical history of Abnormal CXR (09/16/2016), Alcohol dependence with unspecified alcohol-induced disorder ([place]), Alcoholic hepatitis without ascites, ALLERGIC RHINITIS (09/14/2006), Anxiety, Asthma, chronic, severe persistent, uncomplicated (4/80/1655), BPH (benign prostatic hyperplasia), Concentric left ventricular hypertrophy, DEPRESSION (09/14/2006), Echocardiogram, GERD (gastroesophageal reflux disease), Gout (03/28/2012), Hepatitis C, History of nuclear stress test, History of paroxysmal supraventricular tachycardia, Hypertension, Hypoalbuminemia, Hypokalemia, Nausea, Opioid abuse ([place]), Simple chronic bronchitis ([place]), SVT (supraventricular tachycardia) ([place]), SVT (supraventricular tachycardia) ([place]) (08/12/2018), and Vertigo.   Surgical History:   Past Surgical History:  Procedure Laterality Date  . polyps remvoed from nose    . RIGHT HEART CATH N/A 03/13/2020   Procedure: RIGHT HEART CATH;  Surgeon: Nelva Bush, MD;  Location: [place] [facility];  Service: Cardiovascular;  Laterality: N/A;  . UPPER GI ENDOSCOPY     approx 10 years     Social History:   reports that he has never smoked. He has never used  smokeless tobacco. He reports current alcohol use of about 3.0 standard drinks of alcohol per week. He reports current drug use. Frequency: 7.00 times per week. Drug: Marijuana.   Family History:  His family history includes Asthma in his brother; Heart disease in his brother and father; Heart murmur in his mother; Liver cancer in his brother. There is no history of Colon cancer, Esophageal cancer, Rectal cancer,  or Stomach cancer.   Allergies Allergies  Allergen Reactions  . Aspirin Shortness Of Breath and Swelling    REACTION: ASTHMA  . Penicillins Swelling    Makes lips and tongue swell up Has patient had a PCN reaction causing immediate rash, facial/tongue/throat swelling, SOB or lightheadedness with hypotension: yes Has patient had a PCN reaction causing severe rash involving mucus membranes or skin necrosis: no Has patient had a PCN reaction that required hospitalization: no Has patient had a PCN reaction occurring within the last 10 years: no, a little over 10 years If all of the above answers are "NO", then may proceed with Cephalosporin use.      Home Medications  Prior to Admission medications   Medication Sig Start Date End Date Taking? Authorizing Provider  albuterol (PROAIR HFA) 108 (90 Base) MCG/ACT inhaler Inhale 2 puffs into the lungs every 6 (six) hours as needed for wheezing or shortness of breath. 02/25/20  Yes Spero Geralds, MD  albuterol (PROVENTIL) (2.5 MG/3ML) 0.083% nebulizer solution Take 3 mLs (2.5 mg total) by nebulization every 6 (six) hours as needed for wheezing or shortness of breath. 09/15/16  Yes Tanda Rockers, MD  Buprenorphine HCl-Naloxone HCl 8-2 MG FILM Place 1 Film under the tongue every other day.  08/06/18  Yes [provider]  diphenhydrAMINE (BENADRYL) 25 MG tablet Take 25 mg by mouth every 6 (six) hours as needed for itching or allergies.   Yes [provider]  FLUoxetine (PROZAC) 20 MG capsule Take 20 mg by mouth daily.   Yes  [provider]  fluticasone (FLONASE) 50 MCG/ACT nasal spray Place 1 spray into both nostrils daily. 02/25/20  Yes Spero Geralds, MD  fluticasone (FLOVENT HFA) 110 MCG/ACT inhaler Inhale 2 puffs into the lungs at bedtime. 02/25/20  Yes Spero Geralds, MD  Nebulizers (NEBULIZER COMPRESSOR) MISC Use as directed Dx: 01/10/17  Yes Tanda Rockers, MD  ondansetron (ZOFRAN) 4 MG tablet Take 1 tablet (4 mg total) by mouth every 6 (six) hours. Patient taking differently: Take 4 mg by mouth every 8 (eight) hours as needed for nausea. 09/15/16  Yes Tanda Rockers, MD  pantoprazole (PROTONIX) 40 MG tablet TAKE 1 TABLET (40 MG TOTAL) BY MOUTH DAILY. TAKE 30-60 MIN BEFORE FIRST MEAL OF THE DAY Patient taking differently: Take 40 mg by mouth at bedtime. Take 30-60 min before first meal of the day 07/07/17  Yes Tanda Rockers, MD  potassium chloride 20 MEQ/15ML (10%) SOLN Take 15 mLs (20 mEq total) by mouth daily. 02/19/20  Yes Richardson Dopp T, PA-C  Spacer/Aero-Holding Chambers (AEROCHAMBER MV) inhaler Use as instructed 10/28/19  Yes Spero Geralds, MD  spironolactone (ALDACTONE) 100 MG tablet Take 100 mg by mouth daily.  10/17/19  Yes [provider]  torsemide (DEMADEX) 20 MG tablet Take 1 tablet (20 mg total) by mouth daily. 01/17/20  Yes Liliane Shi, PA-C     Critical care time: 40 minutes     Sanjuan Dame, MD Internal Medicine PGY-1 Pager: [phone]

## 2020-06-16 NOTE — Progress Notes (Signed)
Nephrology Follow-Up Consult note   Assessment/Recommendations: Gabriel Ramos is a/an 70 y.o. male with a past medical history significant for hepatitis C, alcohol use disorder, liver disease who present w/ AKI  Non-Oliguric AKI, improving: Likely secondary to dehydration and hypotension. BL normal.   He has some urine output and creatinine continues to improve. Peak Cr 7.2 -Continue fluids at 85 cc/hr, can likely decrease rate or stop if taking in adequate PO, will defer to primary service -Creatinine downtrending -UA and RUS reassuring  - will follow -maintain MAP>65 -Avoid nephrotoxic medications including NSAIDs and iodinated intravenous contrast exposure unless the latter is absolutely indicated.  Preferred narcotic agents for pain control are hydromorphone, fentanyl, and methadone. Morphine should not be used. Avoid Baclofen and avoid oral sodium phosphate and magnesium citrate based laxatives / bowel preps. Continue strict Input and Output monitoring. Will monitor the patient closely with you and intervene or adjust therapy as indicated by changes in clinical status/labs   Shock: Concerning for septic shock with possible cellulitis based on lower extremities. Antibiotics per primary team.  Continue norepinephrine as above, increase midodrine to 10mg  tid   Metabolic acidosis: improved  Hyponatremia: resolved. Na 136 today  hypokalemia: improved, replete PRN  Hepatitis C: Status post treatment based on notes. Wife states this was a recent diagnosis. Will recheck hepatitis C titers    [facility] 06/16/2020 12:51 PM  ___________________________________________________________  Interval History/Subjective: no acute events. On levophed. uop ~0.6L   Medications:  Current Facility-Administered Medications  Medication Dose Route Frequency Provider Last Rate Last Admin  . (feeding supplement) PROSource Plus liquid 30 mL  30 mL Oral BID BM  Hunsucker, Bonna Gains, MD   30 mL at 06/16/20 0919  . 0.9 %  sodium chloride infusion  250 mL Intravenous Continuous Wyvonnia Dusky, MD 10 mL/hr at 06/16/20 0055 250 mL at 06/16/20 0055  . albuterol (VENTOLIN HFA) 108 (90 Base) MCG/ACT inhaler 1-2 puff  1-2 puff Inhalation Q6H PRN Sanjuan Dame, MD      . ceFAZolin (ANCEF) IVPB 2g/100 mL premix  2 g Intravenous Q12H Sanjuan Dame, MD 200 mL/hr at 06/16/20 1156 2 g at 06/16/20 1156  . Chlorhexidine Gluconate Cloth 2 % PADS 6 each  6 each Topical Daily Stretch, Marily Lente, MD   6 each at 06/16/20 0919  . dextrose 5 % in lactated ringers infusion   Intravenous Continuous Roney Jaffe, MD 85 mL/hr at 06/16/20 1156 New Bag at 06/16/20 1156  . diphenhydrAMINE (BENADRYL) capsule 25 mg  25 mg Oral Q6H PRN Hunsucker, Bonna Gains, MD   25 mg at 06/16/20 1241  . docusate sodium (COLACE) capsule 100 mg  100 mg Oral BID PRN Merlene Laughter F, NP      . feeding supplement (ENSURE ENLIVE / ENSURE PLUS) liquid 237 mL  237 mL Oral TID BM Hunsucker, Bonna Gains, MD   237 mL at 06/16/20 0919  . folic acid (FOLVITE) tablet 1 mg  1 mg Oral Daily Sanjuan Dame, MD   1 mg at 06/16/20 0917  . heparin injection 5,000 Units  5,000 Units Subcutaneous Q8H Merlene Laughter F, NP   5,000 Units at 06/16/20 0536  . HYDROmorphone (DILAUDID) injection 0.5 mg  0.5 mg Intravenous Q4H PRN Sanjuan Dame, MD   0.5 mg at 06/16/20 0917  . midodrine (PROAMATINE) tablet 10 mg  10 mg Oral TID WC Sanjuan Dame, MD   10 mg at 06/16/20 1202  . mometasone-formoterol (DULERA) 100-5 MCG/ACT  inhaler 2 puff  2 puff Inhalation BID Rigoberto Noel, MD   2 puff at 06/16/20 0924  . norepinephrine (LEVOPHED) 4mg  in 292mL premix infusion  2-10 mcg/min Intravenous Titrated Wyvonnia Dusky, MD 7.5 mL/hr at 06/16/20 0700 2 mcg/min at 06/16/20 0700  . nutrition supplement (JUVEN) (JUVEN) powder packet 1 packet  1 packet Oral BID BM Hunsucker, Bonna Gains, MD   1 packet at 06/16/20 0915  .  pantoprazole (PROTONIX) injection 40 mg  40 mg Intravenous QHS Merlene Laughter F, NP   40 mg at 06/15/20 2110  . polyethylene glycol (MIRALAX / GLYCOLAX) packet 17 g  17 g Oral Daily PRN Merlene Laughter F, NP      . thiamine tablet 50 mg  50 mg Oral Daily Sanjuan Dame, MD   50 mg at 06/16/20 2257      Review of Systems: 10 systems reviewed and negative except per interval history/subjective  Physical Exam: Vitals:   06/16/20 0747 06/16/20 1124  BP:    Pulse:    Resp:    Temp: 98 F (36.7 C) 98.3 F (36.8 C)  SpO2:     Total I/O In: -  Out: 110 [Urine:110]  Intake/Output Summary (Last 24 hours) at 06/16/2020 1251 Last data filed at 06/16/2020 0900 Gross per 24 hour  Intake 2068.5 ml  Output 714 ml  Net 1354.5 ml   General: Chronically ill appearing, lying in bed HEENT: anicteric sclera, oropharynx clear without lesions CV: tachycardia, no obvious murmur Lungs: Bilateral chest rise, normal work of breathing Abd: soft, non-tender, non-distended, no sig ascites Skin:  Bilateral lower extremity erythema persists, mild edema in legs today, scattered abrasions Psych: alert, engaged, appropriate mood and affect Musculoskeletal: no obvious deformities Neuro: normal speech, no gross focal deficits    Test Results I personally reviewed new and old clinical labs and radiology tests Lab Results  Component Value Date   NA 136 06/16/2020   K 3.3 (L) 06/16/2020   CL 104 06/16/2020   CO2 22 06/16/2020   BUN 43 (H) 06/16/2020   CREATININE 3.30 (H) 06/16/2020   GFR 83.16 12/16/2019   CALCIUM 7.5 (L) 06/16/2020   ALBUMIN 1.7 (L) 06/16/2020   PHOS 1.8 (L) 06/16/2020   PHOS 1.9 (L) 06/16/2020

## 2020-06-16 NOTE — Evaluation (Signed)
Clinical/Bedside Swallow Evaluation Patient Details  Name: Gabriel Ramos MRN: [id number] Date of Birth: 06/11/1950  Today's Date: 06/16/2020 Time: SLP Start Time (ACUTE ONLY): 0930 SLP Stop Time (ACUTE ONLY): 1000 SLP Time Calculation (min) (ACUTE ONLY): 30 min  Past Medical History:  Past Medical History:  Diagnosis Date  . Abnormal CXR 09/16/2016   Report 09/07/16 c/w granuloma  . Alcohol dependence with unspecified alcohol-induced disorder ([place])   . Alcoholic hepatitis without ascites   . ALLERGIC RHINITIS 09/14/2006   Qualifier: Diagnosis of  By: [name]    . Anxiety   . Asthma, chronic, severe persistent, uncomplicated 02/25/1940   Allergy profile 09/15/2016 >  Eos 0.5/  IgE  271 Grass > dog  - 09/15/2016    try symbicort 160 2bid  - 10/17/2016  After extensive coaching HFA effectiveness =    75%  (ti too short)  - sinus CT 03/02/2017 >  Pos polyp    . BPH (benign prostatic hyperplasia)   . Concentric left ventricular hypertrophy   . DEPRESSION 09/14/2006   Qualifier: Diagnosis of  By: [name]    . Echocardiogram    Echocardiogram 9/21: EF 60-65, no RWMA, mild LVH, Gr 1 DD, normal RVSF, RVSP 23.1, trivial MR  . GERD (gastroesophageal reflux disease)   . Gout 03/28/2012  . Hepatitis C   . History of nuclear stress test    Myoview 9/21: EF 84, normal perfusion; low risk  . History of paroxysmal supraventricular tachycardia   . Hypertension   . Hypoalbuminemia   . Hypokalemia   . Nausea   . Opioid abuse ([place])   . Simple chronic bronchitis ([place])   . SVT (supraventricular tachycardia) ([place])    2013,echo [place] NI LV EF ,mild MR: nl stress cl 3/13  . SVT (supraventricular tachycardia) ([place]) 08/12/2018   Cardiac work-up in 2013 normal  . Vertigo    Past Surgical History:  Past Surgical History:  Procedure Laterality Date  . polyps remvoed from nose    . RIGHT HEART CATH N/A 03/13/2020   Procedure: RIGHT HEART CATH;  Surgeon: Nelva Bush, MD;  Location: [place] [facility];  Service: Cardiovascular;  Laterality: N/A;  . 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 Pt reports he has not eaten solid foods in several weeks, because it "won't go down". CN unremarkable except voice quality which is hoarse. Pt accepted trials of thin and nectar thick liquid, which appeared to be tolerated well. No overt s/s aspiration or change in sats noted. Pt refused trials of puree and solid texture, given recent history of difficulty with these textures. Recommend continuing with full liquid diet at this time.   Also recommend consideration of a regular barium swallow to evaluate esophageal motility. SLP will follow up after results are available to provide education.   SLP Visit Diagnosis: Dysphagia, unspecified (R13.10)    Aspiration Risk  Mild aspiration risk;Risk for inadequate nutrition/hydration    Diet Recommendation Other (Comment) (full liquid)   Liquid Administration via: Cup;Straw Medication Administration: Whole meds with liquid Supervision: Patient able to self feed Compensations: Minimize environmental distractions;Slow rate;Small sips/bites Postural Changes: Seated upright at 90 degrees;Remain upright for at  least 30 minutes after po intake    Other  Recommendations Oral Care Recommendations: Oral care BID   Follow up Recommendations Other (comment) (TBD)      Frequency and Duration min 1 x/week  1 week       Prognosis Prognosis for Safe Diet Advancement: Good      Swallow Study   General Date of Onset: 06/22/2020 HPI: 70 year old EtOH, opiate use disorder, 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 admitted with bilateral lower  extremity cellulitis, hypertension, nausea and vomiting for 2 weeks and AKI Type of Study: Bedside Swallow Evaluation Previous Swallow Assessment: none Diet Prior to this Study: Other (Comment) (full liquid) Temperature Spikes Noted: No Respiratory Status: Room air History of Recent Intubation: No Behavior/Cognition: Alert;Cooperative;Pleasant mood Oral Cavity Assessment: Within Functional Limits Oral Care Completed by SLP: Recent completion by staff Vision: Functional for self-feeding Self-Feeding Abilities: Able to feed self Patient Positioning: Upright in bed Baseline Vocal Quality: Hoarse Volitional Cough: Weak Volitional Swallow: Able to elicit    Oral/Motor/Sensory Function Overall Oral Motor/Sensory Function: Within functional limits   Ice Chips Ice chips: Not tested   Thin Liquid Thin Liquid: Within functional limits Presentation: Straw    Nectar Thick Nectar Thick Liquid: Within functional limits Presentation: Straw   Honey Thick Honey Thick Liquid: Not tested   Puree  Pt refused  Solid      Pt refused    Celia B. Quentin Ore, [facility], [place] Speech Language Pathologist Office: [phone] Pager: [phone]  Shonna Chock 06/16/2020,10:02 AM

## 2020-06-16 NOTE — Consult Note (Signed)
Consultation Note Date: 06/16/2020   Patient Name: Gabriel Ramos  DOB: April 28, 1951  MRN: [id number]  Age / Sex: 70 y.o., male  PCP: Hulan Fess, MD Referring Physician: Lanier Clam, MD  Reason for Consultation: Establishing goals of care and Psychosocial/spiritual support  HPI/Patient Profile: 70 y.o. male   admitted on 06/20/2020 with PMH significant for Hepatitis C/EtOH related cirrhosis, and chronic pedal edema,      Cardiology work-up has shown normal LVEF and normal right heart pressures.    Last creatinine 03/2020 was normal.     Today creatine is 3.30, is trending down.   Patient reports opioid use disorder, maintenance  on Suboxone  Noted in medical records EGD in 3 of 2021 revealed Schatzki ring at [place] junction, currently patient reports difficulty swallowing solids.  Per patient reported physical and functional decline over the past few weeks, Increasing bilateral lower extremity edema and nausea with poor p.o. intake.     Patient faces treatment option decisions, advanced directive decisions and anticipatory care needs.   Clinical Assessment and Goals of Care:   This NP Wadie Lessen reviewed medical records, received report from team, assessed the patient and then meet at the patient's bedside  to discuss diagnosis, prognosis, GOC, EOL wishes disposition and options.   Concept of Palliative Care was introduced as specialized medical care for people and their families living with serious illness.  If focuses on providing relief from the symptoms and stress of a serious illness.  The goal is to improve quality of life for both the patient and the family.  Values and goals of care important to patient and family were attempted to be elicited.   Created space and opportunity for patient  to explore thoughts and feelings regarding current medical information Patient verbalizes interest  in continued conversation regarding goals of care and treatment plan with his family.  Spoke/by telephone  to [name] who is his significant other.  She verbalizes an understanding of the seriousness of Mr. [name] medical condition.  She voices concern regarding anticipatory care needs in the home.   A  discussion was had today regarding advanced directives.  Concepts specific to code status, artifical feeding and hydration, continued IV antibiotics and rehospitalization was had.    MOST form introduced   Discussed with patient the importance of continued conversation with his family and the medical providers regarding overall plan of care and treatment options,  ensuring decisions are within the context of the patients values and GOCs.  Meeting is scheduled for tomorrow morning, at 10 AM, at patient's bedside along with his son for continued conversation regarding goals of care and treatment plan.   No documented  HPOA.   Patient's/SO/ [name] is not his legal spouse.   Patient informs me that he would want his son [name] to be his decision-maker in the event that he could not speak for himself.  Encourage patient to secure those documents while here in the hospital with the support of the spiritual care  department.    SUMMARY OF RECOMMENDATIONS    Code Status/Advance Care Planning:  Full code   Palliative Prophylaxis:   Aspiration, Bowel Regimen, Delirium Protocol, Frequent Pain Assessment and Oral Care  Additional Recommendations (Limitations, Scope, Preferences):  Full Scope Treatment-  Patient is hopeful for improvement and return to baseline.  Psycho-social/Spiritual:   Desire for further Chaplaincy support:yes   Prognosis:   Unable to determine  Discharge Planning: Discussed with patient the likelihood that he will need at a minimum short-term rehab prior to returning home.   To Be Determined      Primary Diagnoses: Present on  Admission: **None**   I have reviewed the medical record, interviewed the patient and family, and examined the patient. The following aspects are pertinent.  Past Medical History:  Diagnosis Date  . Abnormal CXR 09/16/2016   Report 09/07/16 c/w granuloma  . Alcohol dependence with unspecified alcohol-induced disorder ([place])   . Alcoholic hepatitis without ascites   . ALLERGIC RHINITIS 09/14/2006   Qualifier: Diagnosis of  By: [name]    . Anxiety   . Asthma, chronic, severe persistent, uncomplicated 0000000   Allergy profile 09/15/2016 >  Eos 0.5/  IgE  271 Grass > dog  - 09/15/2016    try symbicort 160 2bid  - 10/17/2016  After extensive coaching HFA effectiveness =    75%  (ti too short)  - sinus CT 03/02/2017 >  Pos polyp    . BPH (benign prostatic hyperplasia)   . Concentric left ventricular hypertrophy   . DEPRESSION 09/14/2006   Qualifier: Diagnosis of  By: [name]    . Echocardiogram    Echocardiogram 9/21: EF 60-65, no RWMA, mild LVH, Gr 1 DD, normal RVSF, RVSP 23.1, trivial MR  . GERD (gastroesophageal reflux disease)   . Gout 03/28/2012  . Hepatitis C   . History of nuclear stress test    Myoview 9/21: EF 84, normal perfusion; low risk  . History of paroxysmal supraventricular tachycardia   . Hypertension   . Hypoalbuminemia   . Hypokalemia   . Nausea   . Opioid abuse ([place])   . Simple chronic bronchitis ([place])   . SVT (supraventricular tachycardia) ([place])    2013,echo Var[place]asi NI LV EF ,mild MR: nl stress cl 3/13  . SVT (supraventricular tachycardia) ([place]) 08/12/2018   Cardiac work-up in 2013 normal  . Vertigo    Social History   Socioeconomic History  . Marital status: Significant Other    Spouse name: Not on file  . Number of children: 1  . Years of education: Not on file  . Highest education level: Not on file  Occupational History  . Occupation: retired  Tobacco Use  . Smoking status: Never Smoker  . Smokeless tobacco: Never Used  Vaping Use  .  Vaping Use: Never used  Substance and Sexual Activity  . Alcohol use: Yes    Alcohol/week: 3.0 standard drinks    Types: 3 Shots of liquor per week    Comment: smirnoffs, BRANDY BOTTLE EVERY OTHER DAY  . Drug use: Yes    Frequency: 7.0 times per week    Types: Marijuana    Comment: this morning 08/27/19  . Sexual activity: Yes    Birth control/protection: None  Other Topics Concern  . Not on file  Social History Narrative  . Not on file   Social Determinants of Health   Financial Resource Strain: Not on file  Food Insecurity: Not on file  Transportation Needs:  Not on file  Physical Activity: Not on file  Stress: Not on file  Social Connections: Not on file   Family History  Problem Relation Age of Onset  . Heart murmur Mother   . Heart disease Father   . Heart disease Brother   . Asthma Brother   . Liver cancer Brother        Stage 4   . Colon cancer Neg Hx   . Esophageal cancer Neg Hx   . Rectal cancer Neg Hx   . Stomach cancer Neg Hx    Scheduled Meds: . (feeding supplement) PROSource Plus  30 mL Oral BID BM  . Chlorhexidine Gluconate Cloth  6 each Topical Daily  . feeding supplement  237 mL Oral TID BM  . folic acid  1 mg Oral Daily  . heparin  5,000 Units Subcutaneous Q8H  . midodrine  10 mg Oral TID WC  . mometasone-formoterol  2 puff Inhalation BID  . nutrition supplement (JUVEN)  1 packet Oral BID BM  . pantoprazole (PROTONIX) IV  40 mg Intravenous QHS  . thiamine  50 mg Oral Daily   Continuous Infusions: . sodium chloride 250 mL (06/16/20 0055)  .  ceFAZolin (ANCEF) IV Stopped (06/15/20 2151)  . dextrose 5% lactated ringers 85 mL/hr at 06/16/20 0700  . norepinephrine (LEVOPHED) Adult infusion 2 mcg/min (06/16/20 0700)   PRN Meds:.albuterol, docusate sodium, HYDROmorphone (DILAUDID) injection, polyethylene glycol Medications Prior to Admission:  Prior to Admission medications   Medication Sig Start Date End Date Taking? Authorizing Provider   albuterol (PROAIR HFA) 108 (90 Base) MCG/ACT inhaler Inhale 2 puffs into the lungs every 6 (six) hours as needed for wheezing or shortness of breath. 02/25/20  Yes Spero Geralds, MD  albuterol (PROVENTIL) (2.5 MG/3ML) 0.083% nebulizer solution Take 3 mLs (2.5 mg total) by nebulization every 6 (six) hours as needed for wheezing or shortness of breath. 09/15/16  Yes Tanda Rockers, MD  Buprenorphine HCl-Naloxone HCl 8-2 MG FILM Place 1 Film under the tongue every other day.  08/06/18  Yes [provider]  diphenhydrAMINE (BENADRYL) 25 MG tablet Take 25 mg by mouth every 6 (six) hours as needed for itching or allergies.   Yes [provider]  FLUoxetine (PROZAC) 20 MG capsule Take 20 mg by mouth daily.   Yes [provider]  fluticasone (FLONASE) 50 MCG/ACT nasal spray Place 1 spray into both nostrils daily. 02/25/20  Yes Spero Geralds, MD  fluticasone (FLOVENT HFA) 110 MCG/ACT inhaler Inhale 2 puffs into the lungs at bedtime. 02/25/20  Yes Spero Geralds, MD  Nebulizers (NEBULIZER COMPRESSOR) MISC Use as directed Dx: 01/10/17  Yes Tanda Rockers, MD  ondansetron (ZOFRAN) 4 MG tablet Take 1 tablet (4 mg total) by mouth every 6 (six) hours. Patient taking differently: Take 4 mg by mouth every 8 (eight) hours as needed for nausea. 09/15/16  Yes Tanda Rockers, MD  pantoprazole (PROTONIX) 40 MG tablet TAKE 1 TABLET (40 MG TOTAL) BY MOUTH DAILY. TAKE 30-60 MIN BEFORE FIRST MEAL OF THE DAY Patient taking differently: Take 40 mg by mouth at bedtime. Take 30-60 min before first meal of the day 07/07/17  Yes Tanda Rockers, MD  potassium chloride 20 MEQ/15ML (10%) SOLN Take 15 mLs (20 mEq total) by mouth daily. 02/19/20  Yes Richardson Dopp T, PA-C  Spacer/Aero-Holding Chambers (AEROCHAMBER MV) inhaler Use as instructed 10/28/19  Yes Spero Geralds, MD  spironolactone (ALDACTONE) 100 MG tablet Take  100 mg by mouth daily.  10/17/19  Yes [provider]  torsemide (DEMADEX) 20 MG  tablet Take 1 tablet (20 mg total) by mouth daily. 01/17/20  Yes Richardson Dopp T, PA-C   Allergies  Allergen Reactions  . Aspirin Shortness Of Breath and Swelling    REACTION: ASTHMA  . Penicillins Swelling    Makes lips and tongue swell up Has patient had a PCN reaction causing immediate rash, facial/tongue/throat swelling, SOB or lightheadedness with hypotension: yes Has patient had a PCN reaction causing severe rash involving mucus membranes or skin necrosis: no Has patient had a PCN reaction that required hospitalization: no Has patient had a PCN reaction occurring within the last 10 years: no, a little over 10 years If all of the above answers are "NO", then may proceed with Cephalosporin use.    Review of Systems  Cardiovascular: Positive for leg swelling.  Neurological: Positive for weakness.    Physical Exam Constitutional:      Appearance: He is underweight. He is ill-appearing.  Cardiovascular:     Rate and Rhythm: Tachycardia present.  Skin:    General: Skin is warm and dry.  Neurological:     Mental Status: He is alert and oriented to person, place, and time.     Vital Signs: BP 97/63   Pulse (!) 109   Temp 98 F (36.7 C) (Oral)   Resp 19   Ht 5\' 10"  (1.778 m)   Wt 57.2 kg   SpO2 99%   BMI 18.09 kg/m  Pain Scale: 0-10   Pain Score: 0-No pain   SpO2: SpO2: 99 % O2 Device:SpO2: 99 % O2 Flow Rate: .O2 Flow Rate (L/min): 4 L/min  IO: Intake/output summary:   Intake/Output Summary (Last 24 hours) at 06/16/2020 1049 Last data filed at 06/16/2020 0900 Gross per 24 hour  Intake 2083.5 ml  Output 714 ml  Net 1369.5 ml    LBM: Last BM Date: 06/14/20 Baseline Weight: Weight: 61 kg Most recent weight: Weight: 57.2 kg     Palliative Assessment/Data: 30 % at best    Discussed with Dr Collene Gobble  Time In: 0930 Time Out: 1040 Time Total: 70 minutes Greater than 50%  of this time was spent counseling and coordinating care related to the above assessment  and plan.  Signed by: Wadie Lessen, NP   Please contact Palliative Medicine Team phone at [phone] for questions and concerns.  For individual provider: See Shea Evans

## 2020-06-17 ENCOUNTER — Inpatient Hospital Stay (HOSPITAL_COMMUNITY): Payer: Medicare HMO

## 2020-06-17 DIAGNOSIS — B182 Chronic viral hepatitis C: Secondary | ICD-10-CM | POA: Diagnosis not present

## 2020-06-17 DIAGNOSIS — Z515 Encounter for palliative care: Secondary | ICD-10-CM | POA: Diagnosis not present

## 2020-06-17 DIAGNOSIS — Z7189 Other specified counseling: Secondary | ICD-10-CM | POA: Diagnosis not present

## 2020-06-17 DIAGNOSIS — A419 Sepsis, unspecified organism: Secondary | ICD-10-CM | POA: Diagnosis not present

## 2020-06-17 DIAGNOSIS — L03119 Cellulitis of unspecified part of limb: Secondary | ICD-10-CM | POA: Diagnosis not present

## 2020-06-17 DIAGNOSIS — I959 Hypotension, unspecified: Secondary | ICD-10-CM | POA: Diagnosis not present

## 2020-06-17 DIAGNOSIS — E43 Unspecified severe protein-calorie malnutrition: Secondary | ICD-10-CM | POA: Diagnosis not present

## 2020-06-17 DIAGNOSIS — R1310 Dysphagia, unspecified: Secondary | ICD-10-CM | POA: Diagnosis not present

## 2020-06-17 DIAGNOSIS — K7030 Alcoholic cirrhosis of liver without ascites: Secondary | ICD-10-CM

## 2020-06-17 DIAGNOSIS — K224 Dyskinesia of esophagus: Secondary | ICD-10-CM | POA: Diagnosis not present

## 2020-06-17 DIAGNOSIS — R5383 Other fatigue: Secondary | ICD-10-CM | POA: Diagnosis not present

## 2020-06-17 DIAGNOSIS — K7460 Unspecified cirrhosis of liver: Secondary | ICD-10-CM | POA: Diagnosis not present

## 2020-06-17 DIAGNOSIS — E871 Hypo-osmolality and hyponatremia: Secondary | ICD-10-CM | POA: Diagnosis not present

## 2020-06-17 DIAGNOSIS — E872 Acidosis: Secondary | ICD-10-CM | POA: Diagnosis not present

## 2020-06-17 DIAGNOSIS — R6521 Severe sepsis with septic shock: Secondary | ICD-10-CM | POA: Diagnosis not present

## 2020-06-17 DIAGNOSIS — N179 Acute kidney failure, unspecified: Secondary | ICD-10-CM | POA: Diagnosis not present

## 2020-06-17 LAB — CBC
HCT: 27 % — ABNORMAL LOW (ref 39.0–52.0)
Hemoglobin: 9.3 g/dL — ABNORMAL LOW (ref 13.0–17.0)
MCH: 34.3 pg — ABNORMAL HIGH (ref 26.0–34.0)
MCHC: 34.4 g/dL (ref 30.0–36.0)
MCV: 99.6 fL (ref 80.0–100.0)
Platelets: 66 10*3/uL — ABNORMAL LOW (ref 150–400)
RBC: 2.71 MIL/uL — ABNORMAL LOW (ref 4.22–5.81)
RDW: 16.9 % — ABNORMAL HIGH (ref 11.5–15.5)
WBC: 4.9 10*3/uL (ref 4.0–10.5)
nRBC: 0 % (ref 0.0–0.2)

## 2020-06-17 LAB — RENAL FUNCTION PANEL
Albumin: 1.5 g/dL — ABNORMAL LOW (ref 3.5–5.0)
Anion gap: 13 (ref 5–15)
BUN: 42 mg/dL — ABNORMAL HIGH (ref 8–23)
CO2: 19 mmol/L — ABNORMAL LOW (ref 22–32)
Calcium: 7.2 mg/dL — ABNORMAL LOW (ref 8.9–10.3)
Chloride: 105 mmol/L (ref 98–111)
Creatinine, Ser: 2.65 mg/dL — ABNORMAL HIGH (ref 0.61–1.24)
GFR, Estimated: 26 mL/min — ABNORMAL LOW (ref >60)
Glucose, Bld: 81 mg/dL (ref 70–99)
Phosphorus: 1.1 mg/dL — ABNORMAL LOW (ref 2.5–4.6)
Potassium: 3.7 mmol/L (ref 3.5–5.1)
Sodium: 137 mmol/L (ref 135–145)

## 2020-06-17 LAB — PHOSPHORUS
Phosphorus: 2.1 mg/dL — ABNORMAL LOW (ref 2.5–4.6)
Phosphorus: 3.5 mg/dL (ref 2.5–4.6)

## 2020-06-17 LAB — GLUCOSE, CAPILLARY
Glucose-Capillary: 72 mg/dL (ref 70–99)
Glucose-Capillary: 66 mg/dL — ABNORMAL LOW (ref 70–99)
Glucose-Capillary: 137 mg/dL — ABNORMAL HIGH (ref 70–99)
Glucose-Capillary: 68 mg/dL — ABNORMAL LOW (ref 70–99)
Glucose-Capillary: 166 mg/dL — ABNORMAL HIGH (ref 70–99)
Glucose-Capillary: 53 mg/dL — ABNORMAL LOW (ref 70–99)
Glucose-Capillary: 148 mg/dL — ABNORMAL HIGH (ref 70–99)
Glucose-Capillary: 127 mg/dL — ABNORMAL HIGH (ref 70–99)
Glucose-Capillary: 155 mg/dL — ABNORMAL HIGH (ref 70–99)

## 2020-06-17 LAB — MAGNESIUM
Magnesium: 1.4 mg/dL — ABNORMAL LOW (ref 1.7–2.4)
Magnesium: 2.8 mg/dL — ABNORMAL HIGH (ref 1.7–2.4)
Magnesium: 2.7 mg/dL — ABNORMAL HIGH (ref 1.7–2.4)

## 2020-06-17 MED ORDER — DEXTROSE 50 % IV SOLN
INTRAVENOUS | Status: AC
  Administered 2020-06-17: 50 mL
  Filled 2020-06-17: qty 50

## 2020-06-17 MED ORDER — ONDANSETRON HCL 4 MG/2ML IJ SOLN
4.0000 mg | Freq: Three times a day (TID) | INTRAMUSCULAR | Status: DC | PRN
  Administered 2020-06-17 – 2020-06-18 (×3): 4 mg via INTRAVENOUS
  Filled 2020-06-17 (×3): qty 2

## 2020-06-17 MED ORDER — SODIUM CHLORIDE 0.9 % IV SOLN
250.0000 mL | INTRAVENOUS | Status: DC
  Administered 2020-06-19: 250 mL via INTRAVENOUS

## 2020-06-17 MED ORDER — OSMOLITE 1.5 CAL PO LIQD
1000.0000 mL | ORAL | Status: DC
  Administered 2020-06-17 – 2020-06-19 (×3): 1000 mL
  Filled 2020-06-17 (×7): qty 1000

## 2020-06-17 MED ORDER — SODIUM CHLORIDE 0.9 % IV SOLN: 250.0000 mL | INTRAVENOUS | Status: DC

## 2020-06-17 MED ORDER — PHENYLEPHRINE HCL-NACL 10-0.9 MG/250ML-% IV SOLN: 25.0000 ug/min | INTRAVENOUS | Status: DC

## 2020-06-17 MED ORDER — PANTOPRAZOLE SODIUM 40 MG PO PACK
40.0000 mg | PACK | Freq: Every day | ORAL | Status: DC
  Administered 2020-06-17 – 2020-06-20 (×4): 40 mg
  Filled 2020-06-17 (×5): qty 20

## 2020-06-17 MED ORDER — DEXTROSE 50 % IV SOLN
INTRAVENOUS | Status: AC
  Filled 2020-06-17: qty 50

## 2020-06-17 MED ORDER — SODIUM PHOSPHATES 45 MMOLE/15ML IV SOLN
30.0000 mmol | Freq: Once | INTRAVENOUS | Status: AC
  Administered 2020-06-17: 30 mmol via INTRAVENOUS
  Filled 2020-06-17: qty 10

## 2020-06-17 MED ORDER — MAGNESIUM SULFATE 2 GM/50ML IV SOLN
2.0000 g | Freq: Once | INTRAVENOUS | Status: AC
  Administered 2020-06-17: 2 g via INTRAVENOUS
  Filled 2020-06-17: qty 50

## 2020-06-17 MED ORDER — FOLIC ACID 1 MG PO TABS
1.0000 mg | ORAL_TABLET | Freq: Every day | ORAL | Status: DC
  Administered 2020-06-18 – 2020-06-20 (×3): 1 mg
  Filled 2020-06-17 (×3): qty 1

## 2020-06-17 MED ORDER — JUVEN PO PACK
1.0000 | PACK | Freq: Two times a day (BID) | ORAL | Status: DC
  Administered 2020-06-18 – 2020-06-21 (×8): 1
  Filled 2020-06-17 (×8): qty 1

## 2020-06-17 MED ORDER — NOREPINEPHRINE 4 MG/250ML-% IV SOLN
2.0000 ug/min | INTRAVENOUS | Status: AC
  Administered 2020-06-17: 5 ug/min via INTRAVENOUS
  Administered 2020-06-17: 3 ug/min via INTRAVENOUS
  Filled 2020-06-17 (×2): qty 250

## 2020-06-17 MED ORDER — PROSOURCE TF PO LIQD: 45.0000 mL | Freq: Two times a day (BID) | ORAL | Status: DC

## 2020-06-17 MED ORDER — DIPHENHYDRAMINE HCL 25 MG PO CAPS
25.0000 mg | ORAL_CAPSULE | Freq: Four times a day (QID) | ORAL | Status: DC | PRN
  Administered 2020-06-17 – 2020-06-20 (×2): 25 mg
  Filled 2020-06-17 (×2): qty 1

## 2020-06-17 MED ORDER — MIDODRINE HCL 5 MG PO TABS
10.0000 mg | ORAL_TABLET | Freq: Three times a day (TID) | ORAL | Status: DC
  Administered 2020-06-17 – 2020-06-20 (×10): 10 mg
  Filled 2020-06-17 (×9): qty 2

## 2020-06-17 MED ORDER — DOCUSATE SODIUM 50 MG/5ML PO LIQD: 100.0000 mg | Freq: Every day | ORAL | Status: DC | PRN

## 2020-06-17 MED ORDER — VITAL HIGH PROTEIN PO LIQD: 1000.0000 mL | ORAL | Status: DC

## 2020-06-17 MED ORDER — THIAMINE HCL 100 MG PO TABS
50.0000 mg | ORAL_TABLET | Freq: Every day | ORAL | Status: DC
  Administered 2020-06-18 – 2020-06-20 (×3): 50 mg
  Filled 2020-06-17 (×3): qty 1

## 2020-06-17 MED ORDER — PROSOURCE TF PO LIQD
45.0000 mL | Freq: Three times a day (TID) | ORAL | Status: DC
  Administered 2020-06-17 – 2020-06-21 (×13): 45 mL
  Filled 2020-06-17 (×13): qty 45

## 2020-06-17 MED ORDER — POLYETHYLENE GLYCOL 3350 17 G PO PACK: 17.0000 g | PACK | Freq: Every day | ORAL | Status: DC | PRN

## 2020-06-17 NOTE — Progress Notes (Signed)
This chaplain responded to PMT consult for notarizing the Pt. Advance Directive:  HCPOA. The paperwork is on the Pt. bedside table.  The Pt. is awake and confirms his interest in giving,  the Pt. son-Joshua, the role of healthcare agent. The chaplain informed the Pt. the notary will be available on Friday for completion of the Advance Directive.  The Pt. declined F/U spiritual care until Friday.

## 2020-06-17 NOTE — Progress Notes (Addendum)
Nutrition Follow-up / Consult  DOCUMENTATION CODES:   Severe malnutrition in context of social or environmental circumstances  INTERVENTION:   Initiate tube feeding via Cortrak: Osmolite 1.5 at 20 ml/h, increase by 10 ml every 8 hours to goal rate of 60 ml/h (1440 ml per day) Prosource TF 45 ml TID  Provides 2280 kcal, 123 gm protein, 1097 ml free water daily  Monitor magnesium, potassium, and phosphorus daily for at least 3 days, MD to replete as needed, as pt is at risk for refeeding syndrome given severe malnutrition, EtOH abuse.  D/C Ensure Enlive PO  Continue Juven BID (change to via tube), each packet provides 80 calories, 8 grams of carbohydrate, 2.5  grams of protein (collagen), 7 grams of L-arginine and 7 grams of L-glutamine; supplement contains CaHMB, Vitamins C, E, B12 and Zinc to promote wound healing   NUTRITION DIAGNOSIS:   Severe Malnutrition related to social / environmental circumstances (EtOH abuse, poor social support) as evidenced by severe muscle depletion,severe fat depletion.  Ongoing  GOAL:   Patient will meet greater than or equal to 90% of their needs   Progressing  MONITOR:   PO intake,Supplement acceptance,Labs,TF tolerance,Skin  REASON FOR ASSESSMENT:   Consult Enteral/tube feeding initiation and management  ASSESSMENT:   70 year old male who presented to the ED on 1/07with lethargy. PMH of EtOH abuse, opiate use disorder, hepatitis C with cirrhosis, chronic pedal edema, HTN. Pt admitted with AKI, septic shock.   Patient remains on a full liquid diet.  Supplements: Ensure Enlive/Plus po TID, Magic cup TID with meals, Juven PO BID. Intake is minimal. Patient has a stricture at the GE junction causing difficulty swallowing.  Cortrak tube has been placed, tip is gastric. Received MD Consult for TF initiation and management.  Palliative Care team is following. Family meeting today.   Medications reviewed and include: folic acid, IV  protonix, thiamine, IV abx, Levophed, IV sodium phosphate,  IV mag sulfate. IVF: D5 LR @ 85 ml/hr  Labs reviewed: phos 1.1, mag 1.4, BUN 42, creatinine 2.65 CBG: 72-66-137-68-53-166  UOP: 635 ml x 24 hours I/O's: +5.8 L since admit Weight up to 61 kg today, likely related to positive volume status.  Diet Order:   Diet Order            Diet full liquid Room service appropriate? Yes; Fluid consistency: Thin  Diet effective now                 EDUCATION NEEDS:   Education needs have been addressed  Skin:  Skin Assessment: Skin Integrity Issues: Stage II: left buttock, sacrum, coccyx, right foot x 2 Stage III: right foot Unstageable: left heel, right knee, left knee Other: non-pressure wounds to left 2nd toe, left 4th toe, left pretibial, right foot x 2, right great toe  Last BM:  1/9  Height:   Ht Readings from Last 1 Encounters:  06/14/20 5\' 10"  (1.778 m)    Weight:   Wt Readings from Last 1 Encounters:  06/17/20 61 kg    BMI:  Body mass index is 19.3 kg/m.  Estimated Nutritional Needs:   Kcal:  2100-2300  Protein:  110-130 grams  Fluid:  1.8 L/day    Lucas Mallow, RD, LDN, CNSC Please refer to Amion for contact information.

## 2020-06-17 NOTE — Procedures (Signed)
Cortrak  Person Inserting Tube:  Latecia Miler E, RD Tube Type:  Cortrak - 43 inches Tube Location:  Left nare Initial Placement:  Stomach Secured by: Bridle Technique Used to Measure Tube Placement:  Documented cm marking at nare/ corner of mouth Cortrak Secured At:  70 cm   Cortrak Tube Team Note:  Consult received to place a Cortrak feeding tube.   No x-ray is required. RN may begin using tube.   If the tube becomes dislodged please keep the tube and contact the Cortrak team at [email or web address] ([email or web address]) for replacement.  If after hours and replacement cannot be delayed, place a NG tube and confirm placement with an abdominal x-ray.    Gabriel Holzmann, MS, RD, LDN RD pager number and weekend/on-call pager number located in Amion.    

## 2020-06-17 NOTE — Progress Notes (Signed)
[facility] Progress Note Patient Name: JASN XIA DOB: 1950-06-16 MRN: [id number]   Date of Service  06/17/2020  HPI/Events of Note  Norepinephrine IV infusion order expired. Norepinephrine still infusing via PIV.   eICU Interventions  Plan: 1. Norepinephrine IV infusion via POV. Titrate to MAP >= 65.      Intervention Category Major Interventions: Hypotension - evaluation and management  Lysle Dingwall 06/17/2020, 6:57 AM

## 2020-06-17 NOTE — Progress Notes (Signed)
Hypoglycemic Event  CBG: 0724-66, 1146-68 1235-52  Treatment: D50 50 mL (25 gm)  Symptoms: None  Follow-up CBG: Time:1306 and 0759 CBG Result: 166 and 137  Possible Reasons for Event: Inadequate meal intake  Comments/MD notified:Dr. Hunsucker aware.  Small bore feeding tube and tube feeds ordered.      Irven [place]

## 2020-06-17 NOTE — Progress Notes (Signed)
Patient ID: Gabriel Ramos, male   DOB: [birth year], 70 y.o.   MRN: [id number]  This NP visited patient at the bedside as a follow up to  yesterday's [place].  Met as scheduled with patient and Gabriel Ramos at the bedside.   Overview of medical situation offered, questions and concerns were addressed.   Education offered regarding treatment option decisions, advanced directive decisions, and anticipatory care needs.  Created space and opportunity for patient and Gabriel Ramos to explore their thoughts and feelings regarding current medical situation.  Both verbalized an sense of knowing the seriousness of Gabriel current medical situation.  We discussed best case scenario versus worst-case scenario.  Patient is high risk for decompensation 2/2 to multiple comorbidites  Ultimately both patient and Gabriel Ramos are open to all offered and available medical interventions to prolong life they are hopeful for improvement.  Patient's Ramos tells me that he is prepared to do "whatever it takes" to help Gabriel father now and when patient discharges from the hospital.    At this time there is no documented healthcare power of attorney or advanced care planning documents.  Patient verbalized that in the event that he could not speak for himself he would want Gabriel Ramos Gabriel Ramos to be Gabriel healthcare agent. Blue book left at bedside and spiritual care will help secure and notarized those documents.  Education offered on the importance of advance care planning specific to [name].   At this time patient wishes to remain a full code Encouraged patient to consider DNR/DNI status understanding evidenced based poor outcomes in similar hospitalized patient, as the cause of arrest is likely associated with advanced chronic illness rather than an easily reversible acute cardio-pulmonary event.  Discussed with patient and Gabriel Ramos the importance of continued conversation with each other  and the medical providers regarding overall plan  of care and treatment options,  ensuring decisions are within the context of the patients values and GOCs.   Discussed with Dr Silas Flood  PMT will continue to support holistically   Total time spent on the unit was 60 minutes  Greater than 50% of the time was spent in counseling and coordination of care  Wadie Lessen NP  Palliative Medicine Team Team Phone # [phone] Pager [phone]

## 2020-06-17 NOTE — Progress Notes (Signed)
[facility] Progress Note Patient Name: Gabriel Ramos DOB: [birth year] MRN: [id number]   Date of Service  06/17/2020  HPI/Events of Note  Hypomagnesemia - Mg++ = 1.4 and Creatinine = 2.64.   eICU Interventions  Will replace Mg++.     Intervention Category Major Interventions: Electrolyte abnormality - evaluation and management  Gabriel Ramos 06/17/2020, 6:25 AM

## 2020-06-17 NOTE — Progress Notes (Signed)
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  I have reviewed prior notes from infectious disease, GI, cardiology.  Cardiology work-up has shown normal LVEF and normal right heart pressures.  He is maintained on torsemide and Aldactone.  Last creatinine 03/2020 was normal. For the last 2 weeks he has developed nausea and vomiting and was unable to take much orally.  He reports drinking Smirnoff lites but for the past month has changed to a shot of brandy every morning to prevent shakes.  For the past few days he was unable to take this.  Developed dyspnea which brought him to the emergency room found to have AKI and hypotension, blood pressure improved with 3 L of fluids and has not required pressors so far. Received empiric antibiotics for lactate of 5.8 with aztreonam/vancomycin/Flagyl. PCCM consulted for further management  Past Medical History:  Hep C/EtOH cirrhosis -no varices on last EGD, no overt hepatic encephalopathy Chronic hypoalbuminemia with pedal edema Normal LV function, normal right heart pressures EtOH use disorder Opiate use disorder, on Suboxone Asthma  Significant Hospital Events:  1/8 > Admission 1/12 low dose NE, UOP stable, Cr continues to improve  Consults:  Nephrology  Procedures:  n/a  Significant Diagnostic Tests:  CT head 1/7 >> No acute intracranial abnormality. Generalized atrophy and chronic small vessel ischemia. Chronic paranasal sinus disease. Bubbly debris within  the right greater than left maxillary sinus may be acute on chronic sinus disease.  [place] abd 1/8 >> No acute findings. No gallstones. No evidence of acute cholecystitis. No ascites. Fatty infiltration of the liver.   Renal US 1/8 >> Technically challenging exam given the presence of extensive support devices and patient's or clinical status. Likely increased bilateral renal cortical echogenicity compatible with medical renal disease. Minimally complex cyst in the upper pole right kidney measuring 1.4cm, not significantly changed from comparison Imaging in 2018. No dedicated follow-up imaging is typically warranted. Additional simple appearing cyst is seen in the upper pole left kidney as well  Micro Data:  BCx 1/8 >> NGTD UCx 1/8 >> NGTD   Antimicrobials:  Aztreonam 1/8 >>1/10 Vancomycin 1/8>>1/10 Flagyl 1/8 Cefazolin 1/11>>  Interim History / Subjective:  No acute events overnight. Remains on low dose NE. Cr better. Swallow study done, stricture at GE junction?  Objective   Blood pressure 102/63, pulse (!) 112, temperature 97.7 F (36.5 C), temperature source Oral, resp. rate 18, height '5\' 10"'  (1.778 m), weight 61 kg, SpO2 95 %.        Intake/Output Summary (Last 24 hours) at 06/17/2020 0847 Last data filed at 06/17/2020 0700 Gross per 24 hour  Intake 1431.76 ml  Output 635 ml  Net 796.76 ml   Filed Weights   06/15/20 0500 06/16/20 0500 06/17/20 0500  Weight: 59.1 kg 57.2 kg 61 kg    Examination: General: Chronically ill-appearing, no acute distress HENT: Normocephalic, atraumatic. Lungs: Normal WOB. On RA. Cardiovascular: Regular rate, rhythm. No m/r/g Abdomen: Soft, non-tender. No ascites appreciated Extremities: BLE erythema improved from yesterday, continues to be tender.  Bandages over wounds in place. Neuro: Awake, alert, oriented x4. GU: No Foley  Resolved Hospital Problem list   Anion gap metabolic acidosis  Assessment & Plan:  Gabriel Ramos is 70yo male w/  alcohol-related fatty liver disease, alcohol and opiate use disorder admitted 1/8 w/ septic shock from bilateral lower extremity cellulitis w/ AKI.  #Septic shock 2/2 BLE cellulitis Patient w/ minimal pressor support, on 2 Levo. BP difficult to control given acute infection in setting of chronic liver disease. Will increase midodrine today in hopes to d/c levophed. Checking inflammatory markers, can discuss MRI for possible osteo. - C/w cefazolin (day 5) - Midodrine 63m TID - ESR normal, CRP mildly elevated - MAP goal >65  #Acute kidney injury Nephrology following, appreciate recs. Renal function improving. UOP adequate.  - Daily RFP - Strict I&O's - Avoid nephrotoxic medications  #EtOH fatty liver disease #Chronic pedal edema Patient has difficult social situation and home and appears to have poor insight into his chronic liver disease. He is open to discussion with Palliative. EGD in March 2021, follows with gastroenterology.  - C/w thiamine, folate qd - Zofran PRN - Palliative consult  #Dysphagia Patient mentions he has had trouble swallowing in the past, says he was told he has a "ring in his throat" that makes swallowing difficult. 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 At bedside yesterday patient drinking Ensure without difficulties. Will have speech evaluate patient. - SLP eval, barium swallow - Cortrak  #Asthma Continue w/ nebs PRN. - Duonebs q4 PRN  #Pain/chronic opiate use disorder on Suboxone Patient has not requested Fentanyl over the last few days. Will switch to dilaudid PRN. - IV dilaudid [id number] PRN  #Hypophosphatemia #Hypokalemia Repleting this AM. Mg normal. Possibly component of refeeding syndrome as he was able to start eating some pudding, Ensure yesterday. Will f/u labs tomorrow. - Mg, Phos, BMP in AM  Best practice (evaluated daily)  Diet: Clear liquids Pain/Anxiety/Delirium protocol (if  indicated): Dilaudid PRN VAP protocol (if indicated): N/A DVT prophylaxis: SubQ heparin GI prophylaxis: N/A Glucose control: N/A Mobility: Bedrest Disposition: ICU  Goals of Care:  Last date of multidisciplinary goals of care discussion: Palliative to continue see today, 1/12 Family and staff present: NA Summary of discussion: NA Follow up goals of care discussion due: NA Code Status: Full  Labs   CBC: Recent Labs  Lab 06/13/20 0141 06/13/20 0154 06/13/20 1128 06/14/20 0128 06/16/20 0216 06/17/20 0151  WBC 8.0  --  6.9 7.8 5.1 4.9  NEUTROABS 7.4  --   --   --   --   --   HGB 10.3* 10.5* 8.6* 8.3* 7.9* 9.3*  HCT 30.9* 31.0* 23.8* 22.6* 23.1* 27.0*  MCV 101.0*  --  96.7 97.4 98.7 99.6  PLT 237  --  174 136* 77* 66*    Basic Metabolic Panel: Recent Labs  Lab 06/13/20 0141 06/13/20 0154 06/14/20 0128 06/15/20 0129 06/15/20 0742 06/15/20 1300 06/16/20 0216 06/17/20 0151  NA 131*   < > 134* 135  --  133* 136 137  K 4.6   < > 3.3* 2.5* 3.1* 3.5 3.3* 3.7  CL 96*   < > 99 102  --  100 104 105  CO2 13*   < > 20* 20*  --  20* 22 19*  GLUCOSE 125*   < > 124* 84  --  104* 99 81  BUN 67*   < > 61* 52*  --  49* 43* 42*  CREATININE 7.20*   < > 5.30* 4.22*  --  3.72* 3.30* 2.65*  CALCIUM 7.8*   < > 6.9* 7.4*  --  7.4* 7.5* 7.2*  MG 1.6*  --  1.6*  --   --  2.0 1.7 1.4*  PHOS  --   --  4.7* 3.4  --   --  1.9*  1.8* 1.1*   < > = values in this interval not displayed.   GFR: Estimated Creatinine Clearance: 23.3 mL/min (A) (by C-G formula based on SCr of 2.65 mg/dL (H)). Recent Labs  Lab 06/13/20 0141 06/13/20 0425 06/13/20 0840 06/13/20 1128 06/14/20 0128 06/16/20 0216 06/17/20 0151  WBC 8.0  --   --  6.9 7.8 5.1 4.9  LATICACIDVEN 5.8* 4.1* 2.8*  --   --   --   --     Liver Function Tests: Recent Labs  Lab 06/13/20 0141 06/14/20 0128 06/15/20 0129 06/16/20 0216 06/17/20 0151  AST 35  --   --   --   --   ALT 20  --   --   --   --   ALKPHOS 127*  --   --    --   --   BILITOT 2.0*  --   --   --   --   PROT 4.6*  --   --   --   --   ALBUMIN 1.7* 1.5* 2.0* 1.7* 1.5*   Recent Labs  Lab 06/13/20 1128  LIPASE 16   Recent Labs  Lab 06/13/20 0141  AMMONIA 48*    ABG    Component Value Date/Time   HCO3 12.1 (L) 06/13/2020 0154   TCO2 13 (L) 06/13/2020 0154   ACIDBASEDEF 12.0 (H) 06/13/2020 0154   O2SAT 83.0 06/13/2020 0154     Coagulation Profile: No results for input(s): INR, PROTIME in the last 168 hours.  Cardiac Enzymes: Recent Labs  Lab 06/13/20 1128  CKTOTAL 208    HbA1C: No results found for: HGBA1C  CBG: Recent Labs  Lab 06/16/20 1932 06/16/20 2322 06/17/20 0320 06/17/20 0724 06/17/20 0759  GLUCAP 90 75 72 66* 137*    Review of Systems:   Pain in lower extremities No nausea/vomiting/chest pain/fevers overnight.  Past Medical History:  He,  has a past medical history of Abnormal CXR (09/16/2016), Alcohol dependence with unspecified alcohol-induced disorder ([place]), Alcoholic hepatitis without ascites, ALLERGIC RHINITIS (09/14/2006), Anxiety, Asthma, chronic, severe persistent, uncomplicated (3/82/5053), BPH (benign prostatic hyperplasia), Concentric left ventricular hypertrophy, DEPRESSION (09/14/2006), Echocardiogram, GERD (gastroesophageal reflux disease), Gout (03/28/2012), Hepatitis C, History of nuclear stress test, History of paroxysmal supraventricular tachycardia, Hypertension, Hypoalbuminemia, Hypokalemia, Nausea, Opioid abuse ([place]), Simple chronic bronchitis ([place]), SVT (supraventricular tachycardia) ([place]), SVT (supraventricular tachycardia) ([place]) (08/12/2018), and Vertigo.   Surgical History:   Past Surgical History:  Procedure Laterality Date   polyps remvoed from nose     RIGHT HEART CATH N/A 03/13/2020   Procedure: RIGHT HEART CATH;  Surgeon: Nelva Bush, MD;  Location: [facility];  Service: Cardiovascular;  Laterality: N/A;   UPPER GI ENDOSCOPY     approx 10 years     Social History:    reports that he has never smoked. He has never used smokeless tobacco. He reports current alcohol use of about 3.0 standard drinks of alcohol per week. He reports current drug use. Frequency: 7.00 times per week. Drug: Marijuana.   Family History:  His family history includes Asthma in his brother; Heart disease in  his brother and father; Heart murmur in his mother; Liver cancer in his brother. There is no history of Colon cancer, Esophageal cancer, Rectal cancer, or Stomach cancer.   Allergies Allergies  Allergen Reactions   Aspirin Shortness Of Breath and Swelling    REACTION: ASTHMA   Penicillins Swelling    Makes lips and tongue swell up Has patient had a PCN reaction causing immediate rash, facial/tongue/throat swelling, SOB or lightheadedness with hypotension: yes Has patient had a PCN reaction causing severe rash involving mucus membranes or skin necrosis: no Has patient had a PCN reaction that required hospitalization: no Has patient had a PCN reaction occurring within the last 10 years: no, a little over 10 years If all of the above answers are "NO", then may proceed with Cephalosporin use.      Home Medications  Prior to Admission medications   Medication Sig Start Date End Date Taking? Authorizing Provider  albuterol (PROAIR HFA) 108 (90 Base) MCG/ACT inhaler Inhale 2 puffs into the lungs every 6 (six) hours as needed for wheezing or shortness of breath. 02/25/20  Yes Spero Geralds, MD  albuterol (PROVENTIL) (2.5 MG/3ML) 0.083% nebulizer solution Take 3 mLs (2.5 mg total) by nebulization every 6 (six) hours as needed for wheezing or shortness of breath. 09/15/16  Yes Tanda Rockers, MD  Buprenorphine HCl-Naloxone HCl 8-2 MG FILM Place 1 Film under the tongue every other day.  08/06/18  Yes [provider]  diphenhydrAMINE (BENADRYL) 25 MG tablet Take 25 mg by mouth every 6 (six) hours as needed for itching or allergies.   Yes [provider]  FLUoxetine (PROZAC)  20 MG capsule Take 20 mg by mouth daily.   Yes [provider]  fluticasone (FLONASE) 50 MCG/ACT nasal spray Place 1 spray into both nostrils daily. 02/25/20  Yes Spero Geralds, MD  fluticasone (FLOVENT HFA) 110 MCG/ACT inhaler Inhale 2 puffs into the lungs at bedtime. 02/25/20  Yes Spero Geralds, MD  Nebulizers (NEBULIZER COMPRESSOR) MISC Use as directed Dx: 01/10/17  Yes Tanda Rockers, MD  ondansetron (ZOFRAN) 4 MG tablet Take 1 tablet (4 mg total) by mouth every 6 (six) hours. Patient taking differently: Take 4 mg by mouth every 8 (eight) hours as needed for nausea. 09/15/16  Yes Tanda Rockers, MD  pantoprazole (PROTONIX) 40 MG tablet TAKE 1 TABLET (40 MG TOTAL) BY MOUTH DAILY. TAKE 30-60 MIN BEFORE FIRST MEAL OF THE DAY Patient taking differently: Take 40 mg by mouth at bedtime. Take 30-60 min before first meal of the day 07/07/17  Yes Tanda Rockers, MD  potassium chloride 20 MEQ/15ML (10%) SOLN Take 15 mLs (20 mEq total) by mouth daily. 02/19/20  Yes Richardson Dopp T, PA-C  Spacer/Aero-Holding Chambers (AEROCHAMBER MV) inhaler Use as instructed 10/28/19  Yes Spero Geralds, MD  spironolactone (ALDACTONE) 100 MG tablet Take 100 mg by mouth daily.  10/17/19  Yes [provider]  torsemide (DEMADEX) 20 MG tablet Take 1 tablet (20 mg total) by mouth daily. 01/17/20  Yes Liliane Shi, PA-C     Critical care time:     CRITICAL CARE Performed by: Lanier Clam   Total critical care time: 31 minutes  Critical care time was exclusive of separately billable procedures and treating other patients.  Critical care was necessary to treat or prevent imminent or life-threatening deterioration.  Critical care was time spent personally by me on the following activities: development of treatment plan with patient and/or  surrogate as well as nursing, discussions with consultants, evaluation of patient's response to treatment, examination of patient, obtaining history from  patient or surrogate, ordering and performing treatments and interventions, ordering and review of laboratory studies, ordering and review of radiographic studies, pulse oximetry and re-evaluation of patient's condition.

## 2020-06-17 NOTE — Progress Notes (Signed)
[facility] Progress Note Patient Name: NAYEF COLLEGE DOB: 01/21/1951 MRN: [id number]   Date of Service  06/17/2020  HPI/Events of Note  N/V - QTc interval = 0.436 seconds.   eICU Interventions  Plan: 1. Zofran 4 mg IV Q 8 hours PRN N/V.     Intervention Category Major Interventions: Other:  Lysle Dingwall 06/17/2020, 10:03 PM

## 2020-06-17 NOTE — Progress Notes (Signed)
Nephrology Follow-Up Consult note   Assessment/Recommendations: Gabriel Ramos is a/an 70 y.o. male with a past medical history significant for hepatitis C, alcohol use disorder, liver disease who present w/ AKI  Non-Oliguric AKI, improving: Likely secondary to dehydration and hypotension. BL normal.   He has some urine output and creatinine continues to improve. Peak Cr 7.2, now 2.7 -UA and RUS reassuring  - will follow -maintain MAP>65 -Avoid nephrotoxic medications including NSAIDs and iodinated intravenous contrast exposure unless the latter is absolutely indicated.  Preferred narcotic agents for pain control are hydromorphone, fentanyl, and methadone. Morphine should not be used. Avoid Baclofen and avoid oral sodium phosphate and magnesium citrate based laxatives / bowel preps. Continue strict Input and Output monitoring. Will monitor the patient closely with you and intervene or adjust therapy as indicated by changes in clinical status/labs   Shock: septic shock with possible cellulitis based on lower extremities. Antibiotics per primary team.  Continue norepinephrine as above, midodrine inc'd to 10mg  tid on 1/61   Metabolic acidosis: improved  Hyponatremia: resolved. Na 136 today  hypokalemia: improved, replete PRN  Cirrhosis, hep C/etoh: palliative on board. Liver transplant candidate? Family meeting today   Recommendations conveyed to primary service. Will sign off from a nephrology perspective for now. Please call with any questions/concerns. Thank you.   [facility] 06/17/2020 12:40 PM  ___________________________________________________________  Interval History/Subjective: no acute events.  uop ~0.6L   Medications:  Current Facility-Administered Medications  Medication Dose Route Frequency Provider Last Rate Last Admin  . (feeding supplement) PROSource Plus liquid 30 mL  30 mL Oral BID BM Hunsucker, Bonna Gains, MD   30 mL at  06/16/20 1443  . 0.9 %  sodium chloride infusion  250 mL Intravenous Continuous Sanjuan Dame, MD 10 mL/hr at 06/16/20 0055 250 mL at 06/16/20 0055  . 0.9 %  sodium chloride infusion  250 mL Intravenous Continuous Anders Simmonds, MD      . 0.9 %  sodium chloride infusion  250 mL Intravenous Continuous Anders Simmonds, MD      . albuterol (VENTOLIN HFA) 108 (90 Base) MCG/ACT inhaler 1-2 puff  1-2 puff Inhalation Q6H PRN Sanjuan Dame, MD   2 puff at 06/17/20 1203  . ceFAZolin (ANCEF) IVPB 2g/100 mL premix  2 g Intravenous Q12H Sanjuan Dame, MD   Stopped at 06/16/20 2200  . Chlorhexidine Gluconate Cloth 2 % PADS 6 each  6 each Topical Daily Sanjuan Dame, MD   6 each at 06/16/20 0919  . dextrose 5 % in lactated ringers infusion   Intravenous Continuous Sanjuan Dame, MD 85 mL/hr at 06/17/20 1215 New Bag at 06/17/20 1215  . dextrose 50 % solution           . diphenhydrAMINE (BENADRYL) capsule 25 mg  25 mg Oral Q6H PRN Hunsucker, Bonna Gains, MD   25 mg at 06/17/20 0034  . docusate sodium (COLACE) capsule 100 mg  100 mg Oral BID PRN Sanjuan Dame, MD      . feeding supplement (ENSURE ENLIVE / ENSURE PLUS) liquid 237 mL  237 mL Oral TID BM Hunsucker, Bonna Gains, MD   237 mL at 06/16/20 2051  . folic acid (FOLVITE) tablet 1 mg  1 mg Oral Daily Sanjuan Dame, MD   1 mg at 06/16/20 0917  . heparin injection 5,000 Units  5,000 Units Subcutaneous Q8H Sanjuan Dame, MD   5,000 Units at 06/17/20 0509  . HYDROmorphone (DILAUDID) injection 0.5 mg  0.5 mg Intravenous Q4H PRN Sanjuan Dame, MD   0.5 mg at 06/17/20 0411  . magnesium sulfate IVPB 2 g 50 mL  2 g Intravenous Once Hunsucker, Bonna Gains, MD 50 mL/hr at 06/17/20 1216 2 g at 06/17/20 1216  . MEDLINE mouth rinse  15 mL Mouth Rinse BID Hunsucker, Bonna Gains, MD   15 mL at 06/17/20 1216  . midodrine (PROAMATINE) tablet 10 mg  10 mg Oral TID WC Sanjuan Dame, MD   10 mg at 06/16/20 1810  . mometasone-formoterol (DULERA)  100-5 MCG/ACT inhaler 2 puff  2 puff Inhalation BID Sanjuan Dame, MD   2 puff at 06/17/20 0739  . norepinephrine (LEVOPHED) 4mg  in 231mL premix infusion  2-10 mcg/min Intravenous Titrated Anders Simmonds, MD 11.25 mL/hr at 06/17/20 1000 3 mcg/min at 06/17/20 1000  . nutrition supplement (JUVEN) (JUVEN) powder packet 1 packet  1 packet Oral BID BM Hunsucker, Bonna Gains, MD   1 packet at 06/16/20 1443  . pantoprazole (PROTONIX) injection 40 mg  40 mg Intravenous QHS Sanjuan Dame, MD   40 mg at 06/16/20 2128  . polyethylene glycol (MIRALAX / GLYCOLAX) packet 17 g  17 g Oral Daily PRN Sanjuan Dame, MD      . sodium phosphate 30 mmol in dextrose 5 % 250 mL infusion  30 mmol Intravenous Once Hunsucker, Bonna Gains, MD 43 mL/hr at 06/17/20 1232 IV Pump Association at 06/17/20 1232  . thiamine tablet 50 mg  50 mg Oral Daily Sanjuan Dame, MD   50 mg at 06/16/20 9030      Review of Systems: 10 systems reviewed and negative except per interval history/subjective  Physical Exam: Vitals:   06/17/20 1030 06/17/20 1148  BP: (!) 88/62   Pulse: (!) 112   Resp: (!) 22   Temp:  (!) 97.4 F (36.3 C)  SpO2: 90%    Total I/O In: 334.4 [I.V.:288.4; IV Piggyback:46] Out: -   Intake/Output Summary (Last 24 hours) at 06/17/2020 1240 Last data filed at 06/17/2020 1000 Gross per 24 hour  Intake 1766.11 ml  Output 525 ml  Net 1241.11 ml   General: Chronically ill appearing, lying in bed HEENT: anicteric sclera, oropharynx clear without lesions CV: tachycardia, no obvious murmur Lungs: Bilateral chest rise, normal work of breathing Abd: soft, non-tender, non-distended, no sig ascites Skin:  Bilateral lower extremity erythema persists, scattered abrasions Psych: alert, engaged, appropriate mood and affect Musculoskeletal: mild edema bilateral LE's Neuro: normal speech, no gross focal deficits    Test Results I personally reviewed new and old clinical labs and radiology tests Lab  Results  Component Value Date   NA 137 06/17/2020   K 3.7 06/17/2020   CL 105 06/17/2020   CO2 19 (L) 06/17/2020   BUN 42 (H) 06/17/2020   CREATININE 2.65 (H) 06/17/2020   GFR 83.16 12/16/2019   CALCIUM 7.2 (L) 06/17/2020   ALBUMIN 1.5 (L) 06/17/2020   PHOS 1.1 (L) 06/17/2020

## 2020-06-18 ENCOUNTER — Inpatient Hospital Stay (HOSPITAL_COMMUNITY): Payer: Medicare HMO

## 2020-06-18 DIAGNOSIS — E43 Unspecified severe protein-calorie malnutrition: Secondary | ICD-10-CM | POA: Diagnosis not present

## 2020-06-18 DIAGNOSIS — J9601 Acute respiratory failure with hypoxia: Secondary | ICD-10-CM

## 2020-06-18 DIAGNOSIS — Z515 Encounter for palliative care: Secondary | ICD-10-CM | POA: Diagnosis not present

## 2020-06-18 DIAGNOSIS — J90 Pleural effusion, not elsewhere classified: Secondary | ICD-10-CM | POA: Diagnosis not present

## 2020-06-18 DIAGNOSIS — R918 Other nonspecific abnormal finding of lung field: Secondary | ICD-10-CM | POA: Diagnosis not present

## 2020-06-18 DIAGNOSIS — R5383 Other fatigue: Secondary | ICD-10-CM

## 2020-06-18 DIAGNOSIS — N170 Acute kidney failure with tubular necrosis: Secondary | ICD-10-CM | POA: Diagnosis not present

## 2020-06-18 DIAGNOSIS — A419 Sepsis, unspecified organism: Secondary | ICD-10-CM | POA: Diagnosis not present

## 2020-06-18 DIAGNOSIS — N179 Acute kidney failure, unspecified: Secondary | ICD-10-CM | POA: Diagnosis not present

## 2020-06-18 DIAGNOSIS — Z7189 Other specified counseling: Secondary | ICD-10-CM | POA: Diagnosis not present

## 2020-06-18 DIAGNOSIS — J810 Acute pulmonary edema: Secondary | ICD-10-CM

## 2020-06-18 DIAGNOSIS — L03119 Cellulitis of unspecified part of limb: Secondary | ICD-10-CM | POA: Diagnosis not present

## 2020-06-18 DIAGNOSIS — R6521 Severe sepsis with septic shock: Secondary | ICD-10-CM | POA: Diagnosis not present

## 2020-06-18 DIAGNOSIS — K7030 Alcoholic cirrhosis of liver without ascites: Secondary | ICD-10-CM | POA: Diagnosis not present

## 2020-06-18 DIAGNOSIS — R1310 Dysphagia, unspecified: Secondary | ICD-10-CM | POA: Diagnosis not present

## 2020-06-18 LAB — GLUCOSE, CAPILLARY
Glucose-Capillary: 159 mg/dL — ABNORMAL HIGH (ref 70–99)
Glucose-Capillary: 136 mg/dL — ABNORMAL HIGH (ref 70–99)
Glucose-Capillary: 86 mg/dL (ref 70–99)
Glucose-Capillary: 85 mg/dL (ref 70–99)
Glucose-Capillary: 97 mg/dL (ref 70–99)
Glucose-Capillary: 98 mg/dL (ref 70–99)

## 2020-06-18 LAB — RENAL FUNCTION PANEL
Albumin: 1.4 g/dL — ABNORMAL LOW (ref 3.5–5.0)
Anion gap: 14 (ref 5–15)
BUN: 40 mg/dL — ABNORMAL HIGH (ref 8–23)
CO2: 20 mmol/L — ABNORMAL LOW (ref 22–32)
Calcium: 7.3 mg/dL — ABNORMAL LOW (ref 8.9–10.3)
Chloride: 105 mmol/L (ref 98–111)
Creatinine, Ser: 2.32 mg/dL — ABNORMAL HIGH (ref 0.61–1.24)
GFR, Estimated: 30 mL/min — ABNORMAL LOW (ref >60)
Glucose, Bld: 148 mg/dL — ABNORMAL HIGH (ref 70–99)
Phosphorus: 2.4 mg/dL — ABNORMAL LOW (ref 2.5–4.6)
Potassium: 3.1 mmol/L — ABNORMAL LOW (ref 3.5–5.1)
Sodium: 139 mmol/L (ref 135–145)

## 2020-06-18 LAB — CULTURE, BLOOD (ROUTINE X 2)
Culture: NO GROWTH
Culture: NO GROWTH

## 2020-06-18 LAB — MAGNESIUM
Magnesium: 2.3 mg/dL (ref 1.7–2.4)
Magnesium: 2.3 mg/dL (ref 1.7–2.4)

## 2020-06-18 LAB — PHOSPHORUS: Phosphorus: 2.2 mg/dL — ABNORMAL LOW (ref 2.5–4.6)

## 2020-06-18 MED ORDER — FUROSEMIDE 10 MG/ML IJ SOLN
40.0000 mg | Freq: Once | INTRAMUSCULAR | Status: AC
  Administered 2020-06-18: 40 mg via INTRAVENOUS
  Filled 2020-06-18: qty 4

## 2020-06-18 MED ORDER — POTASSIUM CHLORIDE 20 MEQ PO PACK
40.0000 meq | PACK | Freq: Once | ORAL | Status: AC
  Administered 2020-06-18: 40 meq
  Filled 2020-06-18: qty 2

## 2020-06-18 NOTE — Progress Notes (Signed)
  Speech Language Pathology Treatment: Dysphagia  Patient Details Name: Gabriel Ramos MRN: [id number] DOB: Jun 10, 1950 Today's Date: 06/18/2020 Time: 7672-0947 SLP Time Calculation (min) (ACUTE ONLY): 12 min  Assessment / Plan / Recommendation Clinical Impression  Pt was seen for dysphagia treatment and was cooperative throughout the session. Pt reported that he has not wanted much to eat due to difficulty swallowing and potential for food/liquid "sticking". Trials were limited due to pt's refusal of all solids. However, he tolerated thin liquids via straw without symptoms of oropharyngeal dysphagia. Pt's RN raised concerns regarding possible aspiration due to results of today's CXR and he was made NPO thereafter. SLP questions the impact of esophageal dysmotility and possible narrowing on this, and whether a GI consult may be beneficial if this aligns with pt's GOC. Pt's p.o. diet may be resumed to his preferred consistency of full liquids with further advancement per his request/MD's clearance. Further skilled SLP services are not clinically indicated at this time.    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 CXR 1/13: Mixed heterogeneous interstitial and airspace opacities which are  most coalescent in the retrocardiac space and right infrahilar lung.  Findings could reflect infection, sequela of aspiration, pulmonary  edema or some combination there of. Esophagram 1/12: 13 mm barium tablet would not pass through the gastroesophageal  junction, likely due to a combination of slight narrowing and  dysmotility.      SLP Plan  Discharge SLP treatment due to (comment);All goals met       Recommendations  Diet recommendations:  (Pt was receiving full liquids per his request up until 1/13 when he was made NPO for concerns regarding aspiration.  This diet may be continued from an oropharyngeal standpoint.) Liquids provided via: Cup;Straw;Teaspoon Medication Administration: Whole meds with liquid Supervision: Staff to assist with self feeding Compensations: Minimize environmental distractions;Slow rate;Small sips/bites Postural Changes and/or Swallow Maneuvers: Seated upright 90 degrees                Oral Care Recommendations: Oral care BID Follow up Recommendations: None SLP Visit Diagnosis: Dysphagia, unspecified (R13.10) Plan: Discharge SLP treatment due to (comment);All goals met       Lurleen Soltero I. Hardin Negus, [place], [place] Office number [phone] Pager [place] 06/18/2020, 10:11 AM

## 2020-06-18 NOTE — Progress Notes (Addendum)
[facility] Progress Note Patient Name: Gabriel Ramos DOB: 08/21/1950 MRN: [id number]   Date of Service  06/18/2020  HPI/Events of Note  Review of portable CXR reveals: 1. Mixed heterogeneous interstitial and airspace opacities which are most coalescent in the retrocardiac space and right infrahilar lung. Findings could reflect infection, sequela of aspiration, pulmonary edema or some combination there of. 2. Trace right and small left pleural effusions. 3. Transesophageal tube tip near the duodenal bulb/gastric antrum.   Plan: 1. Lasix 40 mg IV X 1 now.      Intervention Category Major Interventions: Other:  Lysle Dingwall 06/18/2020, 6:26 AM

## 2020-06-18 NOTE — Progress Notes (Signed)
[facility] Progress Note Patient Name: Gabriel Ramos DOB: 1950-06-11 MRN: [id number]   Date of Service  06/18/2020  HPI/Events of Note  Nursing reports increase in weight this AM and increased O2 requirement for 5 L/min to 8 L/min. Also noted I/O to be positive 7676 mL since admission.   eICU Interventions  1. Portable CXR STAT.     Intervention Category Major Interventions: Hypoxemia - evaluation and management  Lysle Dingwall 06/18/2020, 5:49 AM

## 2020-06-18 NOTE — Progress Notes (Signed)
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  I have reviewed prior notes from infectious disease, GI, cardiology.  Cardiology work-up has shown normal LVEF and normal right heart pressures.  He is maintained on torsemide and Aldactone.  Last creatinine 03/2020 was normal. For the last 2 weeks he has developed nausea and vomiting and was unable to take much orally.  He reports drinking Smirnoff lites but for the past month has changed to a shot of brandy every morning to prevent shakes.  For the past few days he was unable to take this.  Developed dyspnea which brought him to the emergency room found to have AKI and hypotension, blood pressure improved with 3 L of fluids and has not required pressors so far. Received empiric antibiotics for lactate of 5.8 with aztreonam/vancomycin/Flagyl. PCCM consulted for further management  Past Medical History:  Hep C/EtOH cirrhosis -no varices on last EGD, no overt hepatic encephalopathy Chronic hypoalbuminemia with pedal edema Normal LV function, normal right heart pressures EtOH use disorder Opiate use disorder, on Suboxone Asthma  Significant Hospital Events:  1/8 > Admission 1/12 low dose NE, UOP stable, Cr continues to improve 1/13 Worsening tachypnea, hypoxemia, had N/V overnight, CXR looks wet with possible aspiration pna, given IV lasix, pressors off, Cr continues to improve  Consults:  Nephrology  Procedures:  n/a  Significant Diagnostic Tests:  CT  head 1/7 >> No acute intracranial abnormality. Generalized atrophy and chronic small vessel ischemia. Chronic paranasal sinus disease. Bubbly debris within the right greater than left maxillary sinus may be acute on chronic sinus disease.  [place] abd 1/8 >> No acute findings. No gallstones. No evidence of acute cholecystitis. No ascites. Fatty infiltration of the liver.   Renal US 1/8 >> Technically challenging exam given the presence of extensive support devices and patient's or clinical status. Likely increased bilateral renal cortical echogenicity compatible with medical renal disease. Minimally complex cyst in the upper pole right kidney measuring 1.4cm, not significantly changed from comparison Imaging in 2018. No dedicated follow-up imaging is typically warranted. Additional simple appearing cyst is seen in the upper pole left kidney as well  Micro Data:  BCx 1/8 >> NGTD UCx 1/8 >> NGTD   Antimicrobials:  Aztreonam 1/8 >>1/10 Vancomycin 1/8>>1/10 Flagyl 1/8 Cefazolin 1/11>>  Interim History / Subjective:  Worsening respiratory status - CXR wet, BP and Cr better.  Objective   Blood pressure 91/74, pulse (!) 106, temperature (!) 97.5 F (36.4 C), temperature source Oral, resp. rate (!) 22, height '5\' 10"'  (1.778 m), weight 69 kg, SpO2 (!) 86 %.        Intake/Output Summary (Last 24 hours) at 06/18/2020 0933 Last data filed at 06/18/2020 0800 Gross per 24 hour  Intake 2919.79 ml  Output 500 ml  Net 2419.79 ml   Filed Weights   06/16/20 0500 06/17/20 0500 06/18/20 0500  Weight: 57.2 kg 61 kg 69 kg    Examination: General: Chronically ill-appearing, no acute distress HENT: Normocephalic, atraumatic. Lungs: mild tachypnea, on Glide, crackles Cardiovascular: Regular rate,  rhythm. No m/r/g Abdomen: Soft, non-tender. No ascites appreciated Extremities: BLE erythema improved from yesterday, continues to be tender. Bandages over wounds in place. Neuro: Awake, alert, oriented x4. GU: No  Foley  Resolved Hospital Problem list   Anion gap metabolic acidosis  Assessment & Plan:  Mr. Isabell is 70yo male w/ alcohol-related fatty liver disease, alcohol and opiate use disorder admitted 1/8 w/ septic shock from bilateral lower extremity cellulitis w/ AKI.  #Septic shock 2/2 BLE cellulitis Patient w/ minimal pressor support, on 2 Levo. BP difficult to control given acute infection in setting of chronic liver disease. Will increase midodrine today in hopes to d/c levophed. Checking inflammatory markers, can discuss MRI for possible osteo. - finished cefazolin (day 5 06/17/20) - Midodrine 38m TID - ESR normal, CRP mildly elevated - MAP goal >65  #Acute Hypoxemic Respiratory Failure: In setting of N/V, suspect aspiratoion. CCXR 1/13 looks wet. --IV lasix 40 mg, may need higher dose with AKI --O2 sats > 88 --Stop maintenance fluids  #Acute kidney injury Nephrology following, appreciate recs. Renal function improving. UOP adequate.  - Daily RFP - Strict I&O's - Avoid nephrotoxic medications  #EtOH fatty liver disease #Chronic pedal edema Patient has difficult social situation and home and appears to have poor insight into his chronic liver disease. He is open to discussion with Palliative. EGD in March 2021, follows with gastroenterology.  - C/w thiamine, folate qd - Zofran PRN - Palliative consult  #Dysphagia Patient mentions he has had trouble swallowing in the past, says he was told he has a "ring in his throat" that makes swallowing difficult. 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 At bedside yesterday patient drinking Ensure without difficulties. Will have speech evaluate patient. - SLP eval, barium swallow - Cortrak  #Asthma Continue w/ nebs PRN. - Duonebs q4 PRN  #Pain/chronic opiate use disorder on Suboxone Patient has not requested Fentanyl over the last few days. Will switch to dilaudid PRN. - IV  dilaudid [id number] PRN  #Hypophosphatemia #Hypokalemia Repleting this AM. Mg normal. Possibly component of refeeding syndrome as he was able to start eating some pudding, Ensure yesterday. Will f/u labs tomorrow. - Mg, Phos, BMP in AM  Best practice (evaluated daily)  Diet: Clear liquids Pain/Anxiety/Delirium protocol (if indicated): Dilaudid PRN VAP protocol (if indicated): N/A DVT prophylaxis: SubQ heparin GI prophylaxis: N/A Glucose control: N/A Mobility: Bedrest Disposition: ICU  Goals of Care:  Last date of multidisciplinary goals of care discussion: Palliative has seen - per palliative patient wants everything despite not being transplant candidate and no reversibility to underlying cirrhosis Follow up goals of care discussion due: 1/20 Code Status: Full  Labs   CBC: Recent Labs  Lab 06/13/20 0141 06/13/20 0154 06/13/20 1128 06/14/20 0128 06/16/20 0216 06/17/20 0151  WBC 8.0  --  6.9 7.8 5.1 4.9  NEUTROABS 7.4  --   --   --   --   --   HGB 10.3* 10.5* 8.6* 8.3* 7.9* 9.3*  HCT 30.9* 31.0* 23.8* 22.6* 23.1* 27.0*  MCV 101.0*  --  96.7 97.4 98.7 99.6  PLT 237  --  174 136* 77* 66*    Basic Metabolic Panel: Recent Labs  Lab 06/15/20 0129 06/15/20 0742 06/15/20 1300 06/16/20 0216 06/17/20 0151 06/17/20 1429 06/17/20 1745 06/18/20 0615  NA 135  --  133* 136 137  --   --  139  K 2.5* 3.1* 3.5 3.3* 3.7  --   --  3.1*  CL 102  --  100 104 105  --   --  105  CO2 20*  --  20* 22 19*  --   --  20*  GLUCOSE 84  --  104* 99 81  --   --  148*  BUN 52*  --  49* 43* 42*  --   --  40*  CREATININE 4.22*  --  3.72* 3.30* 2.65*  --   --  2.32*  CALCIUM 7.4*  --  7.4* 7.5* 7.2*  --   --  7.3*  MG  --   --  2.0 1.7 1.4* 2.8* 2.7* 2.3  PHOS 3.4  --   --  1.9*  1.8* 1.1* 2.1* 3.5 2.4*   GFR: Estimated Creatinine Clearance: 30.2 mL/min (A) (by C-G formula based on SCr of 2.32 mg/dL (H)). Recent Labs  Lab 06/13/20 0141 06/13/20 0425 06/13/20 0840 06/13/20 1128  06/14/20 0128 06/16/20 0216 06/17/20 0151  WBC 8.0  --   --  6.9 7.8 5.1 4.9  LATICACIDVEN 5.8* 4.1* 2.8*  --   --   --   --     Liver Function Tests: Recent Labs  Lab 06/13/20 0141 06/14/20 0128 06/15/20 0129 06/16/20 0216 06/17/20 0151 06/18/20 0615  AST 35  --   --   --   --   --   ALT 20  --   --   --   --   --   ALKPHOS 127*  --   --   --   --   --   BILITOT 2.0*  --   --   --   --   --   PROT 4.6*  --   --   --   --   --   ALBUMIN 1.7* 1.5* 2.0* 1.7* 1.5* 1.4*   Recent Labs  Lab 06/13/20 1128  LIPASE 16   Recent Labs  Lab 06/13/20 0141  AMMONIA 48*    ABG    Component Value Date/Time   HCO3 12.1 (L) 06/13/2020 0154   TCO2 13 (L) 06/13/2020 0154   ACIDBASEDEF 12.0 (H) 06/13/2020 0154   O2SAT 83.0 06/13/2020 0154     Coagulation Profile: No results for input(s): INR, PROTIME in the last 168 hours.  Cardiac Enzymes: Recent Labs  Lab 06/13/20 1128  CKTOTAL 208    HbA1C: No results found for: HGBA1C  CBG: Recent Labs  Lab 06/17/20 1530 06/17/20 1922 06/17/20 2322 06/18/20 0322 06/18/20 0736  GLUCAP 148* 127* 155* 159* 136*    Review of Systems:   Pain in lower extremities No nausea/vomiting/chest pain/fevers overnight.  Past Medical History:  He,  has a past medical history of Abnormal CXR (09/16/2016), Alcohol dependence with unspecified alcohol-induced disorder ([place]), Alcoholic hepatitis without ascites, ALLERGIC RHINITIS (09/14/2006), Anxiety, Asthma, chronic, severe persistent, uncomplicated (9/74/1638), BPH (benign prostatic hyperplasia), Concentric left ventricular hypertrophy, DEPRESSION (09/14/2006), Echocardiogram, GERD (gastroesophageal reflux disease), Gout (03/28/2012), Hepatitis C, History of nuclear stress test, History of paroxysmal supraventricular tachycardia, Hypertension, Hypoalbuminemia, Hypokalemia, Nausea, Opioid abuse ([place]), Simple chronic bronchitis ([place]), SVT (supraventricular tachycardia) ([place]), SVT (supraventricular  tachycardia) ([place]) (08/12/2018), and Vertigo.   Surgical History:   Past Surgical History:  Procedure Laterality Date  . polyps remvoed from nose    . RIGHT HEART CATH N/A 03/13/2020   Procedure: RIGHT HEART CATH;  Surgeon: Nelva Bush, MD;  Location: [facility];  Service: Cardiovascular;  Laterality: N/A;  . UPPER GI ENDOSCOPY  approx 10 years     Social History:   reports that he has never smoked. He has never used smokeless tobacco. He reports current alcohol use of about 3.0 standard drinks of alcohol per week. He reports current drug use. Frequency: 7.00 times per week. Drug: Marijuana.   Family History:  His family history includes Asthma in his brother; Heart disease in his brother and father; Heart murmur in his mother; Liver cancer in his brother. There is no history of Colon cancer, Esophageal cancer, Rectal cancer, or Stomach cancer.   Allergies Allergies  Allergen Reactions  . Aspirin Shortness Of Breath and Swelling    REACTION: ASTHMA  . Penicillins Swelling    Makes lips and tongue swell up Has patient had a PCN reaction causing immediate rash, facial/tongue/throat swelling, SOB or lightheadedness with hypotension: yes Has patient had a PCN reaction causing severe rash involving mucus membranes or skin necrosis: no Has patient had a PCN reaction that required hospitalization: no Has patient had a PCN reaction occurring within the last 10 years: no, a little over 10 years If all of the above answers are "NO", then may proceed with Cephalosporin use.      Home Medications  Prior to Admission medications   Medication Sig Start Date End Date Taking? Authorizing Provider  albuterol (PROAIR HFA) 108 (90 Base) MCG/ACT inhaler Inhale 2 puffs into the lungs every 6 (six) hours as needed for wheezing or shortness of breath. 02/25/20  Yes Spero Geralds, MD  albuterol (PROVENTIL) (2.5 MG/3ML) 0.083% nebulizer solution Take 3 mLs (2.5 mg total) by nebulization  every 6 (six) hours as needed for wheezing or shortness of breath. 09/15/16  Yes Tanda Rockers, MD  Buprenorphine HCl-Naloxone HCl 8-2 MG FILM Place 1 Film under the tongue every other day.  08/06/18  Yes [provider]  diphenhydrAMINE (BENADRYL) 25 MG tablet Take 25 mg by mouth every 6 (six) hours as needed for itching or allergies.   Yes [provider]  FLUoxetine (PROZAC) 20 MG capsule Take 20 mg by mouth daily.   Yes [provider]  fluticasone (FLONASE) 50 MCG/ACT nasal spray Place 1 spray into both nostrils daily. 02/25/20  Yes Spero Geralds, MD  fluticasone (FLOVENT HFA) 110 MCG/ACT inhaler Inhale 2 puffs into the lungs at bedtime. 02/25/20  Yes Spero Geralds, MD  Nebulizers (NEBULIZER COMPRESSOR) MISC Use as directed Dx: 01/10/17  Yes Tanda Rockers, MD  ondansetron (ZOFRAN) 4 MG tablet Take 1 tablet (4 mg total) by mouth every 6 (six) hours. Patient taking differently: Take 4 mg by mouth every 8 (eight) hours as needed for nausea. 09/15/16  Yes Tanda Rockers, MD  pantoprazole (PROTONIX) 40 MG tablet TAKE 1 TABLET (40 MG TOTAL) BY MOUTH DAILY. TAKE 30-60 MIN BEFORE FIRST MEAL OF THE DAY Patient taking differently: Take 40 mg by mouth at bedtime. Take 30-60 min before first meal of the day 07/07/17  Yes Tanda Rockers, MD  potassium chloride 20 MEQ/15ML (10%) SOLN Take 15 mLs (20 mEq total) by mouth daily. 02/19/20  Yes Richardson Dopp T, PA-C  Spacer/Aero-Holding Chambers (AEROCHAMBER MV) inhaler Use as instructed 10/28/19  Yes Spero Geralds, MD  spironolactone (ALDACTONE) 100 MG tablet Take 100 mg by mouth daily.  10/17/19  Yes [provider]  torsemide (DEMADEX) 20 MG tablet Take 1 tablet (20 mg total) by mouth daily. 01/17/20  Yes Richardson Dopp T, PA-C     Critical care time:  CRITICAL CARE Performed by: Bonna Gains Linzie Criss   Total critical care time: 41 minutes  Critical care time was exclusive of separately billable procedures and  treating other patients.  Critical care was necessary to treat or prevent imminent or life-threatening deterioration.  Critical care was time spent personally by me on the following activities:  development of treatment plan with patient and/or surrogate as well as nursing, discussions with consultants, evaluation of patient's response to treatment, examination of patient, obtaining history from patient or surrogate, ordering and performing treatments and interventions, ordering and review of laboratory studies, ordering and review of radiographic studies, pulse oximetry and re-evaluation of patient's condition.

## 2020-06-18 NOTE — Progress Notes (Signed)
Patient ID: Gabriel Ramos, male   DOB: [birth year], 70 y.o.   MRN: [id number]  Medical records reviewed and NP visited patient at the bedside as a follow up for palliative medicine needs and emotional support.  Patient appears weaker and more lethargic today.  He tells me he is "too tired to talk"  With patient's permission I spoke to his significant other Patty via telephone and updated her on medical condition.  Her questions and concerns were addressed to the best my ability.  Patient's son is now documented H POA  Ongoing conversation and education regarding the seriousness of his current medical situation and his high risk for decompensation 2/2 to multiple comorbidites  Patient reiterates and SO supports that they are hopeful for improvement and open to all offered and available medical interventions to prolong life.  Discussed with patient and Patty/SO the importance of continued conversation with each other  and the medical providers regarding overall plan of care and treatment options,  ensuring decisions are within the context of the patients values and GOCs.  Discussed with Dr Silas Flood  PMT will continue to support holistically   This nurse practitioner informed  the patient/family and the attending that I will be out of the hospital until Monday morning.  If the patient is still hospitalized I will follow-up at that time.  Call palliative medicine team phone # [phone] with questions or concerns in the interim  Total time spent on the unit was 25 minutes  Greater than 50% of the time was spent in counseling and coordination of care  Wadie Lessen NP  Palliative Medicine Team Team Phone # [phone] Pager [phone]

## 2020-06-19 DIAGNOSIS — N179 Acute kidney failure, unspecified: Secondary | ICD-10-CM | POA: Diagnosis not present

## 2020-06-19 DIAGNOSIS — E43 Unspecified severe protein-calorie malnutrition: Secondary | ICD-10-CM | POA: Diagnosis not present

## 2020-06-19 DIAGNOSIS — K7030 Alcoholic cirrhosis of liver without ascites: Secondary | ICD-10-CM | POA: Diagnosis not present

## 2020-06-19 DIAGNOSIS — A419 Sepsis, unspecified organism: Secondary | ICD-10-CM | POA: Diagnosis not present

## 2020-06-19 DIAGNOSIS — L03119 Cellulitis of unspecified part of limb: Secondary | ICD-10-CM | POA: Diagnosis not present

## 2020-06-19 DIAGNOSIS — R6521 Severe sepsis with septic shock: Secondary | ICD-10-CM | POA: Diagnosis not present

## 2020-06-19 LAB — RENAL FUNCTION PANEL
Albumin: 1.5 g/dL — ABNORMAL LOW (ref 3.5–5.0)
Anion gap: 13 (ref 5–15)
BUN: 50 mg/dL — ABNORMAL HIGH (ref 8–23)
CO2: 20 mmol/L — ABNORMAL LOW (ref 22–32)
Calcium: 7.7 mg/dL — ABNORMAL LOW (ref 8.9–10.3)
Chloride: 109 mmol/L (ref 98–111)
Creatinine, Ser: 2.51 mg/dL — ABNORMAL HIGH (ref 0.61–1.24)
GFR, Estimated: 27 mL/min — ABNORMAL LOW (ref >60)
Glucose, Bld: 109 mg/dL — ABNORMAL HIGH (ref 70–99)
Phosphorus: 2 mg/dL — ABNORMAL LOW (ref 2.5–4.6)
Potassium: 3.6 mmol/L (ref 3.5–5.1)
Sodium: 142 mmol/L (ref 135–145)

## 2020-06-19 LAB — DIC (DISSEMINATED INTRAVASCULAR COAGULATION)PANEL
D-Dimer, Quant: 1.43 ug/mL-FEU — ABNORMAL HIGH (ref 0.00–0.50)
Fibrinogen: 379 mg/dL (ref 210–475)
INR: 1.7 — ABNORMAL HIGH (ref 0.8–1.2)
Platelets: 34 10*3/uL — ABNORMAL LOW (ref 150–400)
Prothrombin Time: 19.6 seconds — ABNORMAL HIGH (ref 11.4–15.2)
Smear Review: NONE SEEN
aPTT: 49 seconds — ABNORMAL HIGH (ref 24–36)

## 2020-06-19 LAB — GLUCOSE, CAPILLARY
Glucose-Capillary: 118 mg/dL — ABNORMAL HIGH (ref 70–99)
Glucose-Capillary: 103 mg/dL — ABNORMAL HIGH (ref 70–99)
Glucose-Capillary: 105 mg/dL — ABNORMAL HIGH (ref 70–99)
Glucose-Capillary: 96 mg/dL (ref 70–99)
Glucose-Capillary: 93 mg/dL (ref 70–99)

## 2020-06-19 LAB — CBC
HCT: 21.9 % — ABNORMAL LOW (ref 39.0–52.0)
Hemoglobin: 7.2 g/dL — ABNORMAL LOW (ref 13.0–17.0)
MCH: 33.6 pg (ref 26.0–34.0)
MCHC: 32.9 g/dL (ref 30.0–36.0)
MCV: 102.3 fL — ABNORMAL HIGH (ref 80.0–100.0)
Platelets: 28 10*3/uL — CL (ref 150–400)
RBC: 2.14 MIL/uL — ABNORMAL LOW (ref 4.22–5.81)
RDW: 17.2 % — ABNORMAL HIGH (ref 11.5–15.5)
WBC: 3.2 10*3/uL — ABNORMAL LOW (ref 4.0–10.5)
nRBC: 0 % (ref 0.0–0.2)

## 2020-06-19 MED ORDER — POTASSIUM & SODIUM PHOSPHATES 280-160-250 MG PO PACK
1.0000 | PACK | Freq: Three times a day (TID) | ORAL | Status: DC
  Filled 2020-06-19 (×2): qty 1

## 2020-06-19 MED ORDER — CHLORHEXIDINE GLUCONATE 0.12 % MT SOLN
15.0000 mL | Freq: Two times a day (BID) | OROMUCOSAL | Status: DC
  Administered 2020-06-19 – 2020-06-21 (×4): 15 mL via OROMUCOSAL
  Filled 2020-06-19 (×3): qty 15

## 2020-06-19 MED ORDER — ORAL CARE MOUTH RINSE
15.0000 mL | Freq: Two times a day (BID) | OROMUCOSAL | Status: DC
  Administered 2020-06-19 – 2020-06-21 (×6): 15 mL via OROMUCOSAL

## 2020-06-19 MED ORDER — METOCLOPRAMIDE HCL 5 MG/ML IJ SOLN
10.0000 mg | Freq: Once | INTRAMUSCULAR | Status: DC
  Filled 2020-06-19: qty 2

## 2020-06-19 MED ORDER — LORAZEPAM 2 MG/ML IJ SOLN
0.5000 mg | Freq: Four times a day (QID) | INTRAMUSCULAR | Status: DC | PRN
  Administered 2020-06-19: 0.5 mg via INTRAVENOUS
  Filled 2020-06-19: qty 1

## 2020-06-19 MED ORDER — POTASSIUM & SODIUM PHOSPHATES 280-160-250 MG PO PACK
1.0000 | PACK | Freq: Three times a day (TID) | ORAL | Status: AC
  Administered 2020-06-19 (×3): 1
  Filled 2020-06-19 (×3): qty 1

## 2020-06-19 NOTE — Progress Notes (Signed)
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  I have reviewed prior notes from infectious disease, GI, cardiology.  Cardiology work-up has shown normal LVEF and normal right heart pressures.  He is maintained on torsemide and Aldactone.  Last creatinine 03/2020 was normal. For the last 2 weeks he has developed nausea and vomiting and was unable to take much orally.  He reports drinking Smirnoff lites but for the past month has changed to a shot of brandy every morning to prevent shakes.  For the past few days he was unable to take this.  Developed dyspnea which brought him to the emergency room found to have AKI and hypotension, blood pressure improved with 3 L of fluids and has not required pressors so far. Received empiric antibiotics for lactate of 5.8 with aztreonam/vancomycin/Flagyl. PCCM consulted for further management  Past Medical History:  Hep C/EtOH cirrhosis -no varices on last EGD, no overt hepatic encephalopathy Chronic hypoalbuminemia with pedal edema Normal LV function, normal right heart pressures EtOH use disorder Opiate use disorder, on Suboxone Asthma  Significant Hospital Events:  1/8 > Admission 1/12 low dose NE, UOP stable, Cr continues to improve 1/13 Worsening tachypnea, hypoxemia, had N/V overnight, CXR looks wet with possible aspiration pna, given IV lasix, pressors off, Cr continues to improve  Consults:  Nephrology  Procedures:  n/a  Significant Diagnostic Tests:  CT  head 1/7 >> No acute intracranial abnormality. Generalized atrophy and chronic small vessel ischemia. Chronic paranasal sinus disease. Bubbly debris within the right greater than left maxillary sinus may be acute on chronic sinus disease.  [place] abd 1/8 >> No acute findings. No gallstones. No evidence of acute cholecystitis. No ascites. Fatty infiltration of the liver.   Renal US 1/8 >> Technically challenging exam given the presence of extensive support devices and patient's or clinical status. Likely increased bilateral renal cortical echogenicity compatible with medical renal disease. Minimally complex cyst in the upper pole right kidney measuring 1.4cm, not significantly changed from comparison Imaging in 2018. No dedicated follow-up imaging is typically warranted. Additional simple appearing cyst is seen in the upper pole left kidney as well  Micro Data:  BCx 1/8 >> NGTD UCx 1/8 >> NGTD   Antimicrobials:  Aztreonam 1/8 >>1/10 Vancomycin 1/8>>1/10 Flagyl 1/8 Cefazolin 1/11>> 1/12  Interim History / Subjective:   Remains on 8 L/min Profoundly weak but is interacting.  He states that he is having some difficulty swallowing Creatinine 2.32 >> 2.51 with lasix on 1/13   Objective   Blood pressure 91/61, pulse (!) 107, temperature (!) 97.3 F (36.3 C), temperature source Oral, resp. rate (!) 23, height 5\' 10"  (1.778 m), weight 69.7 kg, SpO2 95 %.        Intake/Output Summary (Last 24 hours) at 06/19/2020 0822 Last data filed at 06/19/2020 0600 Gross per 24 hour  Intake 1191.33 ml  Output 120 ml  Net 1071.33 ml   Filed Weights   06/17/20 0500 06/18/20 0500 06/19/20 0500  Weight: 61 kg 69 kg 69.7 kg  Examination: General: [name], in bed sitting up, weak but comfortable HENT: NG tube in place, oropharynx dry Lungs: Comfortable respiratory pattern, 8 L/min, bibasilar inspiratory crackles Cardiovascular: Regular, distant, no murmur Abdomen: Nondistended,  hypoactive bowel sounds Extremities: Foot drop boots and bandages in place, no warmth or erythema, mild tenderness to palpation, no significant edema Neuro: Awake, alert, globally weak, well oriented, moderate cough strength  Resolved Hospital Problem list   Anion gap metabolic acidosis  Assessment & Plan:  [name] is 70yo male w/ alcohol-related fatty liver disease, alcohol and opiate use disorder admitted 1/8 w/ septic shock from bilateral lower extremity cellulitis w/ AKI.  #Septic shock 2/2 BLE cellulitis Patient w/ minimal pressor support, on 2 Levo. BP difficult to control given acute infection in setting of chronic liver disease. Will increase midodrine today in hopes to d/c levophed. Checking inflammatory markers, can discuss MRI for possible osteo. - finished Abx on 1/12, following clinically -Not currently on pressors -Midodrine 10 mg 3 times daily  #Acute Hypoxemic Respiratory Failure: In setting of N/V, at risk aspiration.  Bilateral base predominant infiltrates chest x-ray 1/13 -Received Lasix x2 1/13 -Consider repeat diuresis depending on renal function trend -Careful with maintenance IV fluids -Wean oxygen as able, goal SPO2 > 88%  #Acute kidney injury Nephrology following, appreciate recs. Renal function improving. UOP adequate.  -Following BMP -Hold off on diuresis 1/14 -Strict I/O -Renal dose medications, avoid nephrotoxins  #EtOH fatty liver disease #Chronic pedal edema Patient has difficult social situation and home and appears to have poor insight into his chronic liver disease. He is open to discussion with Palliative. EGD in March 2021, follows with gastroenterology.  -Thiamine, folate ordered -Appreciate palliative care assistance, input -Zofran ordered as needed  #Dysphagia Patient mentions he has had trouble swallowing in the past, says he was told he has a "ring in his throat" that makes swallowing difficult. 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  Esophagram 1/12, barium tablet would not pass through the GE junction -Appreciate SLP input, okay for full liquid diet and advance as tolerated and per patient preference -Continuing tube feeding via NG tube for now  #Thrombocytopenia -Recheck CBC 1/14 -Consider possible causes, sepsis, DIC.  Consider HITT although timing questionable  #Asthma Continue w/ nebs PRN. -DuoNebs ordered if needed  #Pain/chronic opiate use disorder on Suboxone -Continue IV Dilaudid 0.5 mg if needed  #Hypophosphatemia #Hypokalemia Repleting this AM. Mg normal. Possibly component of refeeding syndrome as he was able to start eating some pudding, Ensure and receiving tube feeds -Replete phosphorus again 1/14 -Follow BMP, magnesium, phosphorus closely  Best practice (evaluated daily)  Diet: Full liquids Pain/Anxiety/Delirium protocol (if indicated): Dilaudid PRN VAP protocol (if indicated): N/A DVT prophylaxis: SubQ heparin GI prophylaxis: N/A Glucose control: N/A Mobility: Bedrest Disposition: ICU Family: Discussed status, prognosis, plans with son by phone 1/14  Goals of Care:  Last date of multidisciplinary goals of care discussion: Palliative has seen - per palliative patient wants everything despite not being transplant candidate and no reversibility to underlying cirrhosis Follow up goals of care discussion due: 1/20 Code Status: Full  Labs   CBC: Recent Labs  Lab 06/13/20 0141 06/13/20 0154 06/13/20 1128 06/14/20 0128 06/16/20 0216 06/17/20 0151  WBC 8.0  --  6.9 7.8 5.1 4.9  NEUTROABS 7.4  --   --   --   --   --   HGB 10.3* 10.5* 8.6* 8.3* 7.9*  9.3*  HCT 30.9* 31.0* 23.8* 22.6* 23.1* 27.0*  MCV 101.0*  --  96.7 97.4 98.7 99.6  PLT 237  --  174 136* 77* 66*    Basic Metabolic Panel: Recent Labs  Lab 06/15/20 1300 06/16/20 0216 06/17/20 0151 06/17/20 1429 06/17/20 1745 06/18/20 0615 06/18/20 1639 06/19/20 0046   NA 133* 136 137  --   --  139  --  142  K 3.5 3.3* 3.7  --   --  3.1*  --  3.6  CL 100 104 105  --   --  105  --  109  CO2 20* 22 19*  --   --  20*  --  20*  GLUCOSE 104* 99 81  --   --  148*  --  109*  BUN 49* 43* 42*  --   --  40*  --  50*  CREATININE 3.72* 3.30* 2.65*  --   --  2.32*  --  2.51*  CALCIUM 7.4* 7.5* 7.2*  --   --  7.3*  --  7.7*  MG 2.0 1.7 1.4* 2.8* 2.7* 2.3 2.3  --   PHOS  --  1.9*  1.8* 1.1* 2.1* 3.5 2.4* 2.2* 2.0*   GFR: Estimated Creatinine Clearance: 28.2 mL/min (A) (by C-G formula based on SCr of 2.51 mg/dL (H)). Recent Labs  Lab 06/13/20 0141 06/13/20 0425 06/13/20 0840 06/13/20 1128 06/14/20 0128 06/16/20 0216 06/17/20 0151  WBC 8.0  --   --  6.9 7.8 5.1 4.9  LATICACIDVEN 5.8* 4.1* 2.8*  --   --   --   --     Liver Function Tests: Recent Labs  Lab 06/13/20 0141 06/14/20 0128 06/15/20 0129 06/16/20 0216 06/17/20 0151 06/18/20 0615 06/19/20 0046  AST 35  --   --   --   --   --   --   ALT 20  --   --   --   --   --   --   ALKPHOS 127*  --   --   --   --   --   --   BILITOT 2.0*  --   --   --   --   --   --   PROT 4.6*  --   --   --   --   --   --   ALBUMIN 1.7*   < > 2.0* 1.7* 1.5* 1.4* 1.5*   < > = values in this interval not displayed.   Recent Labs  Lab 06/13/20 1128  LIPASE 16   Recent Labs  Lab 06/13/20 0141  AMMONIA 48*    ABG    Component Value Date/Time   HCO3 12.1 (L) 06/13/2020 0154   TCO2 13 (L) 06/13/2020 0154   ACIDBASEDEF 12.0 (H) 06/13/2020 0154   O2SAT 83.0 06/13/2020 0154     Coagulation Profile: No results for input(s): INR, PROTIME in the last 168 hours.  Cardiac Enzymes: Recent Labs  Lab 06/13/20 1128  CKTOTAL 208    HbA1C: No results found for: HGBA1C  CBG: Recent Labs  Lab 06/18/20 1516 06/18/20 2016 06/18/20 2307 06/19/20 0509 06/19/20 0800  GLUCAP 85 97 98 118* 103*      Critical care time:     CRITICAL CARE Performed by: Collene Gobble   Total critical care time: 33  minutes  Critical care time was exclusive of separately billable procedures and treating other patients.  Critical care was necessary to treat or prevent  imminent or life-threatening deterioration.  Critical care was time spent personally by me on the following activities:  development of treatment plan with patient and/or surrogate as well as nursing, discussions with consultants, evaluation of patient's response to treatment, examination of patient, obtaining history from patient or surrogate, ordering and performing treatments and interventions, ordering and review of laboratory studies, ordering and review of radiographic studies, pulse oximetry and re-evaluation of patient's condition.   Baltazar Apo, MD, PhD 06/19/2020, 8:37 AM [facility] [phone] or if no answer [phone]

## 2020-06-19 NOTE — Progress Notes (Addendum)
This chaplain is present for F/U spiritual care and notarizing the Pt. HCPOA.  The Pt. declined the visit, stating he is sick. The chaplain agreed to F/U later in the day.  *1320-The Pt. declined the opportunity to notarize HCPOA on the chaplain's return visit.

## 2020-06-19 NOTE — Progress Notes (Signed)
[facility] Progress Note Patient Name: Gabriel Ramos DOB: 12-23-1950 MRN: [id number]   Date of Service  06/19/2020  HPI/Events of Note  K 3.6, Phos 2.0, crea 2.5.    eICU Interventions  Continue to monitor for now.      Intervention Category Minor Interventions: Electrolytes abnormality - evaluation and management  Gabriel Ramos 06/19/2020, 4:45 AM

## 2020-06-19 NOTE — Progress Notes (Signed)
Nutrition Follow-up  DOCUMENTATION CODES:   Severe malnutrition in context of social or environmental circumstances  INTERVENTION:   Continue tube feeds at goal rate via Cortrak: - Osmolite 1.5 @ 60 ml/hr (1440 ml/day) - ProSource TF 45 ml TID  Tube feeding regimen provides 2280 kcal, 123 grams of protein, and 1097 ml of H2O.   - Continue Juven BID via tube, each packet provides 80 calories, 8 grams of carbohydrate, 2.5 grams of protein (collagen), 7 grams of L-arginine and 7 grams of L-glutamine; supplement contains CaHMB, Vitamins C, E, B12 and Zinc to promote wound healing  NUTRITION DIAGNOSIS:   Severe Malnutrition related to social / environmental circumstances (EtOH abuse, poor social support) as evidenced by severe muscle depletion,severe fat depletion.  Ongoing  GOAL:   Patient will meet greater than or equal to 90% of their needs  Met via TF at goal  MONITOR:   PO intake,Supplement acceptance,Labs,TF tolerance,Skin  REASON FOR ASSESSMENT:   Consult Enteral/tube feeding initiation and management  ASSESSMENT:   70 year old male who presented to the ED on 1/07with lethargy. PMH of EtOH abuse, opiate use disorder, hepatitis C with cirrhosis, chronic pedal edema, HTN. Pt admitted with AKI, septic shock.  1/12 - Cortrak placed, tip gastric  Discussed pt with RN and during ICU rounds. Pt continues to have poor PO intake. Tube feeding being advanced to goal rate via Cortrak.  Spoke with pt briefly at bedside. Pt reports that he cannot talk right now because he is too sick.  Pt now with moderate pitting generalized edema and mild to moderate pitting edema to BUE and BLE.  Current TF: Osmolite 1.5 @ 60 ml/hr, ProSource TF 45 ml TID  Medications reviewed and include: folic acid, IV reglan 10 mg once, Juven BID, protonix, phos-nak 1 packet x 3, thiamine  Labs reviewed: BUN 50, creatinine 2.51, phosphorus 2.0, hemoglobin 7.2, platelets 28  UOP: 162 ml x 24  hours  Diet Order:   Diet Order            Diet full liquid Room service appropriate? Yes; Fluid consistency: Thin  Diet effective now                 EDUCATION NEEDS:   Education needs have been addressed  Skin:  Skin Assessment: Skin Integrity Issues: Stage II: left buttock, sacrum, coccyx, right foot x 2 Stage III: right foot Unstageable: left heel, right knee, left knee Other: non-pressure wounds to left 2nd toe, left 4th toe, left pretibial, right foot x 2, right great toe  Last BM:  06/18/20 type 7  Height:   Ht Readings from Last 1 Encounters:  07/02/2020 _0  (1.778 m)    Weight:   Wt Readings from Last 1 Encounters:  06/19/20 69.7 kg    BMI:  Body mass index is 22.05 kg/m.  Estimated Nutritional Needs:   Kcal:  2100-2300  Protein:  110-130 grams  Fluid:  1.8 L/day    Gustavus Bryant, MS, RD, LDN Inpatient Clinical Dietitian Please see AMiON for contact information.

## 2020-06-19 NOTE — Progress Notes (Signed)
Notified provider patient extremely restless, pulling at lines/clothes/blankets and attempting to get out of bed after PRN ativan given. Provider gave verbal order to discontinue ativan. Provider notified of patient's bladder temp of 94 - order for warming blanket given.

## 2020-06-19 NOTE — Progress Notes (Signed)
Pharmacy Heparin Induced Thrombocytopenia (HIT) Note:  Gabriel Ramos is an 70 y.o. male being evaluated for HIT. Heparin was started 06/13/20 for DVT Prophylaxis, and baseline platelets were 174.   HIT labs were ordered on 1/14 when platelets dropped to 28.  Auto-populate labs: No results found for: HEPINDPLTAB, SRALOWDOSEHP, SRAHIGHDOSEH   CALCULATE SCORE:  4Ts (see the HIT Algorithm) Score  Thrombocytopenia +2  Timing +1  Thrombosis 0  Other causes of thrombocytopenia +1  Total 4     Recommendations (A or B) are based on available lab results (HIT antibody and/or SRA) and the HIT algorithm    A. HIT antibody result pending  Possible HIT    Order SRA:  No  Discontinue heparin / LMWH:  Yes  Initiate alternative anticoagulation:  No  Document heparin allergy:  No    B. SRA result availability  SRA not available   Name of MD Contacted: Dr Lamonte Sakai  Plan (Discussed with provider) Labs ordered:  HIT AB Heparin allergy:  No allergy documentation needed. Anticoagulation plans:  Discontinue heparin / LMWH  Gabriel Ramos, PharmD, BCPS, BCCCP Clinical Pharmacist [phone]  Please check AMION for all [place] [facility] numbers  06/19/2020 9:57 AM

## 2020-06-20 ENCOUNTER — Inpatient Hospital Stay (HOSPITAL_COMMUNITY): Payer: Medicare HMO

## 2020-06-20 DIAGNOSIS — R1310 Dysphagia, unspecified: Secondary | ICD-10-CM | POA: Diagnosis not present

## 2020-06-20 DIAGNOSIS — Z515 Encounter for palliative care: Secondary | ICD-10-CM | POA: Diagnosis not present

## 2020-06-20 DIAGNOSIS — Z7189 Other specified counseling: Secondary | ICD-10-CM | POA: Diagnosis not present

## 2020-06-20 DIAGNOSIS — K7030 Alcoholic cirrhosis of liver without ascites: Secondary | ICD-10-CM | POA: Diagnosis not present

## 2020-06-20 DIAGNOSIS — R6521 Severe sepsis with septic shock: Secondary | ICD-10-CM | POA: Diagnosis not present

## 2020-06-20 DIAGNOSIS — A419 Sepsis, unspecified organism: Secondary | ICD-10-CM | POA: Diagnosis not present

## 2020-06-20 DIAGNOSIS — J9690 Respiratory failure, unspecified, unspecified whether with hypoxia or hypercapnia: Secondary | ICD-10-CM | POA: Diagnosis not present

## 2020-06-20 DIAGNOSIS — L03119 Cellulitis of unspecified part of limb: Secondary | ICD-10-CM | POA: Diagnosis not present

## 2020-06-20 DIAGNOSIS — R5383 Other fatigue: Secondary | ICD-10-CM | POA: Diagnosis not present

## 2020-06-20 DIAGNOSIS — J90 Pleural effusion, not elsewhere classified: Secondary | ICD-10-CM | POA: Diagnosis not present

## 2020-06-20 DIAGNOSIS — N179 Acute kidney failure, unspecified: Secondary | ICD-10-CM | POA: Diagnosis not present

## 2020-06-20 LAB — BASIC METABOLIC PANEL
Anion gap: 13 (ref 5–15)
BUN: 65 mg/dL — ABNORMAL HIGH (ref 8–23)
CO2: 22 mmol/L (ref 22–32)
Calcium: 7.8 mg/dL — ABNORMAL LOW (ref 8.9–10.3)
Chloride: 112 mmol/L — ABNORMAL HIGH (ref 98–111)
Creatinine, Ser: 2.9 mg/dL — ABNORMAL HIGH (ref 0.61–1.24)
GFR, Estimated: 23 mL/min — ABNORMAL LOW (ref >60)
Glucose, Bld: 96 mg/dL (ref 70–99)
Potassium: 3.9 mmol/L (ref 3.5–5.1)
Sodium: 147 mmol/L — ABNORMAL HIGH (ref 135–145)

## 2020-06-20 LAB — PHOSPHORUS
Phosphorus: 1.1 mg/dL — ABNORMAL LOW (ref 2.5–4.6)
Phosphorus: 4.2 mg/dL (ref 2.5–4.6)

## 2020-06-20 LAB — GLUCOSE, CAPILLARY
Glucose-Capillary: 101 mg/dL — ABNORMAL HIGH (ref 70–99)
Glucose-Capillary: 90 mg/dL (ref 70–99)
Glucose-Capillary: 108 mg/dL — ABNORMAL HIGH (ref 70–99)
Glucose-Capillary: 106 mg/dL — ABNORMAL HIGH (ref 70–99)
Glucose-Capillary: 107 mg/dL — ABNORMAL HIGH (ref 70–99)
Glucose-Capillary: 124 mg/dL — ABNORMAL HIGH (ref 70–99)

## 2020-06-20 LAB — MAGNESIUM: Magnesium: 2.3 mg/dL (ref 1.7–2.4)

## 2020-06-20 LAB — CBC
HCT: 25.3 % — ABNORMAL LOW (ref 39.0–52.0)
Hemoglobin: 8.6 g/dL — ABNORMAL LOW (ref 13.0–17.0)
MCH: 34.4 pg — ABNORMAL HIGH (ref 26.0–34.0)
MCHC: 34 g/dL (ref 30.0–36.0)
MCV: 101.2 fL — ABNORMAL HIGH (ref 80.0–100.0)
Platelets: 44 10*3/uL — ABNORMAL LOW (ref 150–400)
RBC: 2.5 MIL/uL — ABNORMAL LOW (ref 4.22–5.81)
RDW: 17 % — ABNORMAL HIGH (ref 11.5–15.5)
WBC: 2.6 10*3/uL — ABNORMAL LOW (ref 4.0–10.5)
nRBC: 0.8 % — ABNORMAL HIGH (ref 0.0–0.2)

## 2020-06-20 LAB — HEPARIN INDUCED PLATELET AB (HIT ANTIBODY): Heparin Induced Plt Ab: 0.104 OD (ref 0.000–0.400)

## 2020-06-20 MED ORDER — SODIUM PHOSPHATES 45 MMOLE/15ML IV SOLN
45.0000 mmol | Freq: Once | INTRAVENOUS | Status: AC
  Administered 2020-06-20: 45 mmol via INTRAVENOUS
  Filled 2020-06-20: qty 15

## 2020-06-20 MED ORDER — THIAMINE HCL 100 MG PO TABS: 100.0000 mg | ORAL_TABLET | Freq: Every day | ORAL | Status: DC

## 2020-06-20 MED ORDER — IPRATROPIUM-ALBUTEROL 0.5-2.5 (3) MG/3ML IN SOLN
3.0000 mL | Freq: Four times a day (QID) | RESPIRATORY_TRACT | Status: DC
  Administered 2020-06-20 – 2020-06-21 (×4): 3 mL via RESPIRATORY_TRACT
  Filled 2020-06-20 (×4): qty 3

## 2020-06-20 MED ORDER — ALBUTEROL SULFATE (2.5 MG/3ML) 0.083% IN NEBU
2.5000 mg | INHALATION_SOLUTION | RESPIRATORY_TRACT | Status: DC | PRN
  Administered 2020-06-20: 2.5 mg via RESPIRATORY_TRACT
  Filled 2020-06-20: qty 3

## 2020-06-20 MED ORDER — K PHOS MONO-SOD PHOS DI & MONO 155-852-130 MG PO TABS
250.0000 mg | ORAL_TABLET | Freq: Once | ORAL | Status: AC
  Administered 2020-06-20: 250 mg via NASOGASTRIC
  Filled 2020-06-20: qty 1

## 2020-06-20 NOTE — Progress Notes (Signed)
[facility] Progress Note Patient Name: Gabriel Ramos DOB: 1951-05-10 MRN: [id number]   Date of Service  06/20/2020  HPI/Events of Note  Phosphorus 1.1, Creatinine 2.90  eICU Interventions  Phosphorus replaced per modified electrolyte replacement protocol.        [name] Aryaa Bunting 06/20/2020, 3:37 AM

## 2020-06-20 NOTE — Progress Notes (Signed)
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  I have reviewed prior notes from infectious disease, GI, cardiology.  Cardiology work-up has shown normal LVEF and normal right heart pressures.  He is maintained on torsemide and Aldactone.  Last creatinine 03/2020 was normal. For the last 2 weeks he has developed nausea and vomiting and was unable to take much orally.  He reports drinking Smirnoff lites but for the past month has changed to a shot of brandy every morning to prevent shakes.  For the past few days he was unable to take this.  Developed dyspnea which brought him to the emergency room found to have AKI and hypotension, blood pressure improved with 3 L of fluids and has not required pressors so far. Received empiric antibiotics for lactate of 5.8 with aztreonam/vancomycin/Flagyl. PCCM consulted for further management  Past Medical History:  Hep C/EtOH cirrhosis -no varices on last EGD, no overt hepatic encephalopathy Chronic hypoalbuminemia with pedal edema Normal LV function, normal right heart pressures EtOH use disorder Opiate use disorder, on Suboxone Asthma  Significant Hospital Events:  1/8 > Admission 1/12 low dose NE, UOP stable, Cr continues to improve 1/13 Worsening tachypnea, hypoxemia, had N/V overnight, CXR looks wet with possible aspiration pna, given IV lasix, pressors off, Cr continues to improve  Consults:  Nephrology  Procedures:  n/a  Significant Diagnostic Tests:  CT  head 1/7 >> No acute intracranial abnormality. Generalized atrophy and chronic small vessel ischemia. Chronic paranasal sinus disease. Bubbly debris within the right greater than left maxillary sinus may be acute on chronic sinus disease.  [place] abd 1/8 >> No acute findings. No gallstones. No evidence of acute cholecystitis. No ascites. Fatty infiltration of the liver.   Renal US 1/8 >> Technically challenging exam given the presence of extensive support devices and patient's or clinical status. Likely increased bilateral renal cortical echogenicity compatible with medical renal disease. Minimally complex cyst in the upper pole right kidney measuring 1.4cm, not significantly changed from comparison Imaging in 2018. No dedicated follow-up imaging is typically warranted. Additional simple appearing cyst is seen in the upper pole left kidney as well  Micro Data:  BCx 1/8 >> NGTD UCx 1/8 >> NGTD   Antimicrobials:  Aztreonam 1/8 >>1/10 Vancomycin 1/8>>1/10 Flagyl 1/8 Cefazolin 1/11>> 1/12  Interim History / Subjective:   Much weaker and less interactive this morning, received Dilaudid last night and also at 530 this morning Remains on 8 L/min Serum creatinine 2.3 to > 2.51 > 2.90 Sodium 147, Phos 1.1 (replaced yesterday) Pancytopenia   Objective   Blood pressure 101/67, pulse (!) 117, temperature 98.1 F (36.7 C), temperature source Axillary, resp. rate (!) 36, height 5\' 10"  (1.778 m), weight 61.6 kg, SpO2 92 %.        Intake/Output Summary (Last 24 hours) at 06/20/2020 0836 Last data filed at 06/20/2020 0600 Gross per 24 hour  Intake 1180 ml  Output 190 ml  Net 990 ml   Filed Weights   06/18/20 0500 06/19/20 0500 06/20/20 0500  Weight: 69  kg 69.7 kg 61.6 kg    Examination: General: Acute and chronically ill-appearing man, tachypneic HENT: NG tube in place, oropharynx very dry with some coating on the tongue Lungs: Tachypneic, bibasilar inspiratory crackles, 8  L/min Cardiovascular: Regular, tachycardic, distant, no murmur Abdomen: Nondistended, hypoactive bowel sounds Extremities: Bandages on lower extremities, foot drop boots in place Skin: Diffuse erythematous macular rash on chest and trunk Neuro: Awake, will track but is not answering questions, will not interact with me, significant decline compared with 1/14  Resolved Hospital Problem list   Anion gap metabolic acidosis  Assessment & Plan:  Mr. Westman is 70yo male w/ alcohol-related fatty liver disease, alcohol and opiate use disorder admitted 1/8 w/ septic shock from bilateral lower extremity cellulitis w/ AKI.  #Septic shock 2/2 BLE cellulitis #Question new right lower lobe infiltrate, HCAP -Antibiotics for cellulitis completed 1/12.  Consider restart antibiotics depending on overall clinical trend for possible evolving pneumonia -Midodrine 10 mg 3 times daily -Off pressors  #Acute Hypoxemic Respiratory Failure: In setting of N/V, at risk aspiration.  Bilateral base predominant infiltrates chest x-ray 1/13 and again 1/15 -Diuresis held 1/14 due to renal function -Wean oxygen as able, currently stable 8 L/min but more tachypneic  #Acute kidney injury Nephrology following, appreciate recs. Renal function improving. UOP adequate.  -Following BMP -Continue to hold off on any further diuresis pending renal stabilization -Strict I/O -Renal dose medications and avoid nephrotoxins  #EtOH fatty liver disease #Chronic pedal edema Patient has difficult social situation and home and appears to have poor insight into his chronic liver disease. He is open to discussion with Palliative. EGD in March 2021, follows with gastroenterology.  -Thiamine, folate ordered -Appreciate palliative care assistance, input -Zofran ordered as needed  #Dysphagia Patient mentions he has had trouble swallowing in the past, says he was told he has a "ring in his throat" that makes swallowing difficult. EGD  08/2019 revealed Schatzki ring at GE junction which was disrupted at the time of the procedure.  Esophagram 1/12, barium tablet would not pass through the GE junction -Appreciate SLP input, okay for full liquid diet and advance as tolerated and per patient preference -Continuing tube feeding via NG tube for now  #Thrombocytopenia, pancytopenia Etiology unclear.  DIC panel reassuring.  HITT Ab panel pending -Following CBC -Consider possible causes, sepsis.  Await H ITT antibody results, heparin was discontinued  #Asthma Continue w/ nebs PRN. -DuoNeb as needed  #Pain/chronic opiate use disorder on Suboxone -Poorly responsive this morning and question related to Dilaudid, oversedation.  Hold the Dilaudid for now  #Hypophosphatemia #Hypokalemia Persistent hypophosphatemia this morning 1/15, suspect related to refeeding -Aggressively replete phosphorus -Follow BMP, magnesium, phosphorus  Best practice (evaluated daily)  Diet: Full liquids Pain/Anxiety/Delirium protocol (if indicated): Dilaudid PRN VAP protocol (if indicated): N/A DVT prophylaxis: SubQ heparin GI prophylaxis: N/A Glucose control: N/A Mobility: Bedrest Disposition: ICU Family: Discussed status, prognosis, plans with son by phone 1/14  Goals of Care:  Last date of multidisciplinary goals of care discussion: Palliative has seen. Dr Lamonte Sakai discussed status, organ system injuries, prognosis with son Junius Roads on 1/15. Recommended full medical care but that we should defer CPR, MV. He understands rationale, will consider a change in code status. We agreed to talk further when he arrives at hospital today 1/15 Follow up goals of care discussion due: 1/22 Code Status: Full  Labs   CBC: Recent Labs  Lab 06/14/20 0128 06/16/20 0216 06/17/20 0151 06/19/20 0842 06/19/20 1615 06/20/20 0135  WBC 7.8  5.1 4.9 3.2*  --  2.6*  HGB 8.3* 7.9* 9.3* 7.2*  --  8.6*  HCT 22.6* 23.1* 27.0* 21.9*  --  25.3*  MCV 97.4 98.7 99.6 102.3*   --  101.2*  PLT 136* 77* 66* 28* 34* 44*    Basic Metabolic Panel: Recent Labs  Lab 06/16/20 0216 06/17/20 0151 06/17/20 1429 06/17/20 1745 06/18/20 0615 06/18/20 1639 06/19/20 0046 06/20/20 0135  NA 136 137  --   --  139  --  142 147*  K 3.3* 3.7  --   --  3.1*  --  3.6 3.9  CL 104 105  --   --  105  --  109 112*  CO2 22 19*  --   --  20*  --  20* 22  GLUCOSE 99 81  --   --  148*  --  109* 96  BUN 43* 42*  --   --  40*  --  50* 65*  CREATININE 3.30* 2.65*  --   --  2.32*  --  2.51* 2.90*  CALCIUM 7.5* 7.2*  --   --  7.3*  --  7.7* 7.8*  MG 1.7 1.4* 2.8* 2.7* 2.3 2.3  --  2.3  PHOS 1.9*  1.8* 1.1* 2.1* 3.5 2.4* 2.2* 2.0* 1.1*   GFR: Estimated Creatinine Clearance: 21.5 mL/min (A) (by C-G formula based on SCr of 2.9 mg/dL (H)). Recent Labs  Lab 06/13/20 0840 06/13/20 1128 06/16/20 0216 06/17/20 0151 06/19/20 0842 06/20/20 0135  WBC  --    < > 5.1 4.9 3.2* 2.6*  LATICACIDVEN 2.8*  --   --   --   --   --    < > = values in this interval not displayed.    Liver Function Tests: Recent Labs  Lab 06/15/20 0129 06/16/20 0216 06/17/20 0151 06/18/20 0615 06/19/20 0046  ALBUMIN 2.0* 1.7* 1.5* 1.4* 1.5*   Recent Labs  Lab 06/13/20 1128  LIPASE 16   No results for input(s): AMMONIA in the last 168 hours.  ABG    Component Value Date/Time   HCO3 12.1 (L) 06/13/2020 0154   TCO2 13 (L) 06/13/2020 0154   ACIDBASEDEF 12.0 (H) 06/13/2020 0154   O2SAT 83.0 06/13/2020 0154     Coagulation Profile: Recent Labs  Lab 06/19/20 1615  INR 1.7*    Cardiac Enzymes: Recent Labs  Lab 06/13/20 1128  CKTOTAL 208    HbA1C: No results found for: HGBA1C  CBG: Recent Labs  Lab 06/19/20 1132 06/19/20 1656 06/19/20 1932 06/20/20 0651 06/20/20 0730  GLUCAP 105* 96 93 101* 90      Critical care time:     CRITICAL CARE Performed by: Collene Gobble   Total critical care time: 35 minutes  Critical care time was exclusive of separately billable procedures  and treating other patients.  Critical care was necessary to treat or prevent imminent or life-threatening deterioration.  Critical care was time spent personally by me on the following activities:  development of treatment plan with patient and/or surrogate as well as nursing, discussions with consultants, evaluation of patient's response to treatment, examination of patient, obtaining history from patient or surrogate, ordering and performing treatments and interventions, ordering and review of laboratory studies, ordering and review of radiographic studies, pulse oximetry and re-evaluation of patient's condition.   Baltazar Apo, MD, PhD 06/20/2020, 8:36 AM  Pulmonary and Critical Care [phone] or if no answer [phone]

## 2020-06-20 NOTE — Progress Notes (Signed)
Patient ID: Gabriel Ramos, male   DOB: 1950-06-18, 70 y.o.   MRN: [id number]  Medical records reviewed and NP visited patient at the bedside as a follow up for palliative medicine needs and emotional support.  Patient appears weaker and  Lethargic.    He is dyspneic at rest unable to follow commands.  He does not have medical decision capacity at this time. Dr Lamonte Sakai spoke with son today regarding the seriousness of the patient's current condition.  I met Gabriel Ramos/son/main decision maker at bedside. He tells me he "can see the change" in his father's condition.  Education offered on the medical situation and questions and concerns addressed Education offered on patient's high risk for decompensation 2/2 to multiple co-morbidites   Recommendation for DNR/DNI status understanding evidenced based poor outcomes in similar hospitalized patient, as the cause of arrest is likely associated with advanced chronic illness rather than an easily reversible acute cardio-pulmonary event.  Plan of care: - DNR/DNI- documented - continue with current treatment plan, son is hopeful for improvement  -ongoing conversation and support, decisions moving forward dependant on patient outcomes  Discussed and updated Patty/SO by telephone    Discussed with family the importance of continued conversation with each other  and the medical providers regarding overall plan of care and treatment options,  ensuring decisions are within the context of the patients values and GOCs.  Discussed with Dr Brock Ra   PMT will continue to support holistically    Total time spent on the unit was 35 minutes  Greater than 50% of the time was spent in counseling and coordination of care  Wadie Lessen NP  Palliative Medicine Team Team Phone # [phone] Pager [phone]

## 2020-06-20 NOTE — Progress Notes (Signed)
Rectal 94.1 restarted bear hugger and warm blankets.

## 2020-06-21 ENCOUNTER — Inpatient Hospital Stay (HOSPITAL_COMMUNITY): Payer: Medicare HMO

## 2020-06-21 DIAGNOSIS — K7030 Alcoholic cirrhosis of liver without ascites: Secondary | ICD-10-CM | POA: Diagnosis not present

## 2020-06-21 DIAGNOSIS — L03119 Cellulitis of unspecified part of limb: Secondary | ICD-10-CM | POA: Diagnosis not present

## 2020-06-21 DIAGNOSIS — J90 Pleural effusion, not elsewhere classified: Secondary | ICD-10-CM | POA: Diagnosis not present

## 2020-06-21 DIAGNOSIS — J9690 Respiratory failure, unspecified, unspecified whether with hypoxia or hypercapnia: Secondary | ICD-10-CM | POA: Diagnosis not present

## 2020-06-21 DIAGNOSIS — A419 Sepsis, unspecified organism: Secondary | ICD-10-CM | POA: Diagnosis not present

## 2020-06-21 DIAGNOSIS — R6521 Severe sepsis with septic shock: Secondary | ICD-10-CM | POA: Diagnosis not present

## 2020-06-21 DIAGNOSIS — N179 Acute kidney failure, unspecified: Secondary | ICD-10-CM | POA: Diagnosis not present

## 2020-06-21 LAB — BASIC METABOLIC PANEL
Anion gap: 15 (ref 5–15)
BUN: 85 mg/dL — ABNORMAL HIGH (ref 8–23)
CO2: 19 mmol/L — ABNORMAL LOW (ref 22–32)
Calcium: 7.4 mg/dL — ABNORMAL LOW (ref 8.9–10.3)
Chloride: 114 mmol/L — ABNORMAL HIGH (ref 98–111)
Creatinine, Ser: 3.2 mg/dL — ABNORMAL HIGH (ref 0.61–1.24)
GFR, Estimated: 20 mL/min — ABNORMAL LOW (ref >60)
Glucose, Bld: 140 mg/dL — ABNORMAL HIGH (ref 70–99)
Potassium: 4.2 mmol/L (ref 3.5–5.1)
Sodium: 148 mmol/L — ABNORMAL HIGH (ref 135–145)

## 2020-06-21 LAB — CBC
HCT: 26.7 % — ABNORMAL LOW (ref 39.0–52.0)
Hemoglobin: 8.7 g/dL — ABNORMAL LOW (ref 13.0–17.0)
MCH: 34.4 pg — ABNORMAL HIGH (ref 26.0–34.0)
MCHC: 32.6 g/dL (ref 30.0–36.0)
MCV: 105.5 fL — ABNORMAL HIGH (ref 80.0–100.0)
Platelets: 56 10*3/uL — ABNORMAL LOW (ref 150–400)
RBC: 2.53 MIL/uL — ABNORMAL LOW (ref 4.22–5.81)
RDW: 17.8 % — ABNORMAL HIGH (ref 11.5–15.5)
WBC: 1.4 10*3/uL — CL (ref 4.0–10.5)
nRBC: 3.6 % — ABNORMAL HIGH (ref 0.0–0.2)

## 2020-06-21 LAB — GLUCOSE, CAPILLARY
Glucose-Capillary: 100 mg/dL — ABNORMAL HIGH (ref 70–99)
Glucose-Capillary: 122 mg/dL — ABNORMAL HIGH (ref 70–99)
Glucose-Capillary: 111 mg/dL — ABNORMAL HIGH (ref 70–99)
Glucose-Capillary: 139 mg/dL — ABNORMAL HIGH (ref 70–99)

## 2020-06-21 LAB — PHOSPHORUS: Phosphorus: 4 mg/dL (ref 2.5–4.6)

## 2020-06-21 LAB — MAGNESIUM: Magnesium: 2.2 mg/dL (ref 1.7–2.4)

## 2020-06-21 MED ORDER — HYDROMORPHONE HCL 1 MG/ML IJ SOLN
0.5000 mg | INTRAMUSCULAR | Status: DC | PRN
  Administered 2020-06-21: 0.5 mg via INTRAVENOUS
  Filled 2020-06-21: qty 0.5

## 2020-06-21 MED ORDER — SCOPOLAMINE 1 MG/3DAYS TD PT72
1.0000 | MEDICATED_PATCH | TRANSDERMAL | Status: DC
  Administered 2020-06-21: 1.5 mg via TRANSDERMAL
  Filled 2020-06-21: qty 1

## 2020-06-22 LAB — PATHOLOGIST SMEAR REVIEW

## 2020-06-23 DIAGNOSIS — F1190 Opioid use, unspecified, uncomplicated: Secondary | ICD-10-CM | POA: Diagnosis present

## 2020-06-23 DIAGNOSIS — K7200 Acute and subacute hepatic failure without coma: Secondary | ICD-10-CM | POA: Diagnosis present

## 2020-06-23 DIAGNOSIS — R1310 Dysphagia, unspecified: Secondary | ICD-10-CM

## 2020-06-23 DIAGNOSIS — Z7289 Other problems related to lifestyle: Secondary | ICD-10-CM

## 2020-06-23 DIAGNOSIS — D696 Thrombocytopenia, unspecified: Secondary | ICD-10-CM | POA: Diagnosis not present

## 2020-06-23 DIAGNOSIS — Z789 Other specified health status: Secondary | ICD-10-CM

## 2020-06-23 DIAGNOSIS — J9601 Acute respiratory failure with hypoxia: Secondary | ICD-10-CM | POA: Diagnosis not present

## 2020-06-23 DIAGNOSIS — D61818 Other pancytopenia: Secondary | ICD-10-CM | POA: Diagnosis not present

## 2020-07-07 NOTE — Progress Notes (Signed)
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  I have reviewed prior notes from infectious disease, GI, cardiology.  Cardiology work-up has shown normal LVEF and normal right heart pressures.  He is maintained on torsemide and Aldactone.  Last creatinine 03/2020 was normal. For the last 2 weeks he has developed nausea and vomiting and was unable to take much orally.  He reports drinking Smirnoff lites but for the past month has changed to a shot of brandy every morning to prevent shakes.  For the past few days he was unable to take this.  Developed dyspnea which brought him to the emergency room found to have AKI and hypotension, blood pressure improved with 3 L of fluids and has not required pressors so far. Received empiric antibiotics for lactate of 5.8 with aztreonam/vancomycin/Flagyl. PCCM consulted for further management  Past Medical History:  Hep C/EtOH cirrhosis -no varices on last EGD, no overt hepatic encephalopathy Chronic hypoalbuminemia with pedal edema Normal LV function, normal right heart pressures EtOH use disorder Opiate use disorder, on Suboxone Asthma  Significant Hospital Events:  1/8 > Admission 1/12 low dose NE, UOP stable, Cr continues to improve 1/13 Worsening tachypnea, hypoxemia, had N/V overnight, CXR looks wet with possible aspiration pna, given IV lasix, pressors off, Cr continues to improve  Consults:  Nephrology  Procedures:  n/a  Significant Diagnostic Tests:  CT  head 1/7 >> No acute intracranial abnormality. Generalized atrophy and chronic small vessel ischemia. Chronic paranasal sinus disease. Bubbly debris within the right greater than left maxillary sinus may be acute on chronic sinus disease.  [place] abd 1/8 >> No acute findings. No gallstones. No evidence of acute cholecystitis. No ascites. Fatty infiltration of the liver.   Renal US 1/8 >> Technically challenging exam given the presence of extensive support devices and patient's or clinical status. Likely increased bilateral renal cortical echogenicity compatible with medical renal disease. Minimally complex cyst in the upper pole right kidney measuring 1.4cm, not significantly changed from comparison Imaging in 2018. No dedicated follow-up imaging is typically warranted. Additional simple appearing cyst is seen in the upper pole left kidney as well  Micro Data:  BCx 1/8 >> NGTD UCx 1/8 >> NGTD   Antimicrobials:  Aztreonam 1/8 >>1/10 Vancomycin 1/8>>1/10 Flagyl 1/8 Cefazolin 1/11>> 1/12  Interim History / Subjective:   Overall decline over the last 24 hours with apneic respiratory pattern Poorly responsive, more encephalopathic, unable to arouse Progressive renal failure, pancytopenia    Objective   Blood pressure (!) 78/58, pulse (!) 122, temperature 98.7 F (37.1 C), temperature source Axillary, resp. rate (!) 37, height 5\' 10"  (1.778 m), weight 67.3 kg, SpO2 96 %.        Intake/Output Summary (Last 24 hours) at 10-Jul-2020 0942 Last data filed at July 10, 2020 0600 Gross per 24 hour  Intake 1535 ml  Output 60 ml  Net 1475 ml   Filed Weights   06/19/20 0500 06/20/20 0500 2020-07-10 0500  Weight: 69.7 kg 61.6 kg 67.3 kg    Examination: General: Acutely ill  gentleman, agonal respiratory pattern HENT: Oropharynx dry Lungs: Agonal respirations, coarse bilaterally Cardiovascular: Regular, tachycardic, distant Abdomen: Nondistended, hypoactive bowel sounds Extremities: Bandages on  lower extremities, foot drop boots Skin: Erythematous macular rash on trunk Neuro: Positive voice or pain, completely obtunded  Resolved Hospital Problem list   Anion gap metabolic acidosis  Assessment & Plan:  [name] is 70yo male w/ alcohol-related fatty liver disease, alcohol and opiate use disorder admitted 1/8 w/ septic shock from bilateral lower extremity cellulitis w/ AKI.  #Septic shock 2/2 BLE cellulitis #Question new right lower lobe infiltrate, HCAP -Completed antibiotics for cellulitis 1/12 - off pressors, unable to give any po meds at this time including midodrine   #Acute Hypoxemic Respiratory Failure:  Bilateral base predominant infiltrates chest x-ray 1/13 and again 1/15. Now obtunded with inadequate airway protection. -Have discussed overall status, goals of care with patient's son.  It is clear to me that he would not survive intubation or mechanical ventilation to a meaningful recovery.  For this reason we have decided to defer.  DNR/I status confirmed -Anticipate transition to comfort as he is progressively worse despite our medical interventions.  #Acute kidney injury Nephrology following, appreciate recs. Renal function improving. UOP adequate.  -Hold off further diuresis at this time -Renal dose medications and avoid nephrotoxins  #EtOH fatty liver disease #Chronic pedal edema Patient has difficult social situation and home and appears to have poor insight into his chronic liver disease. He is open to discussion with Palliative. EGD in March 2021, follows with gastroenterology.  -Has been on thiamine, folate.  Plan to likely transition off any noncomfort based medications -Appreciate palliative care assistance and support of patient's family  #Dysphagia Patient mentions he has had trouble swallowing in the past, says he was told he has a "ring in his throat" that makes swallowing difficult. EGD 08/2019 revealed Schatzki ring at GE junction which was disrupted  at the time of the procedure.  Esophagram 1/12, barium tablet would not pass through the GE junction -N.p.o. given his progressive obtundation  #Thrombocytopenia, pancytopenia Etiology unclear.  DIC panel reassuring.  HITT Ab panel pending -Likely due to sepsis.  HIT antibody still pending.  Platelets have rebounded some, remains leukopenic  #Asthma Continue w/ nebs PRN. -DuoNeb as needed  #Pain/chronic opiate use disorder on Suboxone -Okay to continue to use Dilaudid if any evidence of pain.  Initially held this to avoid encephalopathy but at this point anticipate an overall transition to more comfort based approach  #Hypophosphatemia #Hypokalemia -Phosphorus replaced  Best practice (evaluated daily)  Diet: Full liquids Pain/Anxiety/Delirium protocol (if indicated): Dilaudid PRN VAP protocol (if indicated): N/A DVT prophylaxis: SubQ heparin GI prophylaxis: N/A Glucose control: N/A Mobility: Bedrest Disposition: ICU Family: Discussed status, prognosis, plans with son by phone 1/14, 115, 116  Goals of Care:  Last date of multidisciplinary goals of care discussion: Discussions 1/15 with Dr. Lamonte Sakai, with palliative care consultants. Follow up goals of care discussion due: 1/22 Code Status: DNR/I  Labs   CBC: Recent Labs  Lab 06/16/20 0216 06/17/20 0151 06/19/20 0842 06/19/20 1615 06/20/20 0135 07/16/20 0143  WBC 5.1 4.9 3.2*  --  2.6* 1.4*  HGB 7.9* 9.3* 7.2*  --  8.6* 8.7*  HCT 23.1* 27.0* 21.9*  --  25.3* 26.7*  MCV 98.7 99.6 102.3*  --  101.2* 105.5*  PLT 77* 66* 28* 34* 44* 56*    Basic Metabolic Panel: Recent Labs  Lab 06/17/20 0151 06/17/20 1429 06/17/20 1745 06/18/20 0615 06/18/20 1639  06/19/20 0046 06/20/20 0135 06/20/20 1938 2020/07/08 0143  NA 137  --   --  139  --  142 147*  --  148*  K 3.7  --   --  3.1*  --  3.6 3.9  --  4.2  CL 105  --   --  105  --  109 112*  --  114*  CO2 19*  --   --  20*  --  20* 22  --  19*  GLUCOSE 81  --   --  148*  --   109* 96  --  140*  BUN 42*  --   --  40*  --  50* 65*  --  85*  CREATININE 2.65*  --   --  2.32*  --  2.51* 2.90*  --  3.20*  CALCIUM 7.2*  --   --  7.3*  --  7.7* 7.8*  --  7.4*  MG 1.4*   < > 2.7* 2.3 2.3  --  2.3  --  2.2  PHOS 1.1*   < > 3.5 2.4* 2.2* 2.0* 1.1* 4.2 4.0   < > = values in this interval not displayed.   GFR: Estimated Creatinine Clearance: 21.3 mL/min (A) (by C-G formula based on SCr of 3.2 mg/dL (H)). Recent Labs  Lab 06/17/20 0151 06/19/20 0842 06/20/20 0135 July 08, 2020 0143  WBC 4.9 3.2* 2.6* 1.4*    Liver Function Tests: Recent Labs  Lab 06/15/20 0129 06/16/20 0216 06/17/20 0151 06/18/20 0615 06/19/20 0046  ALBUMIN 2.0* 1.7* 1.5* 1.4* 1.5*   No results for input(s): LIPASE, AMYLASE in the last 168 hours. No results for input(s): AMMONIA in the last 168 hours.  ABG    Component Value Date/Time   HCO3 12.1 (L) 06/13/2020 0154   TCO2 13 (L) 06/13/2020 0154   ACIDBASEDEF 12.0 (H) 06/13/2020 0154   O2SAT 83.0 06/13/2020 0154     Coagulation Profile: Recent Labs  Lab 06/19/20 1615  INR 1.7*    Cardiac Enzymes: No results for input(s): CKTOTAL, CKMB, CKMBINDEX, TROPONINI in the last 168 hours.  HbA1C: No results found for: HGBA1C  CBG: Recent Labs  Lab 06/20/20 1544 06/20/20 1953 06/20/20 2326 2020-07-08 0354 Jul 08, 2020 0800  GLUCAP 106* 107* 124* 100* 122*      Critical care time: 33    CRITICAL CARE Performed by: Collene Gobble   Total critical care time: 33 minutes  Critical care time was exclusive of separately billable procedures and treating other patients.  Critical care was necessary to treat or prevent imminent or life-threatening deterioration.  Critical care was time spent personally by me on the following activities:  development of treatment plan with patient and/or surrogate as well as nursing, discussions with consultants, evaluation of patient's response to treatment, examination of patient, obtaining history  from patient or surrogate, ordering and performing treatments and interventions, ordering and review of laboratory studies, ordering and review of radiographic studies, pulse oximetry and re-evaluation of patient's condition.   Baltazar Apo, MD, PhD Jul 08, 2020, 9:42 AM  Pulmonary and Critical Care [phone] or if no answer [phone]

## 2020-07-07 NOTE — Progress Notes (Addendum)
[facility] Progress Note Patient Name: Gabriel Ramos DOB: 05-12-1951 MRN: [id number]   Date of Service  2020-06-27  HPI/Events of Note  RN reporting WBC 1.4, down from 2.6 & 3.2 before that.   eICU Interventions  Continue care. HIT panel pending. Improving thrombocytopenia, stable HG. No schistocytes . Cirrhosis hx also.  On abx for sepsis/cellulitis.     Intervention Category Intermediate Interventions: Diagnostic test evaluation  Elmer Sow 27-Jun-2020, 3:07 AM

## 2020-07-07 NOTE — Progress Notes (Signed)
PCCM Note  Notified that patient was asystole on telemetry, no breath sounds or heart sounds on RN exam, my exam.  TOD declared 17:21, 06/22/2020.   Baltazar Apo, MD, PhD 2020/06/22, 5:30 PM [facility] [phone] or if no answer [phone]

## 2020-07-07 NOTE — Progress Notes (Signed)
Patient son alarm RN patient wasn't breathing, RN April Singleton to bedside and verified no heart sound or lung sound x1 minute. Also This RN listen for 1 minute and no breath or heart sound heard. MD to beside spoke to son.  Son took all patient's belonging located in the room.per telemetry monitor pt HR went asystole at 1719.

## 2020-07-07 DEATH — deceased

## 2020-07-27 ENCOUNTER — Ambulatory Visit: Payer: Medicare HMO | Admitting: Dermatology

## 2020-08-04 NOTE — Death Summary Note (Signed)
DEATH SUMMARY   Patient Details  Name: Gabriel Ramos MRN: [id number] DOB: 03-07-1951  Admission/Discharge Information   Admit Date:  06-29-2020  Date of Death: Date of Death: 2020/07/08  Time of Death: Time of Death: 09-29-1717  Length of Stay: 8  Referring Physician: Hulan Fess, MD   Reason(s) for Hospitalization  Cellulitis, hypotension  Diagnoses  Preliminary cause of death:   Septic Shock  Secondary Diagnoses (including complications and co-morbidities):  Principal Problem:   Septic shock ([place]) Active Problems:   Asthma, chronic, severe persistent, uncomplicated   Pressure injury of skin   Acute renal failure ([place])   Cirrhosis of liver without ascites ([place])   Hypotension   Cellulitis of lower extremity   Protein-calorie malnutrition, severe   Thrombocytopenia ([place])   Pancytopenia ([place])   Shock liver   Acute respiratory failure with hypoxia ([place])   Dysphagia   Alcohol use   Opiate use   Brief Hospital Course (including significant findings, care, treatment, and services provided and events leading to death)  Gabriel Ramos is a 70 y.o. 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 admitted 2020-06-29 with bilateral lower extremity cellulitis, hypotension, nausea and vomiting for 2 weeks.  In the emergency department he had hypotension, and elevated lactate of 5.8 consistent with septic shock.  Also with new acute renal failure.  He responded initially to volume resuscitation.  Broad-spectrum antibiotics initiated.  He required low-dose pressors.  He has some initial improvement in his kidney function unfortunately developed progressive encephalopathy, likely toxic metabolic in nature, as well as acute respiratory distress and then respiratory failure.  Chest x-ray showed a new right lower lobe infiltrate consistent with HCAP.  He did have history of dysphagia that would may have contributed to pneumonia or aspiration.  He developed  pancytopenia especially thrombocytopenia likely due to sepsis and septic shock.  As his mental status and respiratory failure worsened discussions were undertaken with the patient's son regarding his status, prognosis.  Unfortunately it was felt that he would not survive intubation or mechanical ventilation should this become necessary.  Patient's son understood.  Decision was made to continue medical care but to defer intubation or mechanical ventilation, CPR should he decline.  Unfortunately he did continue to decline despite all aggressive medical care.  He was transition to comfort measures on 07/08/2020.  He expired later the same day.    Pertinent Labs and Studies  Significant Diagnostic Studies CT HEAD WO CONTRAST  Result Date: 06/13/2020 CLINICAL DATA:  Altered mental status.  Lethargy. EXAM: CT HEAD WITHOUT CONTRAST TECHNIQUE: Contiguous axial images were obtained from the base of the skull through the vertex without intravenous contrast. COMPARISON:  Head CT 07/09/15 FINDINGS: Brain: Generalized atrophy is slightly advanced for age. Mild periventricular and deep white matter hypodensity typical of chronic small vessel ischemia. No intracranial hemorrhage, mass effect, or midline shift. No hydrocephalus. The basilar cisterns are patent. No evidence of territorial infarct or acute ischemia. No extra-axial or intracranial fluid collection. Vascular: Atherosclerosis of skullbase vasculature without hyperdense vessel or abnormal calcification. Skull: No fracture or focal lesion. Sinuses/Orbits: Chronic paranasal sinus disease with postsurgical change. Scattered areas of mucosal thickening as well as heterogeneous debris in the right greater than left maxillary sinus. Minimal opacification of lower right mastoid air cells. Other: None. IMPRESSION: 1. No acute intracranial abnormality. 2. Generalized atrophy and chronic small vessel ischemia. 3. Chronic paranasal sinus disease. Bubbly debris within the  right greater  than left maxillary sinus may be acute on chronic sinus disease. Electronically Signed   By: Keith Rake M.D.   On: 06/13/2020 00:06   US Renal  Result Date: 06/13/2020 CLINICAL DATA:  Acute renal failure EXAM: RENAL / URINARY TRACT ULTRASOUND COMPLETE COMPARISON:  CT 12/03/2019, ultrasound 11/30/2016 FINDINGS: Right Kidney: Renal measurements: 8.6 x 5.6 x 5.0 cm = volume: 127.1 mL. Renal cortical echogenicity may be mildly increased though difficult to completely assess given what is likely a degree of up attic steatosis as well. There is a 1.1 x 1.2 x 1.4 cm predominantly anechoic, cystic appearing lesion with increased posterior through transmission. A single thin internal septation is noted within this structure. No concerning internal vascularity or vascular mural nodularity. No other focal renal lesions. No hydronephrosis or shadowing calculus. Left Kidney: Renal measurements: 9.0 x 5.0 x 4.2 cm = volume: 90.6 mL. Slightly increased renal cortical echogenicity. Simple appearing anechoic 1.6 x 1.4 x 1.2 cm cyst in the upper pole with increased posterior through transmission. No concerning internal complexity. No other concerning renal lesions. No hydronephrosis or shadowing calculus. Bladder: Appears normal for degree of bladder distention. Other: Diffuse hepatic hypoattenuation compatible with hepatic steatosis. IMPRESSION: Technically challenging exam given the presence of extensive support devices and patient's or clinical status. Likely increased bilateral renal cortical echogenicity compatible with medical renal disease. Minimally complex cyst in the upper pole right kidney measuring 1.4 cm, not significantly changed from comparison Imaging in 2018. No dedicated follow-up imaging is typically warranted. Additional simple appearing cyst is seen in the upper pole left kidney as well. Incidental note made of hepatic steatosis. Electronically Signed   By: Lovena Le M.D.   On:  06/13/2020 06:46   DG Chest Port 1 View  Result Date: 06/26/2020 CLINICAL DATA:  70 year old male acute on chronic respiratory failure. EXAM: PORTABLE CHEST 1 VIEW COMPARISON:  Portable chest 06/20/2020 and earlier. FINDINGS: Portable AP semi upright view at 0500 hours. Stable visible feeding tube. Stable lung volumes. Mediastinal contours stable but partially obscured by extensive bilateral reticulonodular opacity, relatively sparing the right upper lung. Additional veiling opacity at the left lung base. No pneumothorax. Ventilation stable from yesterday. Negative visible bowel gas pattern. IMPRESSION: 1. Stable ventilation from yesterday with extensive bilateral pulmonary opacity, relatively sparing the right upper lung. 2. Superimposed left pleural effusion remains possible. 3. Stable feeding tube. Electronically Signed   By: Genevie Ann M.D.   On: 06/27/2020 07:34   DG Chest Port 1 View  Result Date: 06/20/2020 CLINICAL DATA:  70 year old male acute on chronic respiratory failure with hypoxia. EXAM: PORTABLE CHEST 1 VIEW COMPARISON:  Portable chest 06/18/2020 and earlier. FINDINGS: Portable AP semi upright view at 0506 hours. Enteric feeding tube remains in place coursing to the abdomen. Stable lung volumes and mediastinal contours. No definite cardiomegaly. Diffuse pulmonary reticulonodular opacity now, with less pronounced airspace appearing opacity at both lung bases. Right upper lung most spared as before. No pneumothorax. Small left pleural effusion difficult to exclude. IMPRESSION: Evolution from basilar predominant airspace opacity to more diffuse bilateral reticulonodular interstitial opacity since 06/18/2020. Unclear whether this represents diffuse infection or pulmonary edema. Right upper lung least affected as before. Small left pleural effusion is possible. Electronically Signed   By: Genevie Ann M.D.   On: 06/20/2020 08:18   DG Chest Port 1 View  Result Date: 06/18/2020 CLINICAL DATA:  Hypoxia  EXAM: PORTABLE CHEST 1 VIEW COMPARISON:  Esophagram 06/17/2020, radiograph 06-18-2020, CT 10/22/2015 FINDINGS: Mixed heterogeneous  interstitial and airspace opacities which are most coalescent in the retrocardiac space and right infrahilar lung. Some mild vascular congestion with fissural and septal thickening is noted as well. Trace right and small left pleural effusions are seen layering in the bases. No pneumothorax. Prominent cardiac silhouette, likely accentuated by low volumes and portable technique. Remaining cardiomediastinal contours are unremarkable. Transesophageal tube tip terminates near the region of the gastric antrum/duodenal bulb. High attenuation contrast media is seen partially opacifying the colon with larger bolus in the splenic flexure. IMPRESSION: 1. Mixed heterogeneous interstitial and airspace opacities which are most coalescent in the retrocardiac space and right infrahilar lung. Findings could reflect infection, sequela of aspiration, pulmonary edema or some combination there of. 2. Trace right and small left pleural effusions. 3. Transesophageal tube tip near the duodenal bulb/gastric antrum. Electronically Signed   By: Lovena Le M.D.   On: 06/18/2020 06:15   DG Chest Portable 1 View  Result Date: 06/14/2020 CLINICAL DATA:  Lethargy, bradycardia EXAM: PORTABLE CHEST 1 VIEW COMPARISON:  Radiograph 10/17/2019 FINDINGS: Small stable left midlung granuloma. Mild bronchitic changes are similar to comparison images. No consolidation, features of edema, pneumothorax, or effusion. Pulmonary vascularity is normally distributed. The cardiomediastinal contours are unremarkable. No acute osseous or soft tissue abnormality. Telemetry leads overlie the chest. IMPRESSION: No acute cardiopulmonary abnormality. Electronically Signed   By: Lovena Le M.D.   On: 07/01/2020 23:57   US Abdomen Limited RUQ (LIVER/GB)  Result Date: 06/13/2020 CLINICAL DATA:  Cirrhosis of liver. EXAM: ULTRASOUND  ABDOMEN LIMITED RIGHT UPPER QUADRANT COMPARISON:  None. FINDINGS: Gallbladder: No gallstones or wall thickening visualized. No sonographic Murphy sign noted by sonographer. Common bile duct: Diameter: Not seen Liver: Liver is diffusely echogenic indicating fatty infiltration. No focal mass or lesion is seen within the liver. Portal vein is patent on color Doppler imaging with normal direction of blood flow towards the liver. Other: All 4 quadrants evaluated.  No free fluid is identified. IMPRESSION: 1. No acute findings. No gallstones. No evidence of acute cholecystitis. No ascites. 2. Fatty infiltration of the liver. 3. Common bile duct is not seen, presumably nondistended. Electronically Signed   By: Franki Cabot M.D.   On: 06/13/2020 10:33   DG ESOPHAGUS W SINGLE CM (SOL OR THIN BA)  Result Date: 06/17/2020 CLINICAL DATA:  Difficulty swallowing food. EXAM: ESOPHOGRAM/BARIUM SWALLOW TECHNIQUE: Single contrast examination was performed using  thin barium. FLUOROSCOPY TIME:  Fluoroscopy Time:  2 minutes 18 seconds Radiation Exposure Index (if provided by the fluoroscopic device): Number of Acquired Spot Images: 0 COMPARISON:  None. FINDINGS: Limited examination was performed in the recumbent LPO position. 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 A 13 mm barium tablet would not pass beyond the gastroesophageal junction, which may be slightly narrowed. IMPRESSION: A 13 mm barium tablet would not pass through the gastroesophageal junction, likely due to a combination of slight narrowing and dysmotility. Electronically Signed   By: Lorin Picket M.D.   On: 06/17/2020 08:42     Collene Gobble 07/08/2020, 12:58 PM

## 2020-10-27 IMAGING — CT CT ABD-PELV W/ CM
2 of 9 series · 12 of 46 positions shown, 18 images · IV contrast (OMNIPAQUE)
Comparison: None.

CLINICAL DATA: Chronic nausea for approximately 1 year. Lower
extremity edema. Cirrhosis.

EXAM:
CT ABDOMEN AND PELVIS WITH CONTRAST
TECHNIQUE: Multidetector CT imaging of the abdomen and pelvis was performed
using the standard protocol following bolus administration of
intravenous contrast.
CONTRAST:  100mL OMNIPAQUE IOHEXOL 300 MG/ML  SOLN

[Series 3: coronal arterial · coronal · arterial · 0.77mm/px · 3 of 76 slices shown]
[im 19/76  soft-tissue]
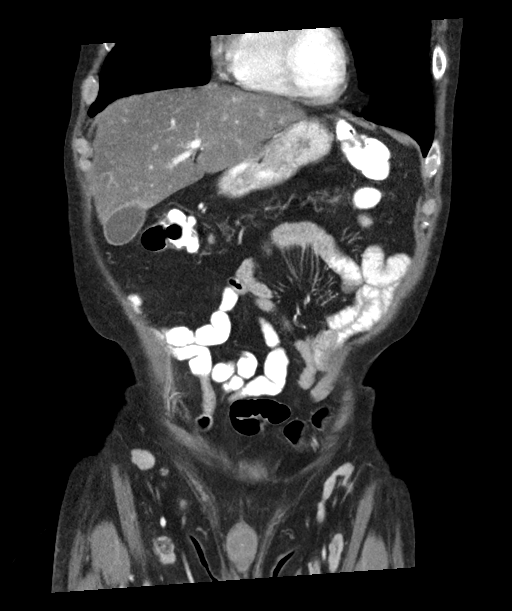
[im 38/76  soft-tissue]
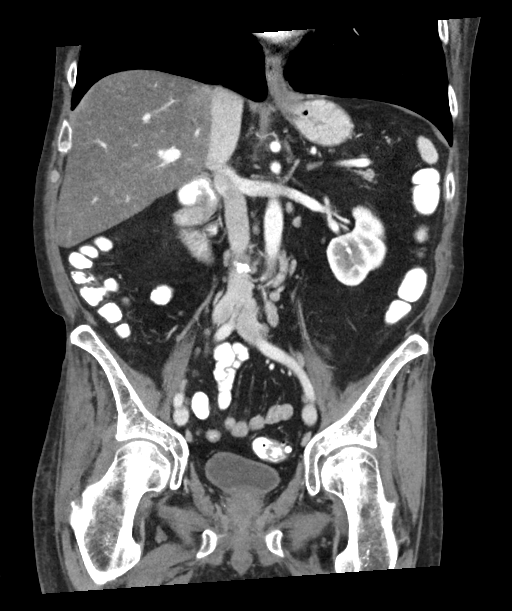
[im 57/76  soft-tissue]
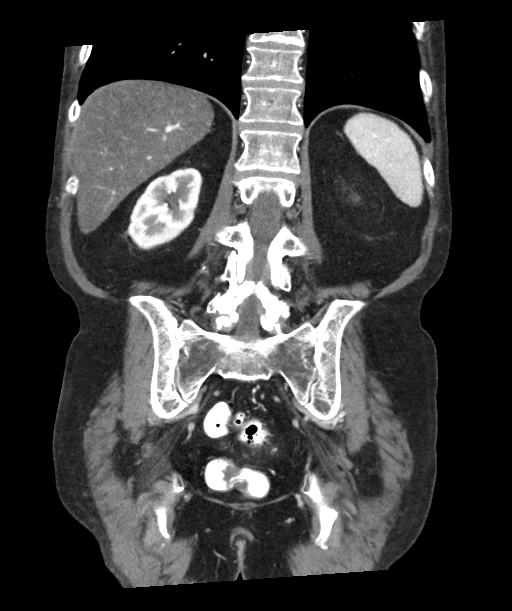

[Series 7: axial venous · axial · portal-venous · 0.78mm/px · z∈[-518,-182]mm · 9 of 141 slices shown, 15 images]
[im 15/141  soft-tissue]
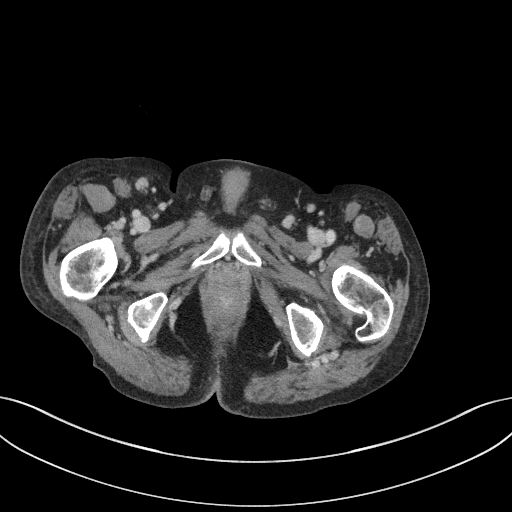
[im 15/141  bone]
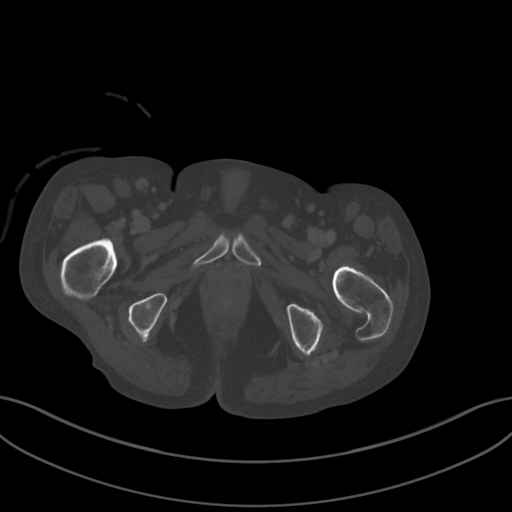
[im 29/141  soft-tissue]
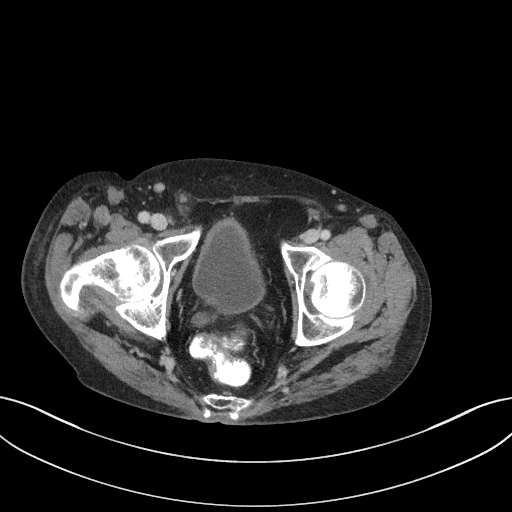
[im 43/141  soft-tissue]
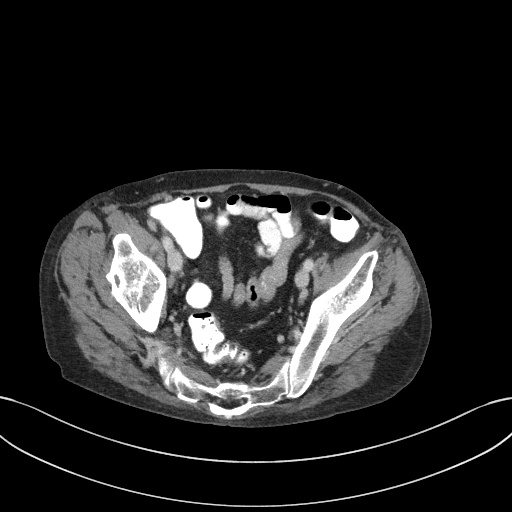
[im 57/141  soft-tissue]
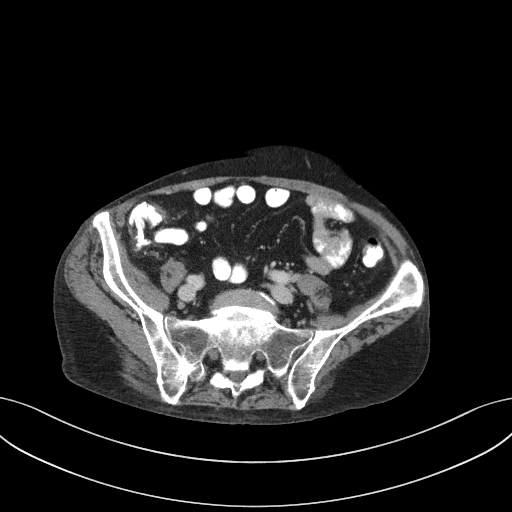
[im 71/141  soft-tissue]
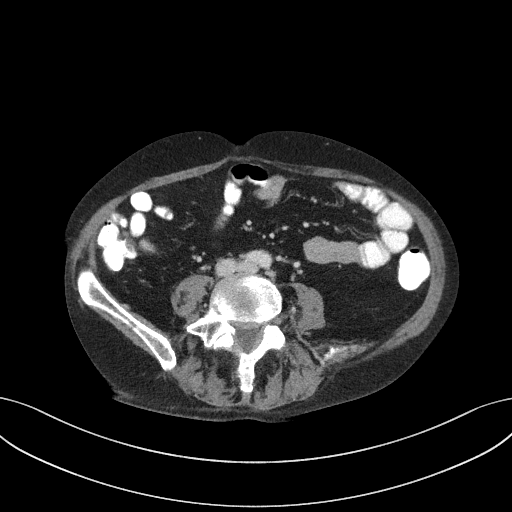
[im 85/141  soft-tissue]
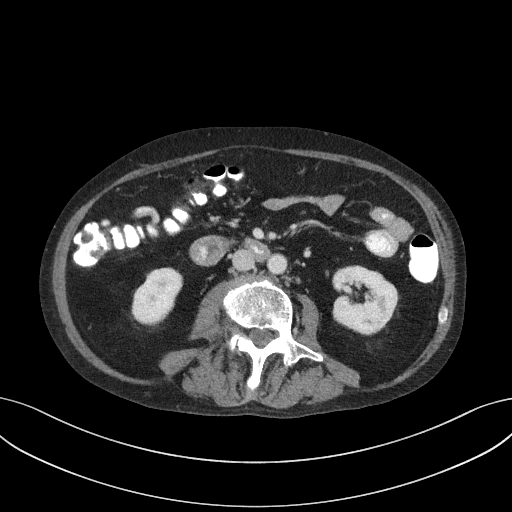
[im 85/141  lung]
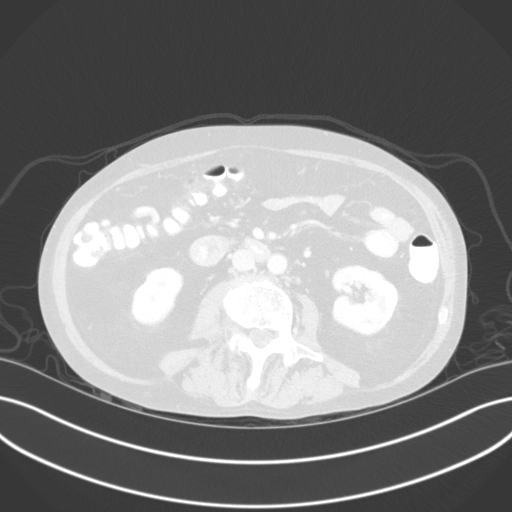
[im 99/141  soft-tissue]
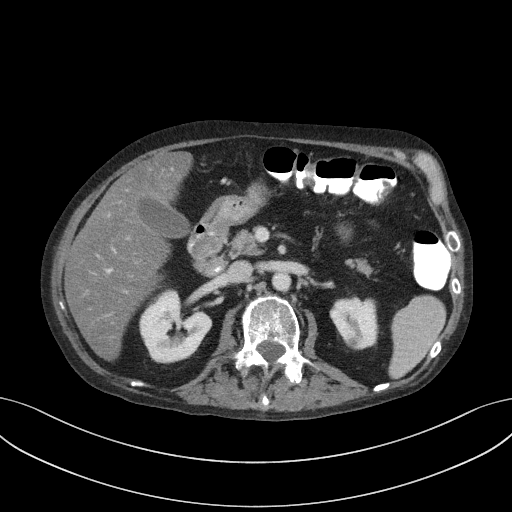
[im 99/141  lung]
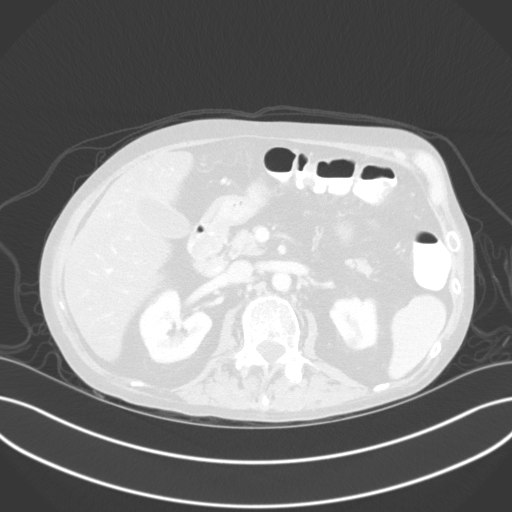
[im 113/141  soft-tissue]
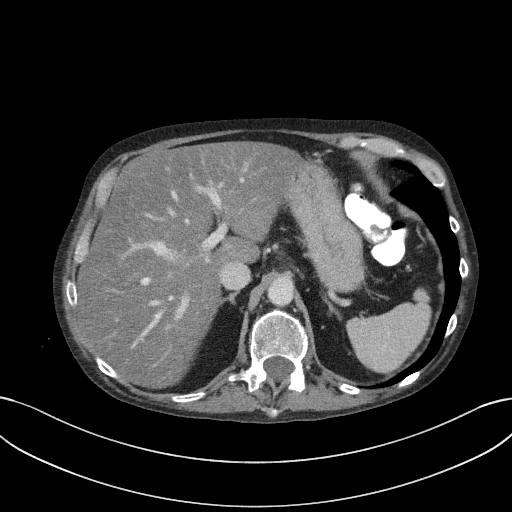
[im 113/141  lung]
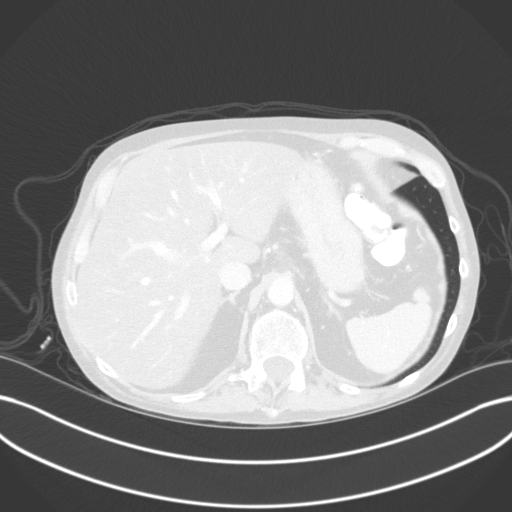
[im 127/141  soft-tissue]
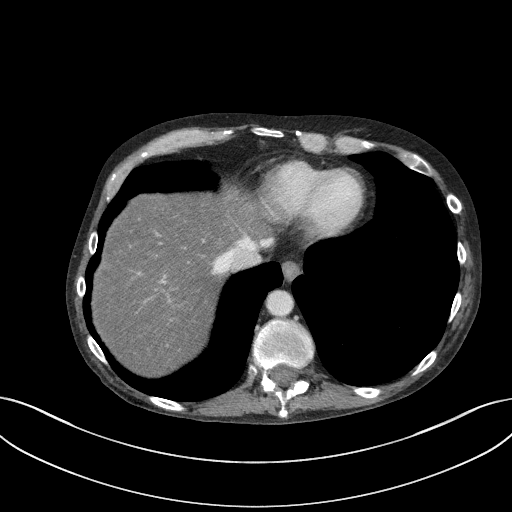
[im 127/141  lung]
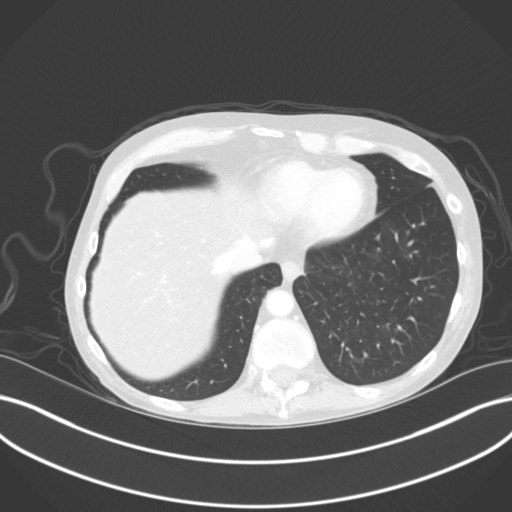
[im 127/141  bone]
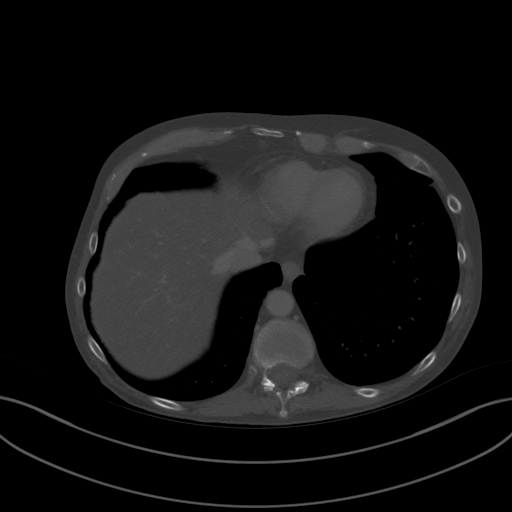

[12 of 46 positions shown; findings below may reference images not displayed]

FINDINGS: Lower Chest: No acute findings.

Hepatobiliary: No hepatic masses identified. Severe diffuse hepatic
steatosis is demonstrated. Gallbladder is unremarkable. No evidence
of biliary ductal dilatation.

Pancreas: No mass or inflammatory changes. Several scattered
pancreatic calcifications are noted, suspicious for chronic
pancreatitis.

Spleen: Within normal limits in size and appearance.

Adrenals/Urinary Tract: No masses identified. A few small renal
cysts are noted bilaterally. No evidence of ureteral calculi or
hydronephrosis.

Stomach/Bowel: No evidence of obstruction, inflammatory process or
abnormal fluid collections. Normal appendix visualized. Mild sigmoid
diverticulosis is noted, however there is no evidence of
diverticulitis.

Vascular/Lymphatic: No pathologically enlarged lymph nodes. No
abdominal aortic aneurysm.

Reproductive:  No mass or other significant abnormality.

Other:  No evidence of ascites.

Musculoskeletal:  No suspicious bone lesions identified.
IMPRESSION: 1. No acute findings.
2. Severe hepatic steatosis.
3. Several pancreatic calcifications, suspicious for chronic
pancreatitis
4. Mild sigmoid diverticulosis. No radiographic evidence of
diverticulitis.

## 2021-05-08 IMAGING — US US ABDOMEN LIMITED
1 series · 14 of 25 positions shown · non-contrast
Comparison: None.

CLINICAL DATA: Cirrhosis of liver.

EXAM:
ULTRASOUND ABDOMEN LIMITED RIGHT UPPER QUADRANT

[Series 1: us abdomen limited ruq (liver/gb) · 14 of 42 slices shown]
[im 1/42]
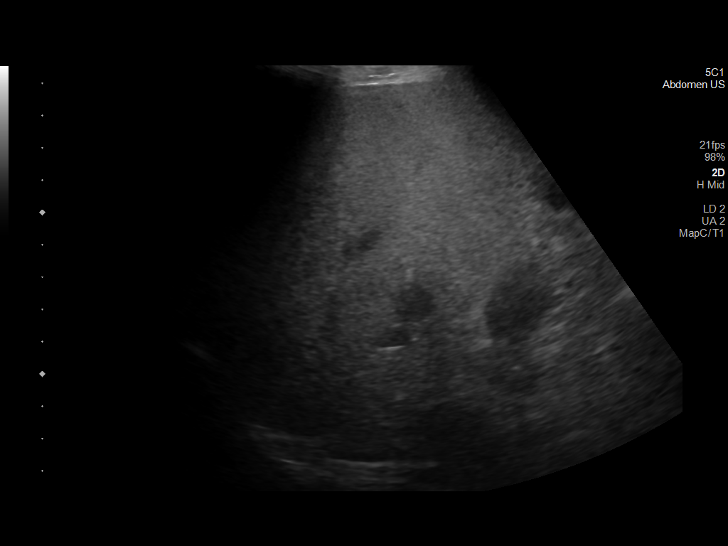
[im 4/42]
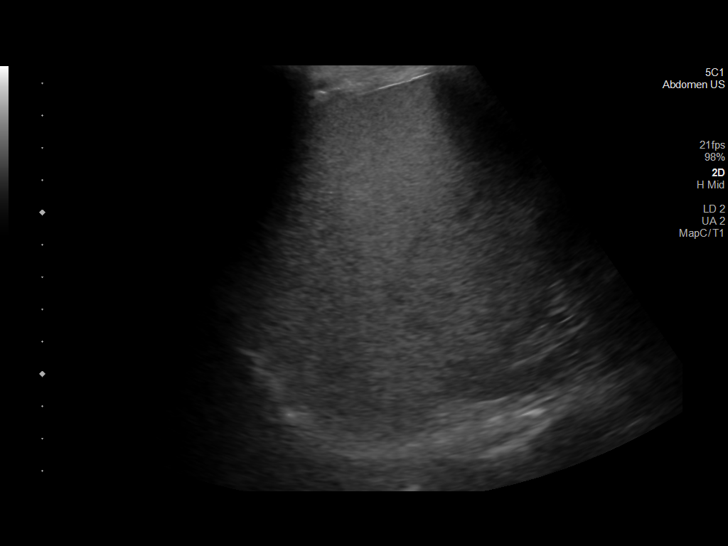
[im 7/42]
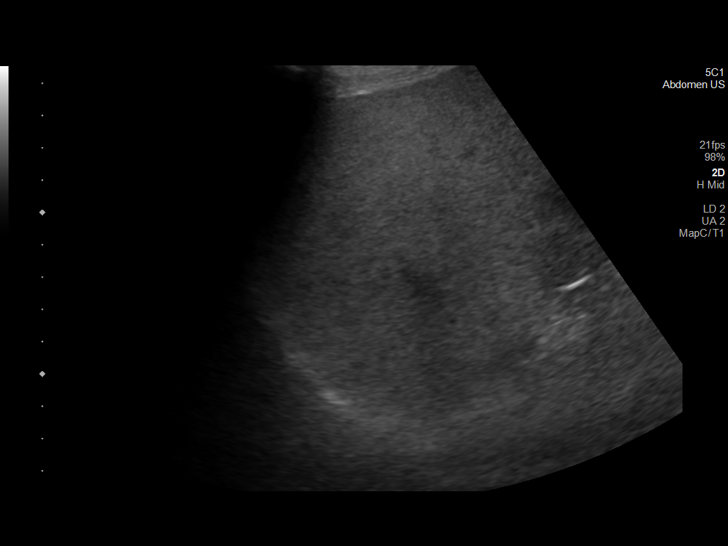
[im 11/42]
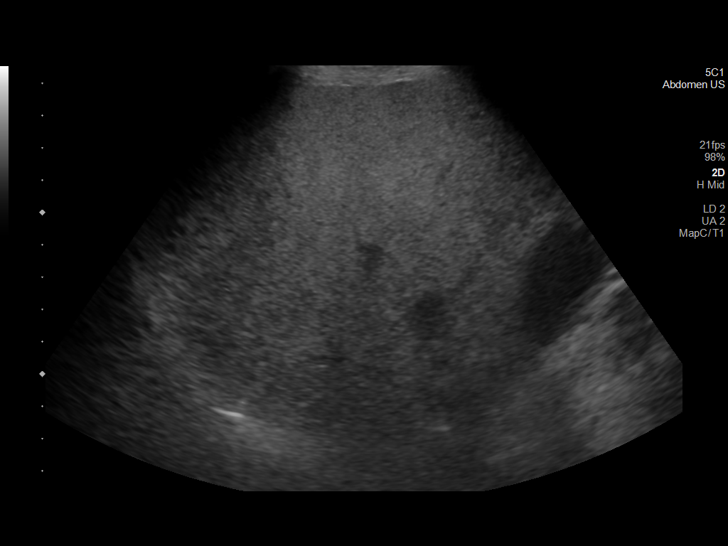
[im 14/42]
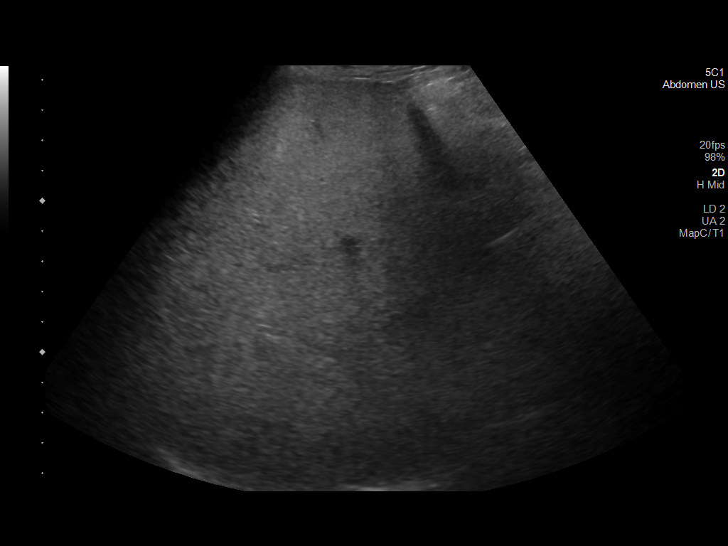
[im 16/42]
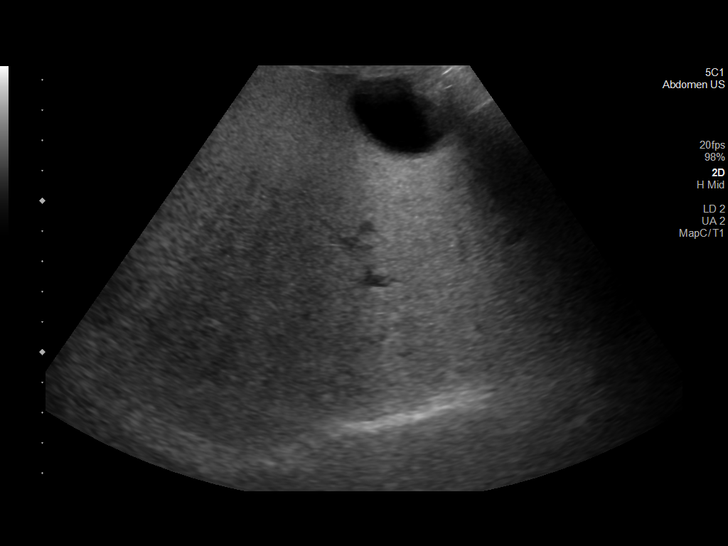
[im 19/42]
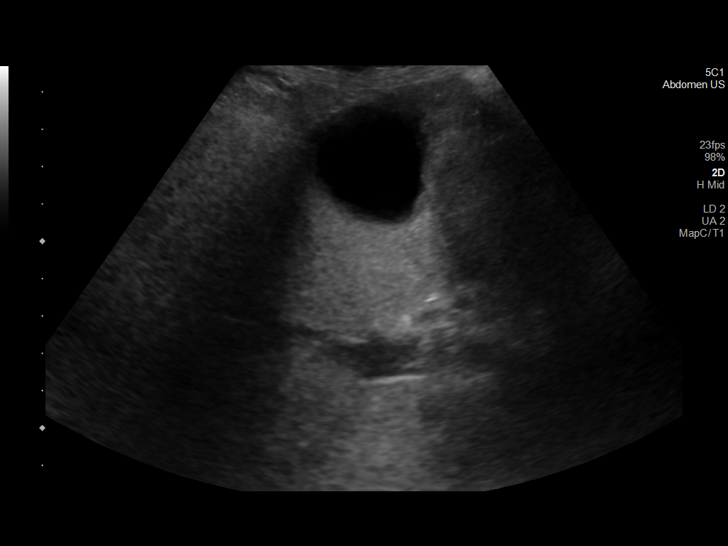
[im 23/42]
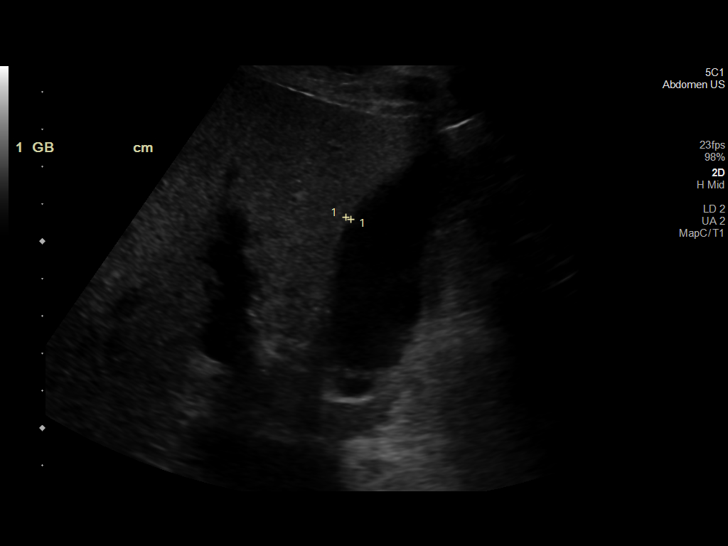
[im 26/42]
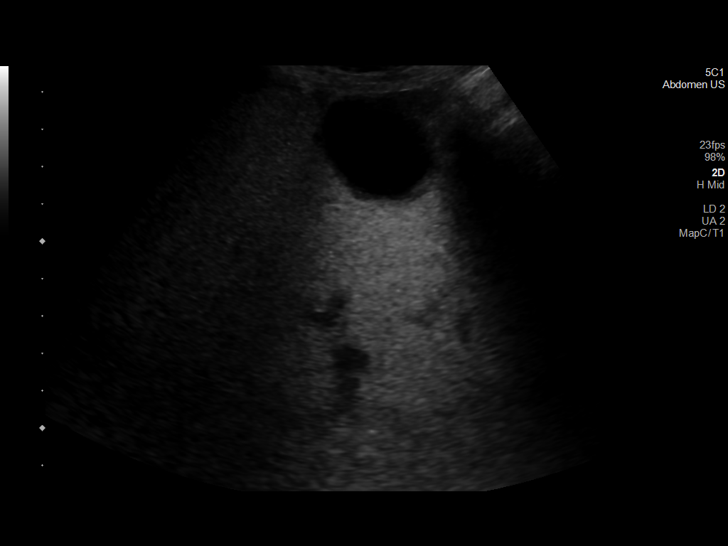
[im 28/42]
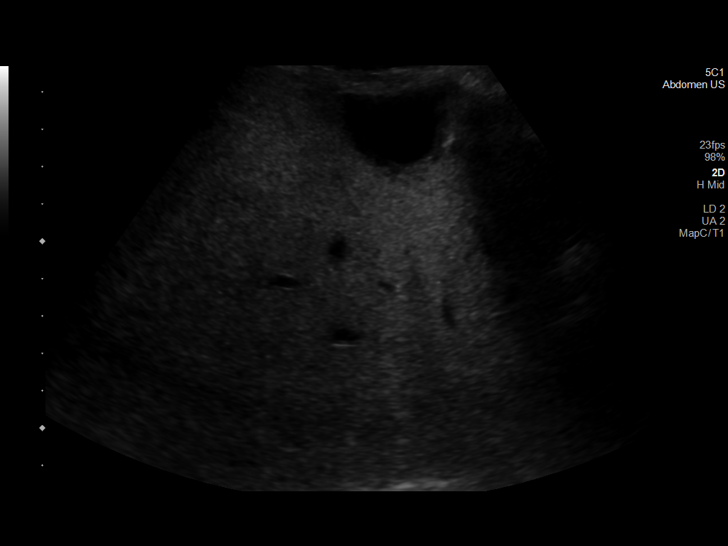
[im 31/42]
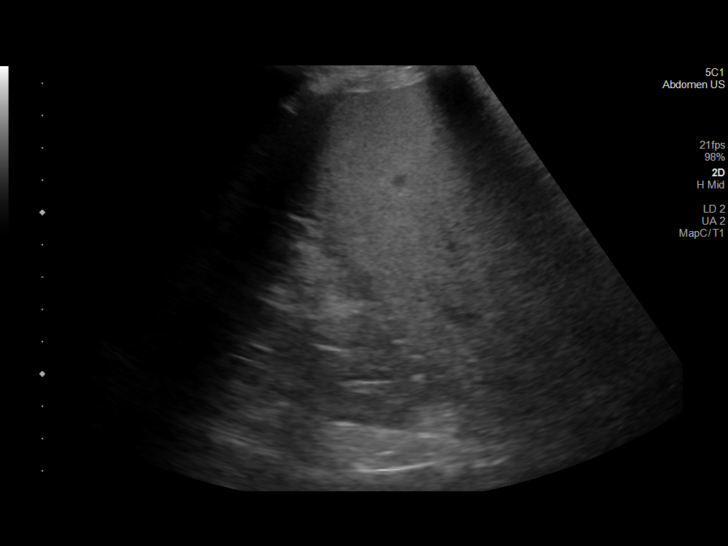
[im 35/42]
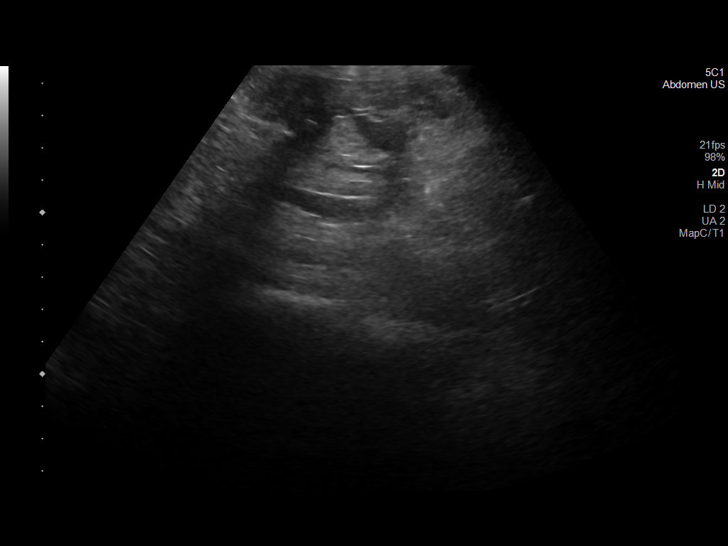
[im 38/42]
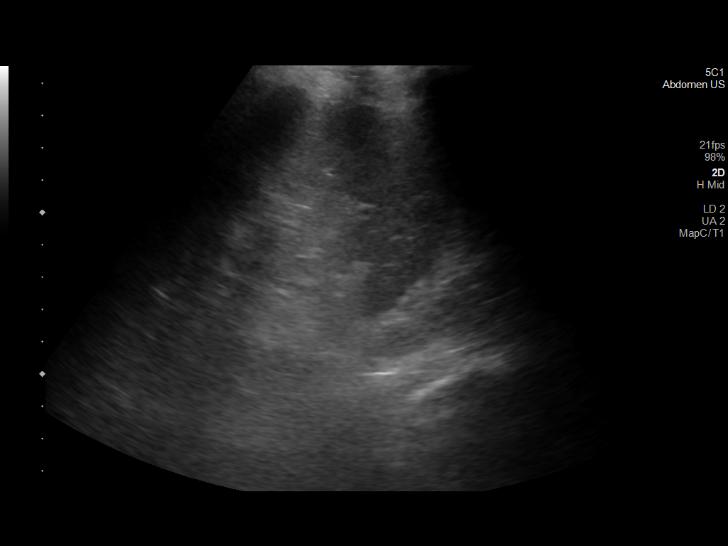
[im 42/42]
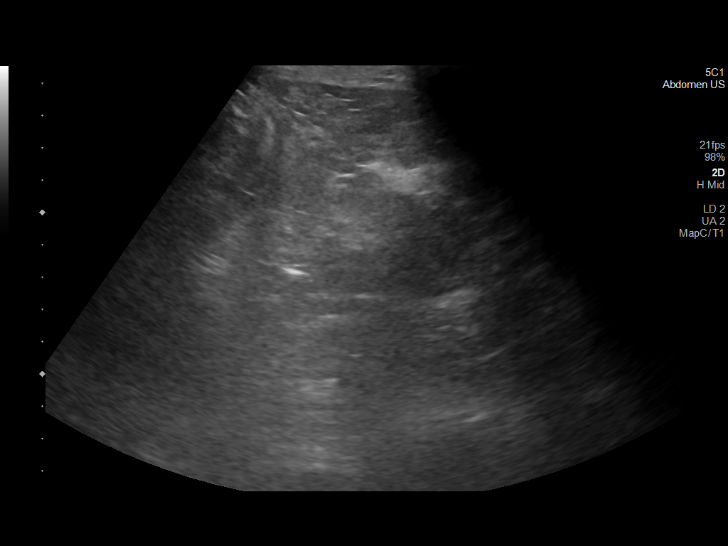

[14 of 25 positions shown; findings below may reference images not displayed]

FINDINGS: Gallbladder:

No gallstones or wall thickening visualized. No sonographic Murphy
sign noted by sonographer.

Common bile duct:

Diameter: Not seen

Liver:

Liver is diffusely echogenic indicating fatty infiltration. No focal
mass or lesion is seen within the liver. Portal vein is patent on
color Doppler imaging with normal direction of blood flow towards
the liver.

Other: All 4 quadrants evaluated.  No free fluid is identified.
IMPRESSION: 1. No acute findings. No gallstones. No evidence of acute
cholecystitis. No ascites.
2. Fatty infiltration of the liver.
3. Common bile duct is not seen, presumably nondistended.

## 2021-05-15 IMAGING — DX DG CHEST 1V PORT
2 series · 2 of 2 positions shown · non-contrast
Comparison: Portable chest 06/18/2020 and earlier.

CLINICAL DATA: 69-year-old male acute on chronic respiratory
failure with hypoxia.

EXAM:
PORTABLE CHEST 1 VIEW

[chest ap (1 of 2)]
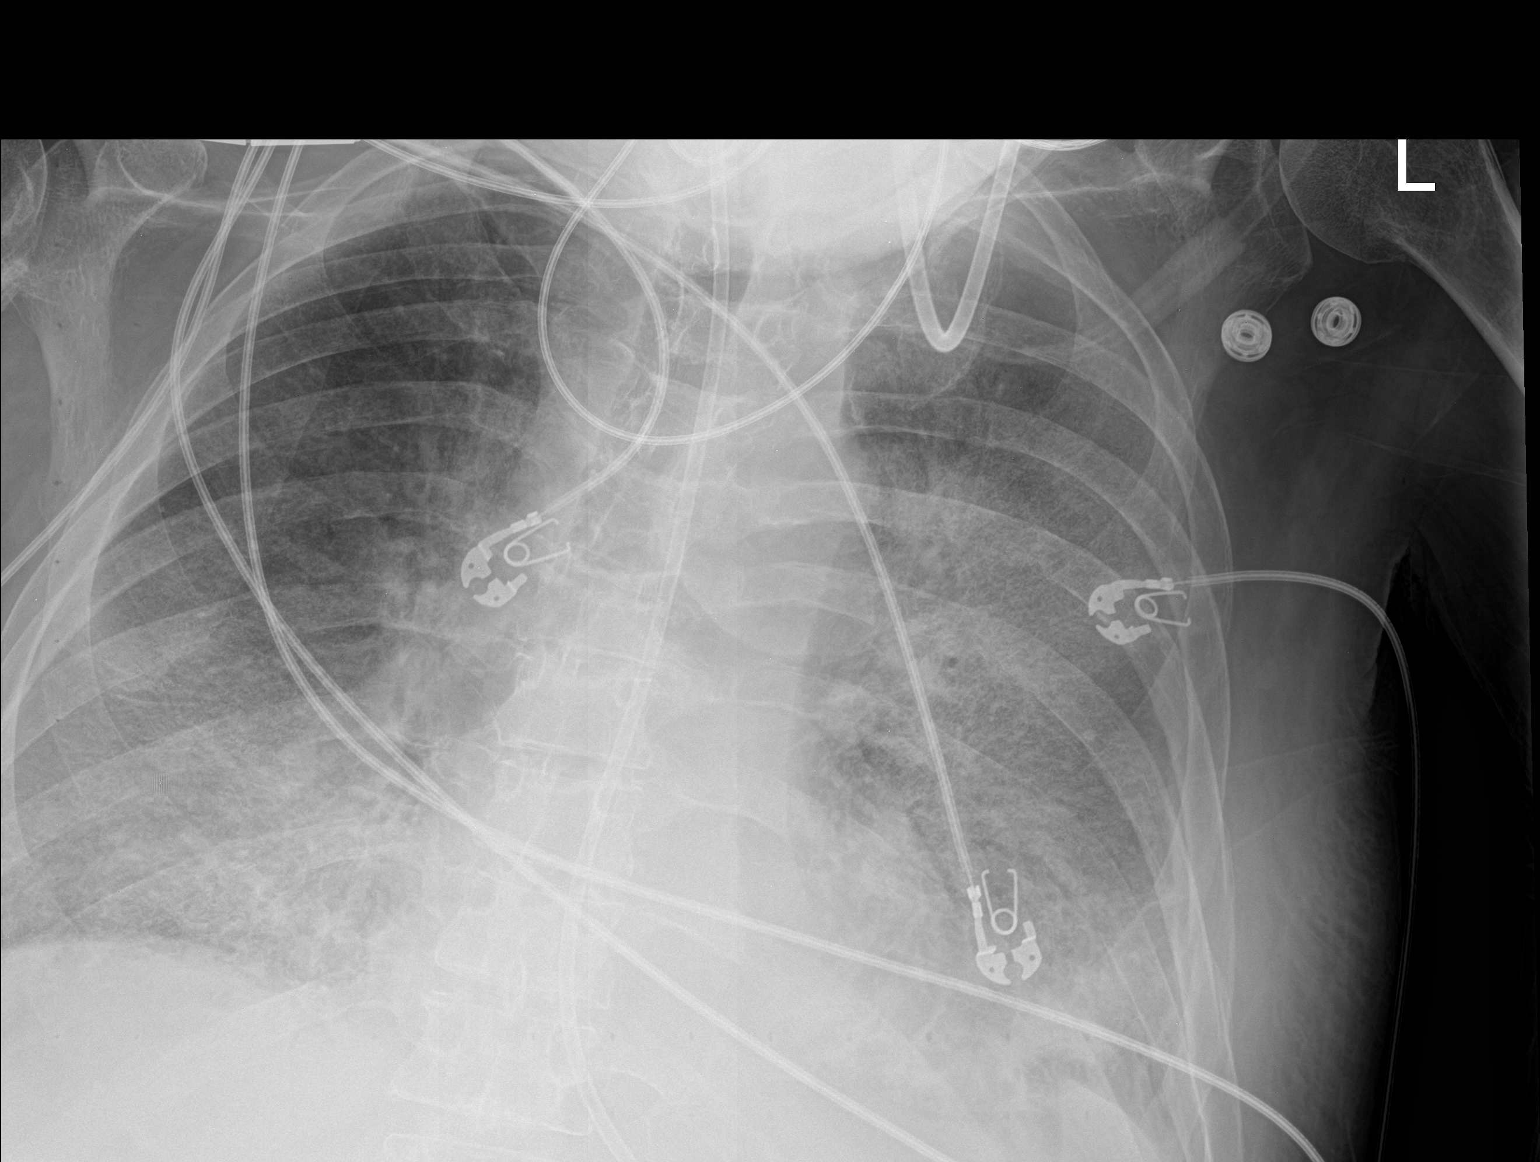

[chest ap (2 of 2)]
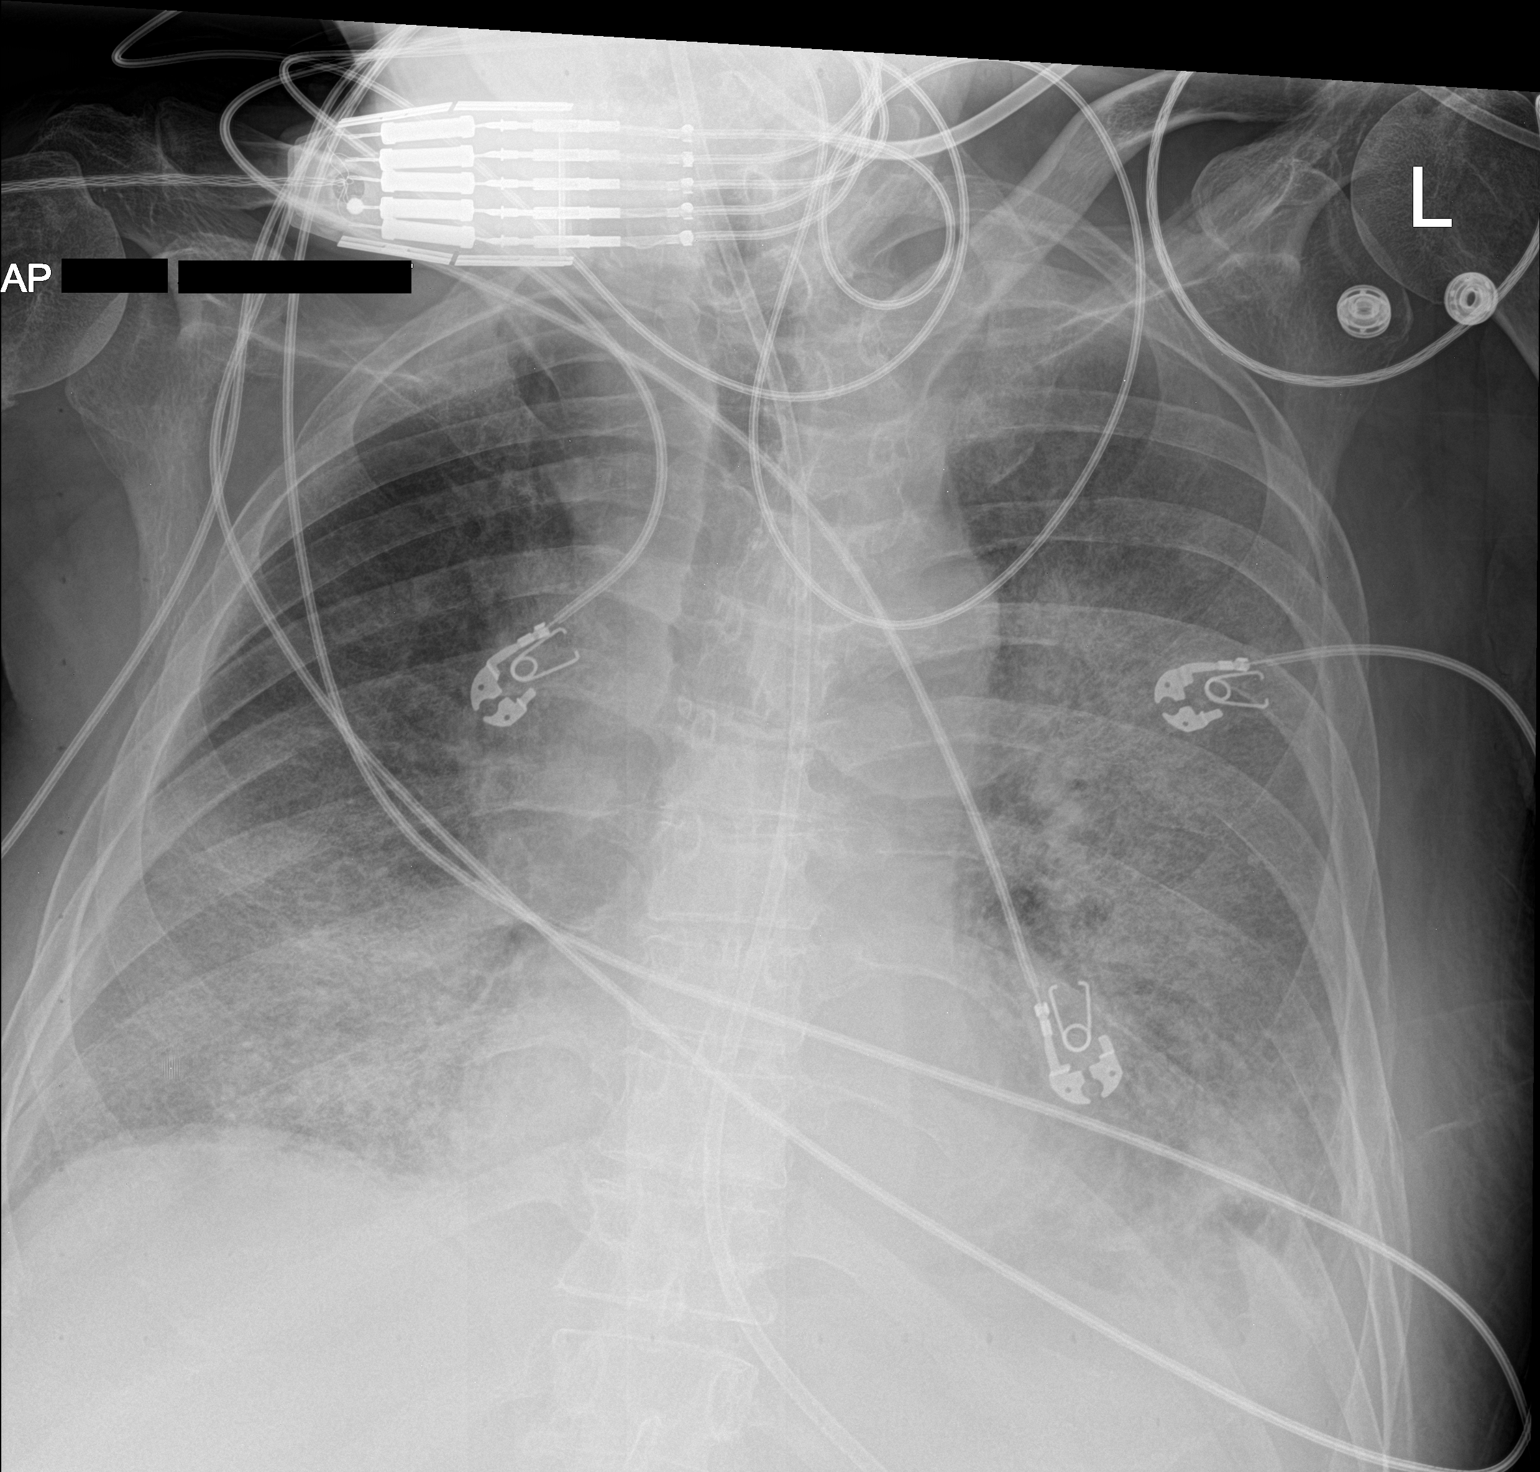

[2 of 2 positions shown; findings below may reference images not displayed]

FINDINGS: Portable AP semi upright view at 2421 hours. Enteric feeding tube
remains in place coursing to the abdomen. Stable lung volumes and
mediastinal contours. No definite cardiomegaly.

Diffuse pulmonary reticulonodular opacity now, with less pronounced
airspace appearing opacity at both lung bases. Right upper lung most
spared as before. No pneumothorax. Small left pleural effusion
difficult to exclude.
IMPRESSION: Evolution from basilar predominant airspace opacity to more diffuse
bilateral reticulonodular interstitial opacity since 06/18/2020.
Unclear whether this represents diffuse infection or pulmonary
edema.
Right upper lung least affected as before.
Small left pleural effusion is possible.
